# Patient Record
Sex: Female | Born: 1965
Health system: Southern US, Community
[De-identification: ages and names within clinical notes are randomized; demographics above are authoritative.]

## PROBLEM LIST (undated history)

## (undated) DIAGNOSIS — L0291 Cutaneous abscess, unspecified: Secondary | ICD-10-CM

## (undated) DIAGNOSIS — M797 Fibromyalgia: Secondary | ICD-10-CM

## (undated) DIAGNOSIS — F32A Depression, unspecified: Secondary | ICD-10-CM

## (undated) DIAGNOSIS — J439 Emphysema, unspecified: Secondary | ICD-10-CM

## (undated) DIAGNOSIS — N83209 Unspecified ovarian cyst, unspecified side: Secondary | ICD-10-CM

## (undated) DIAGNOSIS — Z87442 Personal history of urinary calculi: Secondary | ICD-10-CM

## (undated) DIAGNOSIS — M549 Dorsalgia, unspecified: Secondary | ICD-10-CM

## (undated) DIAGNOSIS — G43909 Migraine, unspecified, not intractable, without status migrainosus: Secondary | ICD-10-CM

## (undated) DIAGNOSIS — J449 Chronic obstructive pulmonary disease, unspecified: Secondary | ICD-10-CM

## (undated) DIAGNOSIS — M1990 Unspecified osteoarthritis, unspecified site: Secondary | ICD-10-CM

## (undated) DIAGNOSIS — F419 Anxiety disorder, unspecified: Secondary | ICD-10-CM

## (undated) DIAGNOSIS — F319 Bipolar disorder, unspecified: Secondary | ICD-10-CM

## (undated) DIAGNOSIS — R918 Other nonspecific abnormal finding of lung field: Secondary | ICD-10-CM

## (undated) DIAGNOSIS — G8929 Other chronic pain: Secondary | ICD-10-CM

## (undated) DIAGNOSIS — R0600 Dyspnea, unspecified: Secondary | ICD-10-CM

## (undated) DIAGNOSIS — N809 Endometriosis, unspecified: Secondary | ICD-10-CM

## (undated) DIAGNOSIS — J42 Unspecified chronic bronchitis: Secondary | ICD-10-CM

## (undated) DIAGNOSIS — F329 Major depressive disorder, single episode, unspecified: Secondary | ICD-10-CM

## (undated) DIAGNOSIS — J849 Interstitial pulmonary disease, unspecified: Secondary | ICD-10-CM

## (undated) HISTORY — DX: Other nonspecific abnormal finding of lung field: R91.8

## (undated) HISTORY — DX: Cutaneous abscess, unspecified: L02.91

## (undated) HISTORY — PX: OVARIAN CYST REMOVAL: SHX89

## (undated) HISTORY — DX: Chronic obstructive pulmonary disease, unspecified: J44.9

## (undated) HISTORY — DX: Dyspnea, unspecified: R06.00

## (undated) HISTORY — DX: Interstitial pulmonary disease, unspecified: J84.9

---

## 1991-11-02 HISTORY — PX: TUBAL LIGATION: SHX77

## 1991-11-02 HISTORY — PX: VAGINAL HYSTERECTOMY: SUR661

## 1991-11-02 HISTORY — PX: APPENDECTOMY: SHX54

## 1999-01-01 ENCOUNTER — Encounter: Payer: Self-pay | Admitting: Family Medicine

## 1999-01-01 ENCOUNTER — Ambulatory Visit (HOSPITAL_COMMUNITY): Admission: RE | Admit: 1999-01-01 | Discharge: 1999-01-01 | Payer: Self-pay | Admitting: Family Medicine

## 1999-03-20 ENCOUNTER — Other Ambulatory Visit: Admission: RE | Admit: 1999-03-20 | Discharge: 1999-03-20 | Payer: Self-pay | Admitting: Gynecology

## 1999-03-23 ENCOUNTER — Emergency Department (HOSPITAL_COMMUNITY): Admission: EM | Admit: 1999-03-23 | Discharge: 1999-03-23 | Payer: Self-pay

## 1999-08-07 ENCOUNTER — Ambulatory Visit (HOSPITAL_COMMUNITY): Admission: RE | Admit: 1999-08-07 | Discharge: 1999-08-07 | Payer: Self-pay | Admitting: Family Medicine

## 1999-08-07 ENCOUNTER — Encounter: Payer: Self-pay | Admitting: Family Medicine

## 1999-08-09 ENCOUNTER — Emergency Department (HOSPITAL_COMMUNITY): Admission: EM | Admit: 1999-08-09 | Discharge: 1999-08-09 | Payer: Self-pay | Admitting: Emergency Medicine

## 1999-08-27 ENCOUNTER — Ambulatory Visit (HOSPITAL_COMMUNITY): Admission: RE | Admit: 1999-08-27 | Discharge: 1999-08-27 | Payer: Self-pay | Admitting: Family Medicine

## 1999-08-27 ENCOUNTER — Encounter: Payer: Self-pay | Admitting: Family Medicine

## 1999-09-05 ENCOUNTER — Inpatient Hospital Stay (HOSPITAL_COMMUNITY): Admission: EM | Admit: 1999-09-05 | Discharge: 1999-09-07 | Payer: Self-pay | Admitting: Psychiatry

## 2001-08-03 ENCOUNTER — Emergency Department (HOSPITAL_COMMUNITY): Admission: EM | Admit: 2001-08-03 | Discharge: 2001-08-03 | Payer: Self-pay | Admitting: Emergency Medicine

## 2001-08-03 ENCOUNTER — Encounter: Payer: Self-pay | Admitting: Emergency Medicine

## 2001-08-04 ENCOUNTER — Emergency Department (HOSPITAL_COMMUNITY): Admission: EM | Admit: 2001-08-04 | Discharge: 2001-08-04 | Payer: Self-pay | Admitting: Emergency Medicine

## 2001-08-04 ENCOUNTER — Encounter: Payer: Self-pay | Admitting: *Deleted

## 2001-08-07 ENCOUNTER — Encounter (HOSPITAL_BASED_OUTPATIENT_CLINIC_OR_DEPARTMENT_OTHER): Payer: Self-pay | Admitting: General Surgery

## 2001-08-08 ENCOUNTER — Encounter (INDEPENDENT_AMBULATORY_CARE_PROVIDER_SITE_OTHER): Payer: Self-pay | Admitting: *Deleted

## 2001-08-08 ENCOUNTER — Ambulatory Visit (HOSPITAL_COMMUNITY): Admission: RE | Admit: 2001-08-08 | Discharge: 2001-08-09 | Payer: Self-pay | Admitting: General Surgery

## 2001-08-08 ENCOUNTER — Encounter (HOSPITAL_BASED_OUTPATIENT_CLINIC_OR_DEPARTMENT_OTHER): Payer: Self-pay | Admitting: General Surgery

## 2002-01-26 ENCOUNTER — Encounter: Admission: RE | Admit: 2002-01-26 | Discharge: 2002-01-26 | Payer: Self-pay | Admitting: Family Medicine

## 2002-01-26 ENCOUNTER — Encounter: Payer: Self-pay | Admitting: Family Medicine

## 2002-04-25 ENCOUNTER — Emergency Department (HOSPITAL_COMMUNITY): Admission: EM | Admit: 2002-04-25 | Discharge: 2002-04-25 | Payer: Self-pay | Admitting: Emergency Medicine

## 2002-07-18 ENCOUNTER — Other Ambulatory Visit: Admission: RE | Admit: 2002-07-18 | Discharge: 2002-07-18 | Payer: Self-pay | Admitting: Obstetrics and Gynecology

## 2002-07-27 ENCOUNTER — Encounter: Payer: Self-pay | Admitting: Emergency Medicine

## 2002-07-27 ENCOUNTER — Emergency Department (HOSPITAL_COMMUNITY): Admission: EM | Admit: 2002-07-27 | Discharge: 2002-07-28 | Payer: Self-pay | Admitting: Emergency Medicine

## 2003-01-13 ENCOUNTER — Emergency Department (HOSPITAL_COMMUNITY): Admission: EM | Admit: 2003-01-13 | Discharge: 2003-01-13 | Payer: Self-pay | Admitting: Emergency Medicine

## 2003-01-13 ENCOUNTER — Encounter: Payer: Self-pay | Admitting: Emergency Medicine

## 2003-07-12 ENCOUNTER — Encounter: Payer: Self-pay | Admitting: Obstetrics and Gynecology

## 2003-07-12 ENCOUNTER — Encounter: Admission: RE | Admit: 2003-07-12 | Discharge: 2003-07-12 | Payer: Self-pay | Admitting: Obstetrics and Gynecology

## 2004-02-07 ENCOUNTER — Ambulatory Visit (HOSPITAL_COMMUNITY): Admission: RE | Admit: 2004-02-07 | Discharge: 2004-02-07 | Payer: Self-pay | Admitting: Orthopedic Surgery

## 2004-03-03 ENCOUNTER — Emergency Department (HOSPITAL_COMMUNITY): Admission: EM | Admit: 2004-03-03 | Discharge: 2004-03-03 | Payer: Self-pay | Admitting: Family Medicine

## 2004-05-28 ENCOUNTER — Emergency Department (HOSPITAL_COMMUNITY): Admission: EM | Admit: 2004-05-28 | Discharge: 2004-05-28 | Payer: Self-pay | Admitting: Emergency Medicine

## 2004-08-15 ENCOUNTER — Emergency Department (HOSPITAL_COMMUNITY): Admission: EM | Admit: 2004-08-15 | Discharge: 2004-08-15 | Payer: Self-pay | Admitting: Emergency Medicine

## 2004-09-28 ENCOUNTER — Emergency Department (HOSPITAL_COMMUNITY): Admission: EM | Admit: 2004-09-28 | Discharge: 2004-09-29 | Payer: Self-pay | Admitting: Emergency Medicine

## 2004-10-02 ENCOUNTER — Ambulatory Visit: Payer: Self-pay | Admitting: Psychiatry

## 2004-10-02 ENCOUNTER — Inpatient Hospital Stay (HOSPITAL_COMMUNITY): Admission: RE | Admit: 2004-10-02 | Discharge: 2004-10-06 | Payer: Self-pay | Admitting: Psychiatry

## 2004-11-21 ENCOUNTER — Emergency Department (HOSPITAL_COMMUNITY): Admission: EM | Admit: 2004-11-21 | Discharge: 2004-11-21 | Payer: Self-pay | Admitting: Emergency Medicine

## 2004-12-09 ENCOUNTER — Emergency Department (HOSPITAL_COMMUNITY): Admission: EM | Admit: 2004-12-09 | Discharge: 2004-12-09 | Payer: Self-pay | Admitting: Emergency Medicine

## 2005-04-07 ENCOUNTER — Ambulatory Visit: Payer: Self-pay | Admitting: Internal Medicine

## 2005-04-13 ENCOUNTER — Ambulatory Visit: Payer: Self-pay | Admitting: Internal Medicine

## 2005-04-19 ENCOUNTER — Encounter (INDEPENDENT_AMBULATORY_CARE_PROVIDER_SITE_OTHER): Payer: Self-pay | Admitting: *Deleted

## 2005-04-19 ENCOUNTER — Ambulatory Visit (HOSPITAL_COMMUNITY): Admission: RE | Admit: 2005-04-19 | Discharge: 2005-04-19 | Payer: Self-pay | Admitting: Gastroenterology

## 2005-04-29 ENCOUNTER — Ambulatory Visit (HOSPITAL_COMMUNITY): Admission: RE | Admit: 2005-04-29 | Discharge: 2005-04-29 | Payer: Self-pay | Admitting: Gastroenterology

## 2005-05-31 ENCOUNTER — Ambulatory Visit (HOSPITAL_COMMUNITY): Admission: RE | Admit: 2005-05-31 | Discharge: 2005-05-31 | Payer: Self-pay | Admitting: Gastroenterology

## 2005-06-14 ENCOUNTER — Ambulatory Visit (HOSPITAL_COMMUNITY): Admission: RE | Admit: 2005-06-14 | Discharge: 2005-06-14 | Payer: Self-pay | Admitting: Gastroenterology

## 2005-08-17 ENCOUNTER — Ambulatory Visit: Payer: Self-pay | Admitting: Internal Medicine

## 2005-09-15 ENCOUNTER — Ambulatory Visit: Payer: Self-pay | Admitting: Internal Medicine

## 2005-11-16 ENCOUNTER — Ambulatory Visit: Payer: Self-pay | Admitting: Internal Medicine

## 2005-11-18 ENCOUNTER — Ambulatory Visit (HOSPITAL_COMMUNITY): Admission: RE | Admit: 2005-11-18 | Discharge: 2005-11-18 | Payer: Self-pay | Admitting: Internal Medicine

## 2005-11-22 ENCOUNTER — Ambulatory Visit: Payer: Self-pay | Admitting: Internal Medicine

## 2006-01-31 ENCOUNTER — Encounter: Admission: RE | Admit: 2006-01-31 | Discharge: 2006-01-31 | Payer: Self-pay | Admitting: Internal Medicine

## 2006-03-15 ENCOUNTER — Ambulatory Visit: Payer: Self-pay | Admitting: Internal Medicine

## 2006-03-21 ENCOUNTER — Ambulatory Visit: Payer: Self-pay | Admitting: *Deleted

## 2006-03-24 ENCOUNTER — Emergency Department (HOSPITAL_COMMUNITY): Admission: EM | Admit: 2006-03-24 | Discharge: 2006-03-24 | Payer: Self-pay | Admitting: Emergency Medicine

## 2006-04-08 ENCOUNTER — Emergency Department (HOSPITAL_COMMUNITY): Admission: EM | Admit: 2006-04-08 | Discharge: 2006-04-08 | Payer: Self-pay | Admitting: Emergency Medicine

## 2006-04-11 ENCOUNTER — Ambulatory Visit: Payer: Self-pay | Admitting: Internal Medicine

## 2006-04-16 ENCOUNTER — Emergency Department (HOSPITAL_COMMUNITY): Admission: EM | Admit: 2006-04-16 | Discharge: 2006-04-16 | Payer: Self-pay | Admitting: Family Medicine

## 2006-06-27 ENCOUNTER — Ambulatory Visit: Payer: Self-pay | Admitting: Internal Medicine

## 2006-06-27 ENCOUNTER — Ambulatory Visit (HOSPITAL_COMMUNITY): Admission: RE | Admit: 2006-06-27 | Discharge: 2006-06-27 | Payer: Self-pay | Admitting: Internal Medicine

## 2006-07-08 ENCOUNTER — Ambulatory Visit: Payer: Self-pay | Admitting: Internal Medicine

## 2006-08-24 ENCOUNTER — Ambulatory Visit: Payer: Self-pay | Admitting: Family Medicine

## 2006-08-29 ENCOUNTER — Ambulatory Visit (HOSPITAL_COMMUNITY): Admission: RE | Admit: 2006-08-29 | Discharge: 2006-08-29 | Payer: Self-pay | Admitting: Hematology and Oncology

## 2006-09-01 ENCOUNTER — Ambulatory Visit: Payer: Self-pay | Admitting: Internal Medicine

## 2006-11-01 HISTORY — PX: BREAST LUMPECTOMY: SHX2

## 2006-11-01 HISTORY — PX: BREAST BIOPSY: SHX20

## 2007-01-17 ENCOUNTER — Ambulatory Visit: Payer: Self-pay | Admitting: Internal Medicine

## 2007-01-24 ENCOUNTER — Encounter: Admission: RE | Admit: 2007-01-24 | Discharge: 2007-02-02 | Payer: Self-pay | Admitting: Internal Medicine

## 2007-02-17 ENCOUNTER — Ambulatory Visit: Payer: Self-pay | Admitting: Internal Medicine

## 2007-04-21 ENCOUNTER — Ambulatory Visit: Payer: Self-pay | Admitting: Internal Medicine

## 2007-06-14 ENCOUNTER — Ambulatory Visit: Payer: Self-pay | Admitting: Family Medicine

## 2007-06-15 ENCOUNTER — Encounter (INDEPENDENT_AMBULATORY_CARE_PROVIDER_SITE_OTHER): Payer: Self-pay | Admitting: Nurse Practitioner

## 2007-06-26 DIAGNOSIS — G4700 Insomnia, unspecified: Secondary | ICD-10-CM | POA: Insufficient documentation

## 2007-06-26 DIAGNOSIS — N3000 Acute cystitis without hematuria: Secondary | ICD-10-CM | POA: Insufficient documentation

## 2007-06-26 DIAGNOSIS — IMO0001 Reserved for inherently not codable concepts without codable children: Secondary | ICD-10-CM | POA: Insufficient documentation

## 2007-06-26 DIAGNOSIS — F319 Bipolar disorder, unspecified: Secondary | ICD-10-CM | POA: Insufficient documentation

## 2007-07-25 ENCOUNTER — Ambulatory Visit: Payer: Self-pay | Admitting: Internal Medicine

## 2007-08-02 HISTORY — PX: LAPAROSCOPIC CHOLECYSTECTOMY: SUR755

## 2007-09-19 ENCOUNTER — Ambulatory Visit: Payer: Self-pay | Admitting: Internal Medicine

## 2007-09-19 DIAGNOSIS — B9789 Other viral agents as the cause of diseases classified elsewhere: Secondary | ICD-10-CM | POA: Insufficient documentation

## 2007-09-19 DIAGNOSIS — N3946 Mixed incontinence: Secondary | ICD-10-CM | POA: Insufficient documentation

## 2007-09-19 LAB — CONVERTED CEMR LAB
Bilirubin Urine: NEGATIVE
Glucose, Urine, Semiquant: NEGATIVE
Ketones, urine, test strip: NEGATIVE
Nitrite: NEGATIVE
Protein, U semiquant: 30
Specific Gravity, Urine: >=1.03
Urobilinogen, UA: NEGATIVE
WBC Urine, dipstick: NEGATIVE
pH: 6

## 2007-09-20 ENCOUNTER — Encounter (INDEPENDENT_AMBULATORY_CARE_PROVIDER_SITE_OTHER): Payer: Self-pay | Admitting: Internal Medicine

## 2007-09-26 ENCOUNTER — Telehealth (INDEPENDENT_AMBULATORY_CARE_PROVIDER_SITE_OTHER): Payer: Self-pay | Admitting: Internal Medicine

## 2007-09-26 ENCOUNTER — Encounter (INDEPENDENT_AMBULATORY_CARE_PROVIDER_SITE_OTHER): Payer: Self-pay | Admitting: Internal Medicine

## 2007-10-06 ENCOUNTER — Encounter (INDEPENDENT_AMBULATORY_CARE_PROVIDER_SITE_OTHER): Payer: Self-pay | Admitting: Internal Medicine

## 2007-11-24 ENCOUNTER — Ambulatory Visit: Payer: Self-pay | Admitting: Internal Medicine

## 2008-02-12 ENCOUNTER — Ambulatory Visit: Payer: Self-pay | Admitting: Family Medicine

## 2008-02-13 ENCOUNTER — Ambulatory Visit: Payer: Self-pay | Admitting: *Deleted

## 2008-02-27 ENCOUNTER — Telehealth (INDEPENDENT_AMBULATORY_CARE_PROVIDER_SITE_OTHER): Payer: Self-pay | Admitting: Internal Medicine

## 2008-03-20 ENCOUNTER — Telehealth (INDEPENDENT_AMBULATORY_CARE_PROVIDER_SITE_OTHER): Payer: Self-pay | Admitting: Internal Medicine

## 2008-04-03 ENCOUNTER — Emergency Department (HOSPITAL_COMMUNITY): Admission: EM | Admit: 2008-04-03 | Discharge: 2008-04-03 | Payer: Self-pay | Admitting: Family Medicine

## 2008-08-21 ENCOUNTER — Ambulatory Visit: Payer: Self-pay | Admitting: *Deleted

## 2008-08-21 ENCOUNTER — Emergency Department (HOSPITAL_COMMUNITY): Admission: EM | Admit: 2008-08-21 | Discharge: 2008-08-21 | Payer: Self-pay | Admitting: Emergency Medicine

## 2008-08-21 ENCOUNTER — Inpatient Hospital Stay (HOSPITAL_COMMUNITY): Admission: RE | Admit: 2008-08-21 | Discharge: 2008-08-22 | Payer: Self-pay | Admitting: *Deleted

## 2008-09-01 HISTORY — PX: TRANSOBTURATOR SLING: SHX2571

## 2008-09-04 ENCOUNTER — Ambulatory Visit (HOSPITAL_COMMUNITY): Admission: RE | Admit: 2008-09-04 | Discharge: 2008-09-04 | Payer: Self-pay | Admitting: Obstetrics and Gynecology

## 2008-09-24 ENCOUNTER — Emergency Department (HOSPITAL_COMMUNITY): Admission: EM | Admit: 2008-09-24 | Discharge: 2008-09-24 | Payer: Self-pay | Admitting: Emergency Medicine

## 2009-04-07 ENCOUNTER — Emergency Department (HOSPITAL_COMMUNITY): Admission: EM | Admit: 2009-04-07 | Discharge: 2009-04-07 | Payer: Self-pay | Admitting: Family Medicine

## 2009-08-02 ENCOUNTER — Emergency Department (HOSPITAL_COMMUNITY): Admission: EM | Admit: 2009-08-02 | Discharge: 2009-08-02 | Payer: Self-pay | Admitting: Family Medicine

## 2009-10-29 ENCOUNTER — Ambulatory Visit (HOSPITAL_BASED_OUTPATIENT_CLINIC_OR_DEPARTMENT_OTHER): Admission: RE | Admit: 2009-10-29 | Discharge: 2009-10-29 | Payer: Self-pay | Admitting: General Surgery

## 2009-11-20 ENCOUNTER — Ambulatory Visit: Payer: Self-pay | Admitting: Oncology

## 2009-11-24 LAB — COMPREHENSIVE METABOLIC PANEL
ALT: 19 U/L (ref 0–35)
AST: 15 U/L (ref 0–37)
Albumin: 4.4 g/dL (ref 3.5–5.2)
Alkaline Phosphatase: 19 U/L — ABNORMAL LOW (ref 39–117)
BUN: 6 mg/dL (ref 6–23)
CO2: 26 mEq/L (ref 19–32)
Calcium: 9.4 mg/dL (ref 8.4–10.5)
Chloride: 103 mEq/L (ref 96–112)
Creatinine, Ser: 0.96 mg/dL (ref 0.40–1.20)
Glucose, Bld: 111 mg/dL — ABNORMAL HIGH (ref 70–99)
Potassium: 2.9 mEq/L — ABNORMAL LOW (ref 3.5–5.3)
Sodium: 138 mEq/L (ref 135–145)
Total Bilirubin: 0.3 mg/dL (ref 0.3–1.2)
Total Protein: 7.1 g/dL (ref 6.0–8.3)

## 2009-11-24 LAB — CBC WITH DIFFERENTIAL/PLATELET
BASO%: 0.5 % (ref 0.0–2.0)
Basophils Absolute: 0 10*3/uL (ref 0.0–0.1)
EOS%: 2.1 % (ref 0.0–7.0)
Eosinophils Absolute: 0.2 10*3/uL (ref 0.0–0.5)
HCT: 41.3 % (ref 34.8–46.6)
HGB: 14.1 g/dL (ref 11.6–15.9)
LYMPH%: 21.4 % (ref 14.0–49.7)
MCH: 32.1 pg (ref 25.1–34.0)
MCHC: 34.3 g/dL (ref 31.5–36.0)
MCV: 93.8 fL (ref 79.5–101.0)
MONO#: 0.4 10*3/uL (ref 0.1–0.9)
MONO%: 5 % (ref 0.0–14.0)
NEUT#: 6.1 10*3/uL (ref 1.5–6.5)
NEUT%: 71 % (ref 38.4–76.8)
Platelets: 375 10*3/uL (ref 145–400)
RBC: 4.4 10*6/uL (ref 3.70–5.45)
RDW: 13.6 % (ref 11.2–14.5)
WBC: 8.6 10*3/uL (ref 3.9–10.3)
lymph#: 1.8 10*3/uL (ref 0.9–3.3)

## 2010-01-14 ENCOUNTER — Ambulatory Visit: Payer: Self-pay | Admitting: Oncology

## 2010-01-19 LAB — COMPREHENSIVE METABOLIC PANEL
ALT: 12 U/L (ref 0–35)
AST: 11 U/L (ref 0–37)
Albumin: 4.3 g/dL (ref 3.5–5.2)
Alkaline Phosphatase: 20 U/L — ABNORMAL LOW (ref 39–117)
BUN: 8 mg/dL (ref 6–23)
CO2: 22 mEq/L (ref 19–32)
Calcium: 9.5 mg/dL (ref 8.4–10.5)
Chloride: 105 mEq/L (ref 96–112)
Creatinine, Ser: 0.91 mg/dL (ref 0.40–1.20)
Glucose, Bld: 93 mg/dL (ref 70–99)
Potassium: 4 mEq/L (ref 3.5–5.3)
Sodium: 139 mEq/L (ref 135–145)
Total Bilirubin: 0.3 mg/dL (ref 0.3–1.2)
Total Protein: 6.7 g/dL (ref 6.0–8.3)

## 2010-01-19 LAB — CBC WITH DIFFERENTIAL/PLATELET
BASO%: 0.6 % (ref 0.0–2.0)
Basophils Absolute: 0 10*3/uL (ref 0.0–0.1)
EOS%: 3.5 % (ref 0.0–7.0)
Eosinophils Absolute: 0.2 10*3/uL (ref 0.0–0.5)
HCT: 37.8 % (ref 34.8–46.6)
HGB: 13.2 g/dL (ref 11.6–15.9)
LYMPH%: 29.2 % (ref 14.0–49.7)
MCH: 32.6 pg (ref 25.1–34.0)
MCHC: 34.9 g/dL (ref 31.5–36.0)
MCV: 93.5 fL (ref 79.5–101.0)
MONO#: 0.4 10*3/uL (ref 0.1–0.9)
MONO%: 6.5 % (ref 0.0–14.0)
NEUT#: 4.2 10*3/uL (ref 1.5–6.5)
NEUT%: 60.2 % (ref 38.4–76.8)
Platelets: 337 10*3/uL (ref 145–400)
RBC: 4.04 10*6/uL (ref 3.70–5.45)
RDW: 13.3 % (ref 11.2–14.5)
WBC: 6.9 10*3/uL (ref 3.9–10.3)
lymph#: 2 10*3/uL (ref 0.9–3.3)

## 2010-01-31 ENCOUNTER — Emergency Department (HOSPITAL_COMMUNITY): Admission: EM | Admit: 2010-01-31 | Discharge: 2010-01-31 | Payer: Self-pay | Admitting: Emergency Medicine

## 2010-02-16 ENCOUNTER — Ambulatory Visit: Payer: Self-pay | Admitting: Oncology

## 2010-03-19 ENCOUNTER — Ambulatory Visit: Payer: Self-pay | Admitting: Oncology

## 2010-04-11 ENCOUNTER — Emergency Department (HOSPITAL_COMMUNITY): Admission: EM | Admit: 2010-04-11 | Discharge: 2010-04-11 | Payer: Self-pay | Admitting: Emergency Medicine

## 2010-04-23 ENCOUNTER — Ambulatory Visit: Payer: Self-pay | Admitting: Oncology

## 2010-05-28 ENCOUNTER — Ambulatory Visit: Payer: Self-pay | Admitting: Oncology

## 2010-06-01 HISTORY — PX: LAPAROSCOPIC LYSIS OF ADHESIONS: SHX5905

## 2010-06-01 LAB — CBC WITH DIFFERENTIAL/PLATELET
BASO%: 0.6 % (ref 0.0–2.0)
Basophils Absolute: 0 10*3/uL (ref 0.0–0.1)
EOS%: 2.1 % (ref 0.0–7.0)
Eosinophils Absolute: 0.2 10*3/uL (ref 0.0–0.5)
HCT: 39.5 % (ref 34.8–46.6)
HGB: 13.6 g/dL (ref 11.6–15.9)
LYMPH%: 29.7 % (ref 14.0–49.7)
MCH: 31.8 pg (ref 25.1–34.0)
MCHC: 34.5 g/dL (ref 31.5–36.0)
MCV: 92.4 fL (ref 79.5–101.0)
MONO#: 0.5 10*3/uL (ref 0.1–0.9)
MONO%: 6.6 % (ref 0.0–14.0)
NEUT#: 4.7 10*3/uL (ref 1.5–6.5)
NEUT%: 61 % (ref 38.4–76.8)
Platelets: 350 10*3/uL (ref 145–400)
RBC: 4.28 10*6/uL (ref 3.70–5.45)
RDW: 13.3 % (ref 11.2–14.5)
WBC: 7.8 10*3/uL (ref 3.9–10.3)
lymph#: 2.3 10*3/uL (ref 0.9–3.3)

## 2010-06-01 LAB — COMPREHENSIVE METABOLIC PANEL
ALT: 10 U/L (ref 0–35)
AST: 10 U/L (ref 0–37)
Albumin: 4.4 g/dL (ref 3.5–5.2)
Alkaline Phosphatase: 21 U/L — ABNORMAL LOW (ref 39–117)
BUN: 11 mg/dL (ref 6–23)
CO2: 22 mEq/L (ref 19–32)
Calcium: 9.5 mg/dL (ref 8.4–10.5)
Chloride: 105 mEq/L (ref 96–112)
Creatinine, Ser: 0.9 mg/dL (ref 0.40–1.20)
Glucose, Bld: 88 mg/dL (ref 70–99)
Potassium: 3.8 mEq/L (ref 3.5–5.3)
Sodium: 140 mEq/L (ref 135–145)
Total Bilirubin: 0.2 mg/dL — ABNORMAL LOW (ref 0.3–1.2)
Total Protein: 6.5 g/dL (ref 6.0–8.3)

## 2010-06-18 ENCOUNTER — Ambulatory Visit (HOSPITAL_COMMUNITY): Admission: RE | Admit: 2010-06-18 | Discharge: 2010-06-18 | Payer: Self-pay | Admitting: Obstetrics and Gynecology

## 2010-11-22 ENCOUNTER — Encounter: Payer: Self-pay | Admitting: Internal Medicine

## 2010-11-22 ENCOUNTER — Encounter: Payer: Self-pay | Admitting: Occupational Therapy

## 2010-12-03 NOTE — Letter (Signed)
Summary: FAXED RECORDS TO [facility] 10/06/07  FAXED RECORDS TO [facility] 10/06/07   Imported By: Silvio Pate Stanislawscyk 10/06/2007 16:25:38  _____________________________________________________________________  External Attachment:    Type:   Image     Comment:   External Document

## 2010-12-03 NOTE — Assessment & Plan Note (Signed)
Summary: 6 WEEK FU ON MEDICATION////KT   Vital Signs:  Patient Profile:   45 Years Old Female Weight:      182 pounds Pulse rate:   84 / minute Pulse rhythm:   regular Resp:     16 per minute BP sitting:   96 / 68  (left arm) Cuff size:   regular  Pt. in pain?   yes    Location:   headache    Intensity:   8  Vitals Entered By: Vesta Mixer CMA (September 19, 2007 2:09 PM)                  Chief Complaint:  6 week f/u, also N & V x 2weeks, st, and cough.  History of Present Illness: 1.  Fibromyalgia:  Mild improvement with increased Lyrica.  Has not looked into pool exercises--may have access to pool as friend is a member.  2.  3 days ago with nausea and vomiting, cough-nonproductive, no fevers, +chills, +headache.  No diarrhea.  Does have myalgias more than usual status.  Tylenol Cold does not help.  Drinking regualar tea.  Feels like she can't keep anything down.  Urinated last this morning--dark yellow, good amt.  3.  For past month, when coughs or stands up, loses control of urine.  No dysuria, +urine frequency.  Current Allergies (reviewed today): ! CORTISONE ACETATE (CORTISONE ACETATE)      Physical Exam  Eyes:     No corneal or conjunctival inflammation noted. EOMI. Perrla. Funduscopic exam benign, without hemorrhages, exudates or papilledema. Vision grossly normal. Ears:     External ear exam shows no significant lesions or deformities.  Otoscopic examination reveals clear canals, tympanic membranes are intact bilaterally without bulging, retraction, inflammation or discharge. Hearing is grossly normal bilaterally. Nose:     mucosal erythema and mucosal edema.   Mouth:     Mouth breathing--mouth is dry.  No pharyngeal exudates. Neck:     No deformities, masses, or tenderness noted. Lungs:     Normal respiratory effort, chest expands symmetrically. Lungs are clear to auscultation, no crackles or wheezes. Heart:     Normal rate and regular rhythm. S1 and  S2 normal without gallop, murmur, click, rub or other extra sounds. Abdomen:     Bowel sounds positive,abdomen soft and non-tender without masses, organomegaly or hernias noted.    Impression & Recommendations:  Problem # 1:  VIRAL INFECTION (ICD-079.99) Phenergan suppositories q6h as needed Small amts of clear liquids regularly. Call if unable to keep fluids down over next 24 hours.  Problem # 2:  MIXED INCONTINENCE URGE AND STRESS (EAV-409.81)  Orders: UA Dipstick w/o Micro (19147) T-Culture, Urine ([id number])   Problem # 3:  FIBROMYALGIA (ICD-729.1) Continue on current meds--pt. needs to get started on regular exercise program, hopefully with pool. Her updated medication list for this problem includes:    Carisoprodol 350 Mg Tabs (Carisoprodol) .Marland Kitchen... 1 by mouth three times a day as needed muscle painb   Complete Medication List: 1)  Abilify 10 Mg Tabs (Aripiprazole) 2)  Seroquel 200 Mg Tabs (Quetiapine fumarate) .... Take 1 tablet by mouth every morning take 1 tablet at bedtime 3)  Lexapro 20 Mg Tabs (Escitalopram oxalate) .... Take 1 tablet by mouth once a day 4)  Lamictal 200 Mg Tabs (Lamotrigine) .Marland Kitchen.. 1 tablet by mouth daily 5)  Ativan 2 Mg/ml Soln (Lorazepam) 6)  Lyrica 75 Mg Caps (Pregabalin) .Marland Kitchen.. 1 cap by mouth bid 7)  Carisoprodol 350 Mg  Tabs (Carisoprodol) .Marland Kitchen.. 1 by mouth three times a day as needed muscle painb 8)  Promethegan 25 Mg Supp (Promethazine hcl) .Marland Kitchen.. 1 pr q6h as needed for nausea and vomiting.   Patient Instructions: 1)  Use a suppository upon reaching home 2)  Start sipping on clear liquids--small amts. frequently 3)  Call if unable to keep fluids down over next 24 hours. 4)  Follow up in 2 months after starting exercises-call for appt.    Prescriptions: PROMETHEGAN 25 MG  SUPP (PROMETHAZINE HCL) 1 pr q6h as needed for nausea and vomiting.  #6 x 0   Entered and Authorized by:   Julieanne Manson MD   Signed by:   Julieanne Manson MD on  09/19/2007   Method used:   Print then Give to Patient   RxID:   [id number]  ] Laboratory Results   Urine Tests    Routine Urinalysis   Color: yellow Appearance: Clear Glucose: negative   (Normal Range: Negative) Bilirubin: negative   (Normal Range: Negative) Ketone: negative   (Normal Range: Negative) Spec. Gravity: >=1.030   (Normal Range: 1.003-1.035) Blood: large   (Normal Range: Negative) pH: 6.0   (Normal Range: 5.0-8.0) Protein: 30   (Normal Range: Negative) Urobilinogen: negative   (Normal Range: 0-1) Nitrite: negative   (Normal Range: Negative) Leukocyte Esterace: negative   (Normal Range: Negative)        Appended Document: 6 WEEK FU ON MEDICATION////KT Pt. requested cough medicine. Instructed to keep 6 hours between Rondec and Phenergan suppositories.   Clinical Lists Changes  Medications: Added new medication of RONDEC DM 12.03-04-14 MG/5ML  SYRP (PHENYLEPHRINE-CHLORPHEN-DM) 1 tsp by mouth q 6h as needed cough - Signed Rx of RONDEC DM 12.03-04-14 MG/5ML  SYRP (PHENYLEPHRINE-CHLORPHEN-DM) 1 tsp by mouth q 6h as needed cough;  #150 ml x 0;  Signed;  Entered by: Julieanne Manson MD;  Authorized by: Julieanne Manson MD;  Method used: Print then Give to Patient    Prescriptions: RONDEC DM 12.03-04-14 MG/5ML  SYRP (PHENYLEPHRINE-CHLORPHEN-DM) 1 tsp by mouth q 6h as needed cough  #150 ml x 0   Entered and Authorized by:   Julieanne Manson MD   Signed by:   Julieanne Manson MD on 09/19/2007   Method used:   Print then Give to Patient   RxID:   [id number]

## 2010-12-03 NOTE — Assessment & Plan Note (Signed)
Summary: 2 MONTH FU ON FIBROMYALGA///KT   Vital Signs:  Patient Profile:   45 Years Old Female Weight:      182 pounds Temp:     98.0 degrees F Pulse rate:   88 / minute Pulse rhythm:   regular Resp:     20 per minute BP sitting:   118 / 74  (left arm) Cuff size:   regular  Pt. in pain?   yes    Intensity:   5  Vitals Entered By: Vesta Mixer CMA (November 24, 2007 11:12 AM)                  Chief Complaint:  here for a fibromyalgia check and passes urine when she coughs or stands up.  History of Present Illness: 1.  Mixed urinary incontinence:  does have dry mouth with current meds.  2.  Fibromyalgia:  Started water exercises at [employer] with someone in family with a membership.  Current Allergies (reviewed today): ! CORTISONE ACETATE (CORTISONE ACETATE)        Impression & Recommendations:  Problem # 1:  MIXED INCONTINENCE URGE AND STRESS (ICD-788.33) Detrol LA 2 mg daily. Follow up in 3 months.  Problem # 2:  FIBROMYALGIA (ICD-729.1) continue with current meds and pool exercises. Her updated medication list for this problem includes:    Carisoprodol 350 Mg Tabs (Carisoprodol) .Marland Kitchen... 1 by mouth three times a day as needed muscle painb   Complete Medication List: 1)  Abilify 10 Mg Tabs (Aripiprazole) 2)  Seroquel 200 Mg Tabs (Quetiapine fumarate) .... Take 1 tablet by mouth every morning take 1 tablet at bedtime 3)  Lexapro 20 Mg Tabs (Escitalopram oxalate) .... Take 1 tablet by mouth once a day 4)  Lamictal 200 Mg Tabs (Lamotrigine) .Marland Kitchen.. 1 tablet by mouth daily 5)  Ativan 2 Mg/ml Soln (Lorazepam) 6)  Lyrica 75 Mg Caps (Pregabalin) .Marland Kitchen.. 1 cap by mouth bid 7)  Carisoprodol 350 Mg Tabs (Carisoprodol) .Marland Kitchen.. 1 by mouth three times a day as needed muscle painb 8)  Promethegan 25 Mg Supp (Promethazine hcl) .Marland Kitchen.. 1 pr q6h as needed for nausea and vomiting. 9)  Rondec Dm 12.03-04-14 Mg/8ml Syrp (Phenylephrine-chlorphen-dm) .Marland Kitchen.. 1 tsp by mouth q 6h as  needed cough 10)  Detrol La 2 Mg Cp24 (Tolterodine tartrate) .Marland Kitchen.. 1 tab by mouth q a. m.   Patient Instructions: 1)  Please schedule a follow-up appointment in 3 months.  Dr. Smitty Pluck for appt    Prescriptions: DETROL LA 2 MG  CP24 (TOLTERODINE TARTRATE) 1 tab by mouth q a. m.  #30 x 4   Entered and Authorized by:   Julieanne Manson MD   Signed by:   Julieanne Manson MD on 11/24/2007   Method used:   Print then Give to Patient   RxID:   [id number]  ]

## 2010-12-03 NOTE — Assessment & Plan Note (Signed)
Summary: 1 MONTH FU ON [name] AND MEDS//KT   Vital Signs:  Patient Profile:   45 Years Old Female Weight:      183.5 pounds Temp:     97.2 degrees F Pulse rate:   88 / minute Pulse rhythm:   regular Resp:     20 per minute BP sitting:   124 / 80  (left arm) Cuff size:   regular  Pt. in pain?   yes    Location:   all over    Intensity:   7  Vitals Entered By: Vesta Mixer CMA (July 25, 2007 1:59 PM)              Is Patient Diabetic? No     Chief Complaint:  pain management hurts unbearably all over.  History of Present Illness: 1.  Fibromyalgia: Pain not well controlled.  Mainly in backs, hips, and legs.  For some reason, refills for Lyrica 75 mg did not get transferred to [facility] and she was placed back on 50 two times a day.   Carisoprodol 350 mg three times a day help as well.  Takes last one at bedtime with Tylenol PM.  Had an old presciption through Dr. Holley Raring and filled beginning of this month--had 90 pills and almost out.  Current Allergies (reviewed today): ! CORTISONE ACETATE (CORTISONE ACETATE)    Risk Factors:  Tobacco use:  current    Cigarettes:  Yes -- 1 pack(s) per day    Physical Exam  Lungs:     CTA Heart:     normal rate and regular rhythm.   Msk:     Tender everywhere--paraspinous musculature, pectoral, pes anserine, epicondylar areas bilaterally    Impression & Recommendations:  Problem # 1:  FIBROMYALGIA (ICD-729.1) Increase Lyrica to 75 mg bid Her updated medication list for this problem includes:    Carisoprodol 350 Mg Tabs (Carisoprodol) .Marland Kitchen... 1 by mouth three times a day as needed muscle painb Follow up 6 weeks. Recommend swimming exercise if possible  Complete Medication List: 1)  Abilify 10 Mg Tabs (Aripiprazole) 2)  Seroquel 200 Mg Tabs (Quetiapine fumarate) .... Take 1 tablet by mouth every morning take 1 tablet at bedtime 3)  Lexapro 20 Mg Tabs (Escitalopram oxalate) .... Take 1 tablet by  mouth once a day 4)  Lamictal 200 Mg Tabs (Lamotrigine) .Marland Kitchen.. 1 tablet by mouth daily 5)  Ativan 2 Mg/ml Soln (Lorazepam) 6)  Lyrica 75 Mg Caps (Pregabalin) .Marland Kitchen.. 1 cap by mouth bid 7)  Carisoprodol 350 Mg Tabs (Carisoprodol) .Marland Kitchen.. 1 by mouth three times a day as needed muscle painb   Patient Instructions: 1)  Call for follow up in 6 weeks 2)  Hold onto 50 mg Lyrica caps for now    Prescriptions: CARISOPRODOL 350 MG  TABS (CARISOPRODOL) 1 by mouth three times a day as needed muscle painb  #90 x 1   Entered and Authorized by:   Julieanne Manson MD   Signed by:   Julieanne Manson MD on 07/25/2007   Method used:   Print then Give to Patient   RxID:   [id number] LYRICA 75 MG  CAPS (PREGABALIN) 1 cap by mouth bid  #60 x 6   Entered and Authorized by:   Julieanne Manson MD   Signed by:   Julieanne Manson MD on 07/25/2007   Method used:   Print then Give to Patient   RxID:   [id number]  ]

## 2010-12-03 NOTE — Progress Notes (Signed)
Summary: Lyrica Refill  Phone Note Call from Patient Call back at Home Phone [phone]   Caller: Patient Call For: [phone] Reason for Call: Refill Medication Summary of Call: The patient needs more refills medication from her Lyrica 75 mg medication.  [facility] [address] [id number] She called the pharmacy to send [place] the request. Dr. Delrae Alfred Initial call taken by: Manon Hilding,  Mar 20, 2008 9:11 AM  Follow-up for Phone Call        med filled and sent to the pharmacy. notify pt Follow-up by: Lehman Prom FNP,  Mar 20, 2008 2:25 PM  Additional Follow-up for Phone Call Additional follow up Details #1::        Did it get called in?  It can't send via e-script can it? Additional Follow-up by: Vesta Mixer CMA,  Mar 20, 2008 4:02 PM    Additional Follow-up for Phone Call Additional follow up Details #2::    Umm.  I've always sent it before.  i guess i will see if it bounces back. thanks  ****Right you are..   It came back.  Rx printed and on your desk to be faxed** Mar 21, 2008  1:07 PM   Follow-up by: Lehman Prom FNP,  Mar 20, 2008 4:59 PM  Additional Follow-up for Phone Call Additional follow up Details #3:: Details for Additional Follow-up Action Taken: Rx faxed to [facility] [address]. Additional Follow-up by: Vesta Mixer CMA,  Mar 21, 2008 2:49 PM    Prescriptions: LYRICA 75 MG  CAPS (PREGABALIN) 1 cap by mouth two times a day  #60 x 5   Entered and Authorized by:   Lehman Prom FNP   Signed by:   Lehman Prom FNP on 03/21/2008   Method used:   Printed then faxed to ...       [address]       [address]       [place], [place]  [address]       Ph: [phone]       Fax: [phone]   RxID:   [id number]

## 2010-12-03 NOTE — Progress Notes (Signed)
Summary: ? about Lyrica  Phone Note Call from Patient Call back at [phone]   Caller: Patient Summary of Call: Pt trying to get prescription insurance and she is on Lyrica.  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 She knows she does not have it, just needs to know if that is something that is prescribed for RA Initial call taken by: Vesta Mixer CMA,  February 27, 2008 3:41 PM  Follow-up for Phone Call        No, it is not normally something used for RA. Follow-up by: Julieanne Manson MD,  February 28, 2008 12:12 PM  Additional Follow-up for Phone Call Additional follow up Details #1::        Left message on answering machine for pt to return call  Additional Follow-up by: Ashlyn Cartner SMA,  February 29, 2008 9:06 AM    Additional Follow-up for Phone Call Additional follow up Details #2::    Pt aware this is not normally rx for RA Follow-up by: Vesta Mixer CMA,  February 29, 2008 12:40 PM

## 2010-12-03 NOTE — Letter (Signed)
Summary: *HSN Results Follow up  [facility]  [address] [place], [place] [address]   Phone: [phone]  Fax: [phone]      09/26/2007   [name] [address] [place], [place]  [address]   Dear  Ms. Ivy Bibian,                            ____S.Drinkard,FNP   ____D. Gore,FNP       ____B. McPherson,MD   ____V. Rankins,MD    ____E. Aiva Miskell,MD    ____N. Daphine Deutscher, FNP  ____D. Reche Dixon, MD    ____K. Philipp Deputy, MD    ____Other     This letter is to inform you that your recent test(s):  _______Pap Smear    __X_____Lab Test     _______X-ray    ___X____ is within acceptable limits  _______ requires a medication change  _______ requires a follow-up lab visit  _______ requires a follow-up visit with your provider   Comments:  Urine did not show infection.  We can discuss treatment for your urinary problems at your next follow up--or you can call for an earlier appt. if you find the problem no longer tolerable.       _________________________________________________________ If you have any questions, please contact our office                     Sincerely,  Julieanne Manson MD [facility]

## 2010-12-03 NOTE — Assessment & Plan Note (Signed)
Summary: 2209 PT/COUGH/ FEELING REALLY VERY SICK//GK   Vital Signs:  Patient Profile:   45 Years Old Female Height:     66 inches Weight:      189 pounds BMI:     30.62 BSA:     1.95 Temp:     98.7 degrees F oral Pulse rate:   80 / minute Pulse rhythm:   regular Resp:     20 per minute BP sitting:   110 / 80  (left arm)  Vitals Entered By: Levon Hedger (February 12, 2008 12:15 PM)  Menstrual History: LMP - Character: hysterctomy             Is Patient Diabetic? No  Does patient need assistance? Ambulation Normal Comments pt states that none of her medications have changed, and she did not bring her medications with her today.     Chief Complaint:  Cough x 1 month and fatigued.  History of Present Illness: Sees Dr.Mulberry. Here for acute care visit for productive cough x 4 weeks. Has had some SOB with cough.No wheezing. No fevers. No runnynose/sorethroat/nasal congestion. Has tried OTC nyquil/robitussinD. Pt says has been "years" since on abx.  Has chronic pain issues with her fibromyalgia. Has been using tanning booth. Says makes herfibromyalgia feel better.    Updated Prior Medication List: ABILIFY 10 MG  TABS (ARIPIPRAZOLE)  SEROQUEL 200 MG  TABS (QUETIAPINE FUMARATE) Take 1 tablet by mouth every morning Take 1 tablet at bedtime LEXAPRO 20 MG  TABS (ESCITALOPRAM OXALATE) Take 1 tablet by mouth once a day LAMICTAL 200 MG  TABS (LAMOTRIGINE) 1 tablet by mouth daily ATIVAN 2 MG/ML  SOLN (LORAZEPAM)  LYRICA 75 MG  CAPS (PREGABALIN) 1 cap by mouth bid CARISOPRODOL 350 MG  TABS (CARISOPRODOL) 1 by mouth three times a day as needed muscle painb PROMETHEGAN 25 MG  SUPP (PROMETHAZINE HCL) 1 pr q6h as needed for nausea and vomiting. RONDEC DM 12.03-04-14 MG/5ML  SYRP (PHENYLEPHRINE-CHLORPHEN-DM) 1 tsp by mouth q 6h as needed cough DETROL LA 2 MG  CP24 (TOLTERODINE TARTRATE) 1 tab by mouth q a. m.  Current Allergies (reviewed today): ! CORTISONE ACETATE (CORTISONE  ACETATE)  Past Medical History:    Reviewed history from 06/26/2007 and no changes required:       Current Problems:        DISORDER, BIPOLAR NOS (ICD-296.80)       INSOMNIA (ICD-780.52)       FIBROMYALGIA (ICD-729.1)       CYSTITIS, ACUTE (ICD-595.0)         Past Surgical History:    Reviewed history from 06/26/2007 and no changes required:       Hysterectomy    Risk Factors:  Drug use:  no Caffeine use:  5+ drinks per day Alcohol use:  no Seatbelt use:  100 % Sun Exposure:  occasionally    Physical Exam  General:     Well-developed,well-nourished,in no acute distress; alert,appropriate and cooperative throughout examination Eyes:     pupils equal, pupils round, pupils reactive to light, and no injection.   Ears:     External ear exam shows no significant lesions or deformities.  Otoscopic examination reveals clear canals, tympanic membranes are intact bilaterally without bulging, retraction, inflammation or discharge. Hearing is grossly normal bilaterally. Nose:     External nasal examination shows no deformity or inflammation. Nasal mucosa are pink and moist without lesions or exudates. Mouth:     Oral mucosa and oropharynx without lesions or exudates.  Teeth in good repair.pharynx pink and moist, no erythema, and no exudates.   Neck:     no cervical lymphadenopathy.   Lungs:     Normal respiratory effort, chest expands symmetrically. Lungs are clear to auscultation, no crackles or wheezes. Heart:     Normal rate and regular rhythm. S1 and S2 normal without gallop, murmur, click, rub or other extra sounds.    Impression & Recommendations:  Problem # 1:  BRONCHITIS-ACUTE (ICD-466.0)  The following medications were removed from the medication list:    Rondec Dm 12.03-04-14 Mg/74ml Syrp (Phenylephrine-chlorphen-dm) .Marland Kitchen... 1 tsp by mouth q 6h as needed cough  Her updated medication list for this problem includes:    Zithromax Z-pak 250 Mg Tabs (Azithromycin) .Marland Kitchen...  Take 2 tablets by mouth on day1 then 1 tablet each day on days 2 thru 5    Mucinex Dm 30-600 Mg Tb12 (Dextromethorphan-guaifenesin) .Marland Kitchen... Take one tablet by mouth every 12 hours as needed for cough   Complete Medication List: 1)  Abilify 10 Mg Tabs (Aripiprazole) 2)  Seroquel 200 Mg Tabs (Quetiapine fumarate) .... Take 1 tablet by mouth every morning take 1 tablet at bedtime 3)  Lexapro 20 Mg Tabs (Escitalopram oxalate) .... Take 1 tablet by mouth once a day 4)  Lamictal 200 Mg Tabs (Lamotrigine) .Marland Kitchen.. 1 tablet by mouth daily 5)  Ativan 2 Mg/ml Soln (Lorazepam) 6)  Lyrica 75 Mg Caps (Pregabalin) .Marland Kitchen.. 1 cap by mouth bid 7)  Carisoprodol 350 Mg Tabs (Carisoprodol) .Marland Kitchen.. 1 by mouth three times a day as needed muscle painb 8)  Promethegan 25 Mg Supp (Promethazine hcl) .Marland Kitchen.. 1 pr q6h as needed for nausea and vomiting. 9)  Detrol La 2 Mg Cp24 (Tolterodine tartrate) .Marland Kitchen.. 1 tab by mouth q a. m. 10)  Zithromax Z-pak 250 Mg Tabs (Azithromycin) .... Take 2 tablets by mouth on day1 then 1 tablet each day on days 2 thru 5 11)  Mucinex Dm 30-600 Mg Tb12 (Dextromethorphan-guaifenesin) .... Take one tablet by mouth every 12 hours as needed for cough   Patient Instructions: 1)  Please schedule a follow-up appointment as needed.    Prescriptions: MUCINEX DM 30-600 MG  TB12 (DEXTROMETHORPHAN-GUAIFENESIN) Take one tablet by mouth every 12 hours as needed for cough  #30 x 0   Entered and Authorized by:   Beverley Fiedler MD   Signed by:   Beverley Fiedler MD on 02/12/2008   Method used:   Print then Give to Patient   RxID:   [id number] ZITHROMAX Z-PAK 250 MG  TABS (AZITHROMYCIN) Take 2 tablets by mouth on day1 then 1 tablet each day on days 2 thru 5  #6 x 0   Entered and Authorized by:   Beverley Fiedler MD   Signed by:   Beverley Fiedler MD on 02/12/2008   Method used:   Print then Give to Patient   RxID:   [id number]  ]  Vital Signs:  Patient Profile:   45 Years Old Female Height:      66 inches Weight:      189 pounds BMI:     30.62 BSA:     1.95 Temp:     98.7 degrees F oral Pulse rate:   80 / minute Pulse rhythm:   regular Resp:     20 per minute BP sitting:   110 / 80

## 2010-12-03 NOTE — Letter (Signed)
Summary: [facility]ed records to [facility] 12./5/08  faxed records to [facility] 12./5/08   Imported By: Arta Bruce 10/06/2007 16:59:23  _____________________________________________________________________  External Attachment:    Type:   Image     Comment:   External Document

## 2011-01-15 LAB — COMPREHENSIVE METABOLIC PANEL
ALT: 22 U/L (ref 0–35)
AST: 19 U/L (ref 0–37)
Albumin: 4.1 g/dL (ref 3.5–5.2)
Alkaline Phosphatase: 24 U/L — ABNORMAL LOW (ref 39–117)
BUN: 8 mg/dL (ref 6–23)
CO2: 30 mEq/L (ref 19–32)
Calcium: 9.2 mg/dL (ref 8.4–10.5)
Chloride: 99 mEq/L (ref 96–112)
Creatinine, Ser: 0.88 mg/dL (ref 0.4–1.2)
GFR calc Af Amer: >60 mL/min (ref >60)
GFR calc non Af Amer: >60 mL/min (ref >60)
Glucose, Bld: 94 mg/dL (ref 70–99)
Potassium: 3.9 mEq/L (ref 3.5–5.1)
Sodium: 134 mEq/L — ABNORMAL LOW (ref 135–145)
Total Bilirubin: 0.4 mg/dL (ref 0.3–1.2)
Total Protein: 6.9 g/dL (ref 6.0–8.3)

## 2011-01-15 LAB — CBC
HCT: 41.7 % (ref 36.0–46.0)
Hemoglobin: 14.2 g/dL (ref 12.0–15.0)
MCH: 32.1 pg (ref 26.0–34.0)
MCHC: 34.1 g/dL (ref 30.0–36.0)
MCV: 94.2 fL (ref 78.0–100.0)
Platelets: 348 10*3/uL (ref 150–400)
RBC: 4.42 MIL/uL (ref 3.87–5.11)
RDW: 13.4 % (ref 11.5–15.5)
WBC: 8.2 10*3/uL (ref 4.0–10.5)

## 2011-01-15 LAB — SURGICAL PCR SCREEN
MRSA, PCR: NEGATIVE
Staphylococcus aureus: NEGATIVE

## 2011-01-18 LAB — COMPREHENSIVE METABOLIC PANEL
ALT: 13 U/L (ref 0–35)
AST: 14 U/L (ref 0–37)
Albumin: 3.8 g/dL (ref 3.5–5.2)
Alkaline Phosphatase: 22 U/L — ABNORMAL LOW (ref 39–117)
BUN: 12 mg/dL (ref 6–23)
CO2: 23 mEq/L (ref 19–32)
Calcium: 9.1 mg/dL (ref 8.4–10.5)
Chloride: 106 mEq/L (ref 96–112)
Creatinine, Ser: 0.88 mg/dL (ref 0.4–1.2)
GFR calc Af Amer: >60 mL/min (ref >60)
GFR calc non Af Amer: >60 mL/min (ref >60)
Glucose, Bld: 104 mg/dL — ABNORMAL HIGH (ref 70–99)
Potassium: 3.3 mEq/L — ABNORMAL LOW (ref 3.5–5.1)
Sodium: 138 mEq/L (ref 135–145)
Total Bilirubin: 0.4 mg/dL (ref 0.3–1.2)
Total Protein: 6.5 g/dL (ref 6.0–8.3)

## 2011-01-18 LAB — DIFFERENTIAL
Basophils Absolute: 0 10*3/uL (ref 0.0–0.1)
Basophils Relative: 1 % (ref 0–1)
Eosinophils Absolute: 0.1 10*3/uL (ref 0.0–0.7)
Eosinophils Relative: 2 % (ref 0–5)
Lymphocytes Relative: 23 % (ref 12–46)
Lymphs Abs: 1.8 10*3/uL (ref 0.7–4.0)
Monocytes Absolute: 0.5 10*3/uL (ref 0.1–1.0)
Monocytes Relative: 6 % (ref 3–12)
Neutro Abs: 5.4 10*3/uL (ref 1.7–7.7)
Neutrophils Relative %: 69 % (ref 43–77)

## 2011-01-18 LAB — LIPASE, BLOOD: Lipase: 21 U/L (ref 11–59)

## 2011-01-18 LAB — CBC
HCT: 38.3 % (ref 36.0–46.0)
Hemoglobin: 13.3 g/dL (ref 12.0–15.0)
MCHC: 34.7 g/dL (ref 30.0–36.0)
MCV: 94.3 fL (ref 78.0–100.0)
Platelets: 339 10*3/uL (ref 150–400)
RBC: 4.07 MIL/uL (ref 3.87–5.11)
RDW: 13.4 % (ref 11.5–15.5)
WBC: 7.9 10*3/uL (ref 4.0–10.5)

## 2011-01-18 LAB — URINALYSIS, ROUTINE W REFLEX MICROSCOPIC
Bilirubin Urine: NEGATIVE
Glucose, UA: NEGATIVE mg/dL
Ketones, ur: 15 mg/dL — AB
Leukocytes, UA: NEGATIVE
Nitrite: NEGATIVE
Protein, ur: NEGATIVE mg/dL
Specific Gravity, Urine: 1.021 (ref 1.005–1.030)
Urobilinogen, UA: 0.2 mg/dL (ref 0.0–1.0)
pH: 5.5 (ref 5.0–8.0)

## 2011-01-18 LAB — URINE MICROSCOPIC-ADD ON

## 2011-01-20 LAB — GLUCOSE, CAPILLARY: Glucose-Capillary: 104 mg/dL — ABNORMAL HIGH (ref 70–99)

## 2011-02-01 LAB — CBC
HCT: 38.9 % (ref 36.0–46.0)
Hemoglobin: 13.5 g/dL (ref 12.0–15.0)
MCHC: 34.8 g/dL (ref 30.0–36.0)
MCV: 92.5 fL (ref 78.0–100.0)
Platelets: 369 10*3/uL (ref 150–400)
RBC: 4.2 MIL/uL (ref 3.87–5.11)
RDW: 13.5 % (ref 11.5–15.5)
WBC: 8.6 10*3/uL (ref 4.0–10.5)

## 2011-02-01 LAB — BASIC METABOLIC PANEL
BUN: 6 mg/dL (ref 6–23)
CO2: 27 mEq/L (ref 19–32)
Calcium: 9 mg/dL (ref 8.4–10.5)
Chloride: 104 mEq/L (ref 96–112)
Creatinine, Ser: 0.88 mg/dL (ref 0.4–1.2)
GFR calc Af Amer: >60 mL/min (ref >60)
GFR calc non Af Amer: >60 mL/min (ref >60)
Glucose, Bld: 103 mg/dL — ABNORMAL HIGH (ref 70–99)
Potassium: 3.6 mEq/L (ref 3.5–5.1)
Sodium: 140 mEq/L (ref 135–145)

## 2011-02-01 LAB — DIFFERENTIAL
Basophils Absolute: 0 10*3/uL (ref 0.0–0.1)
Basophils Relative: 1 % (ref 0–1)
Eosinophils Absolute: 0.2 10*3/uL (ref 0.0–0.7)
Eosinophils Relative: 3 % (ref 0–5)
Lymphocytes Relative: 29 % (ref 12–46)
Lymphs Abs: 2.4 10*3/uL (ref 0.7–4.0)
Monocytes Absolute: 0.5 10*3/uL (ref 0.1–1.0)
Monocytes Relative: 6 % (ref 3–12)
Neutro Abs: 5.3 10*3/uL (ref 1.7–7.7)
Neutrophils Relative %: 62 % (ref 43–77)

## 2011-03-16 NOTE — Op Note (Signed)
NAMEHAILEA, [name]             ACCOUNT NO.:  [id number]   MEDICAL RECORD NO.:  [id number]          PATIENT TYPE:  AMB   LOCATION:  SDC                           FACILITY:  [facility]   PHYSICIAN:  Guy Sandifer. Henderson Cloud, M.D. DATE OF BIRTH:  02-09-1966   DATE OF PROCEDURE:  09/04/2008  DATE OF DISCHARGE:                               OPERATIVE REPORT   PREOPERATIVE DIAGNOSES:  1. Stress urinary incontinence.  2. Pelvic pain.   POSTOPERATIVE DIAGNOSES:  1. Stress urinary incontinence.  2. Pelvic pain.   PROCEDURE:  Transobturator mid urethral sling, cystoscopy, laparoscopy  with lysis of adhesions.   SURGEON:  Guy Sandifer. Henderson Cloud, MD   ANESTHESIA:  General with endotracheal intubation, Raul Del, MD   ESTIMATED BLOOD LOSS:  Drops.   INDICATIONS AND CONSENT:  This patient is a 45 year old married white  female G2, P2, status post hysterectomy and anterior repair in 1994 who  complains of increasingly severe leaking of urine.  She also has pelvic  pain that comes and goes.  Details are dictated in history and physical.  Transobturator mid urethral sling and laparoscopy have been discussed  preoperatively.  Potential risks and complications have been discussed  preoperatively including, but not limited to infection, organ damage,  bleeding with transfusion, HIV and hepatitis acquisition, DVT, PE,  pneumonia, recurrent pelvic pain, dyspareunia, delayed healing, erosion,  prolonged catheterization, self-catheterization, success and failure  rates of the sling, irritative voiding symptoms, and laparotomy.  All  questions have been answered and consent was signed on the chart.   FINDINGS:  Upper abdomen is grossly normal.  The pelvis, the ovaries,  and tubes are normal bilaterally.  There are multiple adhesions of the  epiploica of the sigmoid to the bladder dome, posterior cul-de-sac.   PROCEDURE:  The patient is taken to the operating room where she is  identified, placed in dorsal  supine position.  General anesthesia is  induced via endotracheal intubation.  She is then placed in the dorsal  lithotomy position where she is prepped abdominally and vaginally.  Bladder straight catheterized and she is draped in the sterile fashion.  The infraumbilical and suprapubic areas are injected with a total of 9  mL of 0.5% plain Marcaine.  Small infraumbilical incision is made and a  disposable Veress needle is placed on the first attempt without  difficulty.  A normal syringe and drop test are noted.  Two liters of  gas are insufflated under low pressure with good tympany in the right  upper quadrant.  Veress needle is removed and a 10-11 Xcel bladeless  disposable trocar sleeve is placed using direct visualization.  The  operative laparoscope is then placed.  A small suprapubic incision is  made in the midline and a 5-mm Xcel bladeless disposable trocar sleeve  is placed under direct visualization without difficulty.  The above  findings are noted.  Using the bipolar cautery, the point of insertion  into the pelvis on one band of adhesions is cauterized and then taken  down sharply with scissors.  The adhesions to the dome of the bladder at  the level of the vaginal cuff are taken down sharply and bluntly all  across the vaginal cuff.  Hemostasis is maintained with brief  superficial bipolar cautery.  There is a dense adhesion of the rectum in  the posterior cul-de-sac, which is not taken down.  Good hemostasis is  noted.  A 10 mL of 0.5% plain Marcaine is then instilled into the  peritoneal cavity.  Suprapubic trocar sleeve is removed and good  hemostasis is again noted.  The umbilical trocar sleeve is also removed.  A 0 Vicryl suture is used to reapproximate the subcutaneous tissues in  the umbilical incision under good visualization.  A single 2-0 Vicryl  suture is used to close the skin on the umbilical incision.  Dermabond  is used to also close the both incisions.   Attention is then turned to  the vagina.  The point of the obturator incision is marked bilaterally  with a marking pen.  These areas are then injected with 0.5% lidocaine  with 1:200,000 epinephrine.  Foley catheter is placed in the bladder.  The infraurethral portion of the vaginal mucosa is also injected with  the same solution.  Stab incision for the obturator pass is then made  bilaterally and a small linear incision is made below the urethra.  Dissection is carried out bilaterally to the urogenital diaphragm  through the suburethral incision.  Then using the halo needles, these  are passed through the obturator foramen and exited below the urethra  bilaterally with the passage of the needle tip guided by the examining  finger.  Needles are left in place and the Foley catheter is removed.  Cystoscopy is then carried out with 70-degree cystoscope.  A 360-degree  inspection reveals no evidence of foreign bodies, no evidence of  perforation.  A good puff of urine is noted from the ureters  bilaterally.  Cystoscope is removed and the Foley catheter is replaced.  Polypropylene mesh is then attached to the needle tips and withdrawn  through the obturator foramen bilaterally.  Sheath is then removed  intact.  There is 3-mm of space below the urethra to the sling.  It is  lying flat without any wrinkles or bunching.  Excess sling is removed at  the level of the skin bilaterally.  The vaginal incision is closed with  2-0 Vicryl in a running locking fashion.  Dermabond is applied to the  groin incisions.  All counts correct.  The patient is awakened, taken to  the recovery room in stable condition.      Guy Sandifer Henderson Cloud, M.D.  Electronically Signed     JET/MEDQ  D:  09/04/2008  T:  09/04/2008  Job:  [id number]

## 2011-03-16 NOTE — Discharge Summary (Signed)
NAMEJENNELLE, PINKSTAFF NO.:  [id number]   MEDICAL RECORD NO.:  [id number]          PATIENT TYPE:  IPS   LOCATION:  0301                          [facility]ILITY:  [facility]   PHYSICIAN:  Jasmine Pang, M.D. DATE OF BIRTH:  [birth year]   DATE OF ADMISSION:  08/21/2008  DATE OF DISCHARGE:  08/22/2008                               DISCHARGE SUMMARY   IDENTIFYING INFORMATION:  This is a 45 year old married white female  from [place] who was admitted on the voluntary basis on August 21, 2008.   HISTORY OF PRESENT ILLNESS:  The patient states she ran out of medicines  yesterday and did not want to go without.  She sees Ellis Savage at [facility].  She has been on Lamictal, Lexapro, Xanax,  Seroquel, amitriptyline, and Lyrica.  She denies suicidal ideation.  She  states she lied about this in order to get into the hospital.  She has  been diagnosed as bipolar disorder with schizophrenia.   PAST PSYCHIATRIC HISTORY:  The patient sees Ellis Savage at [facility].  She may start to seeing Dr. Betti Cruz.  She has no counseling.  She is to see Dr. Janann August and Dr. Lucretia Roers.   FAMILY HISTORY:  Mother has schizophrenic, bipolar..   ALCOHOL AND DRUG HISTORY:  One pack per day cigarettes.  No alcohol or  drugs.   MEDICAL PROBLEMS:  Fibromyalgia, migraines.   MEDICATIONS:  See hospital course.   DRUG ALLERGIES:  CORTISONE.   PHYSICAL FINDINGS:  There were no acute physical or medical problems  noted up on admission.   HOSPITAL COURSE:  The patient was restarted on her home medications of  Lyrica 175 mg p.o. t.i.d., amitriptyline 50 mg p.o. q.h.s., Rozerem 8 mg  p.o. q.h.s., Seroquel 300 mg p.o. q.h.s., and Xanax 1 mg. p.o. q.i.d.  p.r.n.  She was also started on ibuprofen 800 mg t.i.d. p.r.n. headache  pain.  As indicated in the history of present illness, the patient  stated she was not suicidal and she states she had lied about this in  order to get into the  hospital to get her medications.  She wanted to go  home.  Her mood was euthymic.  Affect was wide range.  There was no  suicidal or homicidal ideation.  No thoughts of self-injurious behavior.  No auditory or visual hallucinations.  No paranoia or delusions.  Thoughts were logical and goal-directed.  Thought content no predominant  theme.  Cognitive was grossly cognitive was grossly intact.  Insight  fair.  Judgment fair.  Impulse control good.  It was felt the patient  was safe for discharge today.   DISCHARGE DIAGNOSES:  Axis I: Bipolar disorder, not otherwise specified.  Axis II:  None.  Axis III:  Fibromyalgia, migraines.  Axis IV: Moderate (other psychosocial problems, medical problems, burden  of psychiatric illness).  Axis V: Global assessment of functioning was 60 upon discharge.  GAF was  50 upon admission.  GAF highest past year was 65.   DISCHARGE PLANS:  There was no specific activity or dietary  restrictions.  POST HOSPITAL CARE PLANS:  The patient will go to the [facility] and see Ellis Savage on November 11th at 11 o'clock a.m.  She  will go to the [facility] for follow up  where she had a case manager here on October 30th at 3 o'clock p.m.   DISCHARGE MEDICATIONS:  1. Lyrica 175 mg 3 times daily.  2. Elavil 50 mg at bedtime.  3. Rozerem 8 mg at bedtime.  4. Seroquel 300 mg at bedtime.  5. Xanax 1 mg 4 times daily as needed.  6. Lexapro and Lamictal as directed by her psychiatrist.      Jasmine Pang, M.D.  Electronically Signed     BHS/MEDQ  D:  09/15/2008  T:  09/16/2008  Job:  [id number]

## 2011-03-16 NOTE — H&P (Signed)
[name], [name]             ACCOUNT NO.:  [id number]   MEDICAL RECORD NO.:  [id number]          PATIENT TYPE:  AMB   LOCATION:                                [facility]  [facility]   PHYSICIAN:  Guy Sandifer. Henderson Cloud, M.D. DATE OF BIRTH:  July 21, 1966   DATE OF ADMISSION:  09/04/2008  DATE OF DISCHARGE:                              HISTORY & PHYSICAL   CHIEF COMPLAINT:  Pelvic pain and leaking urine.   HISTORY OF PRESENT ILLNESS:  This patient is a 45 year old married white  female G2, P2, status post hysterectomy and the anterior repair in 1994,  complains of increasing leaking of urine with coughing and sneezing.  Urodynamic evaluation is consistent with stress urinary incontinence.  She also complains of intermittent left lower quadrant pain with no real  pattern.  After discussion of options, she is being admitted for  laparoscopy and transobturator midurethral sling.  Potential risks and  complications have been discussed preoperatively.   PAST MEDICAL HISTORY:  1. Migraine headaches.  2. History of gallbladder disease.  3. Mood disorder.  4. Fibromyalgia.   PAST SURGICAL HISTORY:  1. LAVH in 1994.  2. Cholecystectomy.  3. Appendectomy.  4. Right rotator cuff surgery.   OBSTETRICAL HISTORY:  Vaginal livery x2.   MEDICATIONS:  1. Treximet p.r.n.  2. Rozerem at bedtime.  3. Phenergan p.r.n.  4. Xanax daily.  5. Lamictal daily.  6. Lexapro daily.  7. Seroquel.  8. Lyrica.  9. Soma.   ALLERGIES:  CORTISONE INJECTIONS leading to anaphylaxis.   FAMILY HISTORY:  Breast cancer maternal grandmother, lung cancer,  prostate cancer, emphysema.   SOCIAL HISTORY:  Denies drug abuse.  She does smoke cigarettes.   REVIEW OF SYSTEMS:  NEURO:  History of migraine as above.  PULMONARY:  Denies shortness of breath.  CARDIAC:  Denies chest pain.   PHYSICAL EXAMINATION:  VITAL SIGNS:  Height 5 feet 2 inches, weight  190.6 pounds, blood pressure 108/70.  HEENT:  Without thyromegaly.  LUNGS:  Clear to auscultation.  HEART:  Regular rate and rhythm.  BACK:  Without CVA tenderness.  BREASTS:  Without mass, retraction, or discharge.  ABDOMEN:  Soft and nontender without masses.  PELVIC:  Vulvovaginal without lesion.  Adnexa nontender without palpable  masses.  EXTREMITIES:  Grossly within normal limits.  NEUROLOGIC:  Grossly within normal limits.   ASSESSMENT:  1. Stress urinary continence.  2. Left lower quadrant pain.   PLAN:  Transobturator midurethral sling and laparoscopy.      Guy Sandifer Henderson Cloud, M.D.  Electronically Signed     JET/MEDQ  D:  09/03/2008  T:  09/03/2008  Job:  [id number]

## 2011-03-19 NOTE — Op Note (Signed)
[place]  Patient:    [name], [name] Visit Number: [id number] MRN: [id number]          Service Type: DSU Location: [phone] Attending Physician:  Sonda Primes Dictated by:   Mardene Celeste. Lurene Shadow, M.D. Proc. Date: 08/08/01 Admit Date:  08/08/2001   CC:         Geraldo Pitter, M.D.   Operative Report  PREOPERATIVE DIAGNOSIS:  Chronic calculus cholecystitis with acute biliary colic.  POSTOPERATIVE DIAGNOSIS:  Chronic calculus cholecystitis with acute biliary colic.  OPERATION PERFORMED:  Laparoscopic cholecystectomy with intraoperative cholangiogram.  SURGEON:  Luisa Hart L. Lurene Shadow, M.D.  ASSISTANT:  Magnus Ivan, RN, FA  ANESTHESIA:  General.  INDICATIONS FOR PROCEDURE:  The patient is a 45 year old woman with acute epigastric and right upper quadrant pain, nausea and vomiting with pain radiating to her subscapular region.  On ultrasound she was noted to have multiple small gallstones.  Liver function studies were within normal limits. She is brought to the operating room now for cholecystectomy with intraoperative cholangiogram.  DESCRIPTION OF PROCEDURE:  Following induction of satisfactory general anesthesia with the patient positioned supinely.  Abdomen prepped and draped routinely.  Open laparoscopy with Hasson cannula insertion at the umbilicus, insufflation of the peritoneal cavity to 14 mmHg with carbon dioxide.  Visual exploration with the camera, gallbladder noted to be chronically scarred with multiple adhesions of the duodenum up to the gallbladder, liver edges were sharp.  Liver surface was smooth.  Pelvic organs not visualized as she has had an abdominal hysterectomy.  None of the small and large intestine viewed appeared to be abnormal.  Under direct vision epigastric and lateral ports were placed.  Gallbladder grasped and retracted cephalad.  Dissection carried down near the ampulla with isolation of  cystic artery and cystic duct.  There was a rather prominent right hepatic artery that tracked up just underneath the gallbladder and then dipped down into the substance of the liver.  I double clipped the cystic artery, transected it, took the cystic duct proximally and opened with some debridement in distal cystic duct.  I used a 14 gauge angiocatheter through which I passed a Reddick catheter into the abdomen and then inserted into the cystic duct, then injected one half strength Hypaque dye into the biliary system and under fluoroscopic guidance, I got fluoroscopy which showed a normal biliary caliber, free flow of contrast to the duodenum, upper radicals appeared normal.  Catheter was removed and the cystic duct doubly clipped and transected.  The gallbladder dissected free from the liver bed maintaining hemostasis throughout the course of the dissection.  The liver bed then inspected and hemostasis assured.  Camera moved to the epigastric port and the gallbladder retrieved through the umbilical port without difficulty.  Right upper quadrant then thoroughly with normal saline and sucked dry.  The trocars removed under direct vision.  Pneumoperitoneum allowed to deflate and the wounds closed in layers as follows.  The umbilical wound in two layers with 0 Dexon and 4-0 Dexon.  Epigastric and lateral flank wounds closed with 4-0 Dexon sutures.  All wounds reinforced with Steri-Strips and sterile dressings applied.  Anesthetic reversed.  Patient removed from the operating room to the recovery room in stable condition having tolerated the procedure well. Dictated by:   Mardene Celeste. Lurene Shadow, M.D. Attending Physician:  Sonda Primes DD:  08/08/01 TD:  08/08/01 Job: [id number]

## 2011-03-19 NOTE — H&P (Signed)
[name], [name]:  [id number]   MEDICAL RECORD NO.:  [id number]          PATIENT TYPE:  IPS   LOCATION:  0502                          [facility]ILITY:  [facility]   PHYSICIAN:  Jeanice Lim, M.D. DATE OF BIRTH:  10-18-1966   DATE OF ADMISSION:  10/02/2004  DATE OF DISCHARGE:                         PSYCHIATRIC ADMISSION ASSESSMENT   HISTORY:  This is a voluntary admission for this 45 year old married white  female.  Apparently she presented to the emergency room and reported feeling  depressed since an injury to her neck on the job, June 17, 2004.  She  reports yesterday she felt suicidal with a plan to walk into traffic.  This  is due to excessively worrying about whether she will get [employer], paying the bills, etc., etc.  She reports decreased sleep,  decreased appetite, crying spells, panic attacks and auditory  hallucinations.  She says she is under financial and physical stress due to  her injury and being on medical leave.   PAST PSYCHIATRIC HISTORY:  She acknowledges one prior psychiatric admission  in 1998 to this center which [facility] then [facility].  She states that  she as having relationship conflict with her parents.   SOCIAL HISTORY:  She finished the 12th grade.  She is employed at [employer]  as a Administrator, Civil Service, although she is currently on medical leave.  She has  been married 20 years.  She has a son age 59 and a daughter 29.   FAMILY HISTORY:  Her mother was bipolar.  Alcohol and drug history.  She  smokes two packs of cigarettes a day for the past 25 years.  She denies  alcohol or any other drugs.  Her primary care [name] is Dr. Parke Simmers.  She  has migraines.  She has been prescribed Xanax 2 mg t.i.d. recently by Dr.  Parke Simmers.  She has used Xanax periodically on and off for the past 20 years.  She states it is no longer effective.  She is also taking Lorcet 10 mg  q.i.d. for her bulging disks in her neck.  She denies  any prior  antidepressants or any other prior psychiatric medications.  She is also  taking Flexeril 5 mg t.i.d.   ALLERGIES:  CORTISONE.  She stated she had a foot fracture.  They gave her  cortisone, and it made her extremely sick exhibiting nausea and vomiting.   PHYSICAL EXAMINATION:  Not repeated.  It was well documented in the ER.   MENTAL STATUS EXAM:  She is alert and oriented x3.  She is appropriately  groomed and dressed.  Her speech is not pressured.  Her mood is depressed.  Her affect is congruent.  Her thought processes are clear and rationale.  She is asking How long do I have to stay, etc.  Concentration and memory  are intact.  Judgment and insight are good.  Intelligence is average.  She  denies suicidal or homicidal ideations.  She is not acknowledging auditory  or visual hallucinations today, although she has had these in the past.   Axis I.  Depressive  disorder with psychotic features, auditory and visual  hallucinations versus bipolar disorder, currently depressed.  There is a  family history, and she did fill out a mood disorders questionnaire that was  markedly positive.   Axis II:  Deferred.   Axis III:  History for migraines, status post appendectomy, hysterectomy and  cholecystectomy.  Recent neck injury August of 2005.   Axis IV:  Severe.   Axis V:  30.   PLAN:  Admit for safety and stabilization to clarify her diagnosis and  institute appropriate medication.  Toward that end today, will just be  beginning Neurontin 300 mg q.3h. during the day, and 800 mg at h.s. as the  patient wants a stronger nerve medicine.  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     Mick   MD/MEDQ  D:  10/03/2004  T:  10/04/2004  Job:  [id number]

## 2011-07-19 ENCOUNTER — Telehealth (INDEPENDENT_AMBULATORY_CARE_PROVIDER_SITE_OTHER): Payer: Self-pay

## 2011-07-19 NOTE — Telephone Encounter (Signed)
Pt says she feels a new breast lump.  She called [facility] and arranged a Screening Mammogram 10-15 at 1pm.  They need an order sent over per the pt.

## 2011-08-02 LAB — CBC
HCT: 40.7
Hemoglobin: 13.8
MCHC: 33.8
MCV: 93.4
Platelets: 293
RBC: 4.35
RDW: 13.9
WBC: 7.8

## 2011-08-02 LAB — COMPREHENSIVE METABOLIC PANEL
ALT: 13
AST: 13
Albumin: 3.5
Alkaline Phosphatase: 23 — ABNORMAL LOW
BUN: 10
CO2: 25
Calcium: 9.3
Chloride: 106
Creatinine, Ser: 0.79
GFR calc Af Amer: >60
GFR calc non Af Amer: >60
Glucose, Bld: 101 — ABNORMAL HIGH
Potassium: 3.5
Sodium: 139
Total Bilirubin: 0.5
Total Protein: 6.1

## 2011-08-02 LAB — TSH: TSH: 2.252

## 2011-08-03 LAB — URINALYSIS, ROUTINE W REFLEX MICROSCOPIC
Bilirubin Urine: NEGATIVE
Glucose, UA: NEGATIVE
Ketones, ur: NEGATIVE
Leukocytes, UA: NEGATIVE
Nitrite: NEGATIVE
Protein, ur: NEGATIVE
Specific Gravity, Urine: >1.03 — ABNORMAL HIGH
Urobilinogen, UA: 0.2
pH: 5.5

## 2011-08-03 LAB — CBC
HCT: 41.5
Hemoglobin: 14.3
MCHC: 34.4
MCV: 92.9
Platelets: 319
RBC: 4.46
RDW: 13.6
WBC: 7.7

## 2011-08-03 LAB — URINE MICROSCOPIC-ADD ON

## 2011-08-26 ENCOUNTER — Encounter (INDEPENDENT_AMBULATORY_CARE_PROVIDER_SITE_OTHER): Payer: Self-pay | Admitting: General Surgery

## 2011-09-13 ENCOUNTER — Ambulatory Visit: Payer: Self-pay | Admitting: Oncology

## 2011-09-13 ENCOUNTER — Other Ambulatory Visit: Payer: Self-pay | Admitting: Lab

## 2012-01-25 ENCOUNTER — Encounter (INDEPENDENT_AMBULATORY_CARE_PROVIDER_SITE_OTHER): Payer: Self-pay | Admitting: Surgery

## 2012-01-25 ENCOUNTER — Ambulatory Visit (INDEPENDENT_AMBULATORY_CARE_PROVIDER_SITE_OTHER): Payer: Medicare Other | Admitting: Surgery

## 2012-01-25 VITALS — BP 118/74 | HR 70 | Temp 97.4°F | Resp 18 | Ht 66.0 in | Wt 200.6 lb

## 2012-01-25 DIAGNOSIS — L02219 Cutaneous abscess of trunk, unspecified: Secondary | ICD-10-CM

## 2012-01-25 DIAGNOSIS — L02215 Cutaneous abscess of perineum: Secondary | ICD-10-CM

## 2012-01-25 NOTE — Patient Instructions (Signed)
Anal Fistula An anal fistula is an abnormal tunnel that leads from the anal canal (which carries stool from the large intestine) to a hole in the skin near the anus (the opening through which stool passes out of your body).  CAUSES  Food you eat goes from your stomach into your intestine. As the food is digested, waste material (stool) forms. Stool passes through your large intestine, through the rectum and anal canal, and out of your body through the anus.  The anus has a number of tiny glands (clusters of specialized cells) that make lubricating fluid. Sometimes these glands can become infected. This type of infection may lead to the development of a pocket of pus (abscess). An anal fistula often develops after an infection or abscess; It is nearly always caused by a past anorectal abscess. You are at a higher risk of developing an anal fistula if you have:  Had an anal abscess.   Chronic inflammatory bowel disease, such as Crohn's disease or ulcerative colitis.   Conditions in which there are inflamed outpouchings of the intestinal wall (diverticulitis).   Colon or rectal cancer.   Sexually transmitted diseases involving the rectum, such as gonorrhea or chlamydia.   A history of anal radiation treatments, injury, or surgery.   An HIV infection.   A problem that has required treatment with steroid medicines for more than a short time.  SYMPTOMS   Anal pain, particularly around the area of a past abscess.   Drainage of pus, blood, stool or mucus from an opening in the skin.   Swelling around the skin opening.   Worn off skin around the opening.   A hot or red area near the anus.   Diarrhea.   Fever and chills.   Tiredness (fatigue).  DIAGNOSIS   In some cases, the opening of an anal fistula is easily seen during a physical exam.   A probe or scope may be used to help locate the opening of the fistula. In some cases, dye can be injected into the fistula opening, and X-rays  can be taken to find the exact location and path of the fistula.   A sample (biopsy) of the fistula tissue or anus may be taken to check for cancer.  TREATMENT   An anal fistula may need surgery to open it up and allow it to heal. This type of operation is called a fistulotomy.   A specialized kind of glue or plug to seal the fistula may be used.   An antibiotic may be prescribed to treat an existing infection.  HOME CARE INSTRUCTIONS   Take medications (such as antibiotics) as prescribed by your caregiver.   Only take over-the-counter or prescription medicine for pain, discomfort, or fever as directed by your caregiver.   Follow your prescribed diet. You may need a higher fiber diet to help avoid constipation.   Drink lots of water as directed.   Use a stool softener or laxative, if recommended.   A warm sitz bath several times a day may be soothing, as well as help with healing.   Follow excellent hygiene to keep the anal area as clean as possible. Consider using pre-moistened towelettes to keep the anal area clean after using the bathroom.  SEEK MEDICAL CARE IF:  You have increased pain not controlled with medications.   You notice new swelling, redness, or hotness in the anal area.   You develop any problems passing urine.   You develop a fever (more   than 100.5 F (38.1 C).  SEEK IMMEDIATE MEDICAL CARE IF:  You have severe, intolerable pain.   You have severe problems passing urine or cannot pass any urine at all.   You develop an unexplained oral temperature above 102.0 F (38.9 C).   You notice new or worsening leakage of blood, pus, mucus, or stool.  Document Released: 09/30/2008 Document Revised: 10/07/2011 Document Reviewed: 09/30/2008 Exit[employer] Patient Information 2012 [employer]. 

## 2012-01-25 NOTE — Progress Notes (Signed)
Patient ID: [name], female   DOB: 04-Dec-1965, 46 y.o.   MRN: [id number]  Chief Complaint  Patient presents with  . Abscess    Rectal    HPI [name] is a 46 y.o. female.  The patient presents at the request of Dr Thea Silversmith due to recurrent perineal abscess.  Has had multiple abscess involving left anal canal and left buttock for 1 and a half years.  Improves with antibiotics and drains on its own.  It is smaller now.  Recurrent problem.  No fever or chills. HPI  Past Medical History  Diagnosis Date  . Abscess     rectal  . COPD (chronic obstructive pulmonary disease)   . Emphysema of lung     Past Surgical History  Procedure Date  . Breast surgery 2008    left breast lumpectomy  . Appendectomy 1993  . Abdominal hysterectomy 1993    Family History  Problem Relation Age of Onset  . Cancer Father     lung    Social History History  Substance Use Topics  . Smoking status: Current Everyday Smoker -- 0.5 packs/day    Types: Cigarettes  . Smokeless tobacco: Never Used  . Alcohol Use: No    Allergies  Allergen Reactions  . Cortisone Acetate Shortness Of Breath and Other (See Comments)    Makes patient feel as if she is choking.    Current Outpatient Prescriptions  Medication Sig Dispense Refill  . amoxicillin (AMOXIL) 875 MG tablet Take 875 mg by mouth 2 (two) times daily.        Review of Systems Review of Systems  Constitutional: Negative for fever, chills and unexpected weight change.  HENT: Negative for hearing loss, congestion, sore throat, trouble swallowing and voice change.   Eyes: Negative for visual disturbance.  Respiratory: Negative for cough and wheezing.   Cardiovascular: Negative for chest pain, palpitations and leg swelling.  Gastrointestinal: Negative for nausea, vomiting, abdominal pain, diarrhea, constipation, blood in stool, abdominal distention and anal bleeding.  Genitourinary: Negative for hematuria, vaginal bleeding and  difficulty urinating.  Musculoskeletal: Negative for arthralgias.  Skin: Negative for rash and wound.  Neurological: Negative for seizures, syncope and headaches.  Hematological: Negative for adenopathy. Does not bruise/bleed easily.  Psychiatric/Behavioral: Negative for confusion.    Blood pressure 118/74, pulse 70, temperature 97.4 F (36.3 C), temperature source Temporal, resp. rate 18, height 5\' 6"  (1.676 m), weight 200 lb 9.6 oz (90.992 kg).  Physical Exam Physical Exam  Constitutional: She is oriented to person, place, and time. She appears well-developed and well-nourished.  HENT:  Head: Normocephalic and atraumatic.  Eyes: EOM are normal. Pupils are equal, round, and reactive to light.  Neck: Normal range of motion. Neck supple.  Cardiovascular: Normal rate and regular rhythm.   Pulmonary/Chest: Effort normal and breath sounds normal.  Genitourinary:     Neurological: She is alert and oriented to person, place, and time. GCS eye subscore is 4. GCS verbal subscore is 5. GCS motor subscore is 6.  Skin: Skin is warm and dry.  Psychiatric: She has a normal mood and affect. Her speech is normal and behavior is normal. Thought content normal.    Data Reviewed   Assessment    Recurrent perineum abscess question fistula en ano    Plan    Exam under anesthesia.The procedure has been discussed with the patient.  Alternative therapies have been discussed with the patient.  Operative risks include bleeding,  Infection,  Organ  injury,  Nerve injury,  Blood vessel injury,  DVT,  Pulmonary embolism,  Death,  And possible reoperation fecal incontinence.  Medical management risks include worsening of present situation.  The success of the procedure is 50 -90 % at treating patients symptoms.  The patient understands and agrees to proceed.       Jailynn Lavalais A. 01/25/2012, 3:28 PM

## 2012-01-31 HISTORY — PX: CYST EXCISION PERINEAL: SHX6278

## 2012-02-10 ENCOUNTER — Encounter (HOSPITAL_BASED_OUTPATIENT_CLINIC_OR_DEPARTMENT_OTHER): Payer: Self-pay | Admitting: *Deleted

## 2012-02-10 ENCOUNTER — Ambulatory Visit (HOSPITAL_COMMUNITY): Admission: RE | Admit: 2012-02-10 | Payer: Medicare Other | Source: Ambulatory Visit

## 2012-02-10 NOTE — Progress Notes (Signed)
Dx COPD-used proair prn-smokes-no htn To come in for ccs labs and cxr

## 2012-02-14 ENCOUNTER — Ambulatory Visit
Admission: RE | Admit: 2012-02-14 | Discharge: 2012-02-14 | Disposition: A | Discharge status: Home / Self Care | Payer: Medicare Other | Source: Ambulatory Visit | Attending: Surgery | Admitting: Surgery

## 2012-02-14 ENCOUNTER — Encounter (HOSPITAL_BASED_OUTPATIENT_CLINIC_OR_DEPARTMENT_OTHER)
Admission: RE | Admit: 2012-02-14 | Discharge: 2012-02-14 | Disposition: A | Discharge status: Home / Self Care | Payer: Medicare Other | Source: Ambulatory Visit | Attending: Surgery | Admitting: Surgery

## 2012-02-14 LAB — DIFFERENTIAL
Basophils Absolute: 0 10*3/uL (ref 0.0–0.1)
Basophils Relative: 1 % (ref 0–1)
Eosinophils Absolute: 0.3 10*3/uL (ref 0.0–0.7)
Eosinophils Relative: 4 % (ref 0–5)
Lymphocytes Relative: 32 % (ref 12–46)
Lymphs Abs: 2.3 10*3/uL (ref 0.7–4.0)
Monocytes Absolute: 0.4 10*3/uL (ref 0.1–1.0)
Monocytes Relative: 6 % (ref 3–12)
Neutro Abs: 4.1 10*3/uL (ref 1.7–7.7)
Neutrophils Relative %: 58 % (ref 43–77)

## 2012-02-14 LAB — COMPREHENSIVE METABOLIC PANEL
ALT: 18 U/L (ref 0–35)
AST: 16 U/L (ref 0–37)
Albumin: 3.7 g/dL (ref 3.5–5.2)
Alkaline Phosphatase: 31 U/L — ABNORMAL LOW (ref 39–117)
BUN: 5 mg/dL — ABNORMAL LOW (ref 6–23)
CO2: 26 mEq/L (ref 19–32)
Calcium: 9.4 mg/dL (ref 8.4–10.5)
Chloride: 104 mEq/L (ref 96–112)
Creatinine, Ser: 0.71 mg/dL (ref 0.50–1.10)
GFR calc Af Amer: >90 mL/min (ref >90)
GFR calc non Af Amer: >90 mL/min (ref >90)
Glucose, Bld: 111 mg/dL — ABNORMAL HIGH (ref 70–99)
Potassium: 3.8 mEq/L (ref 3.5–5.1)
Sodium: 142 mEq/L (ref 135–145)
Total Bilirubin: 0.1 mg/dL — ABNORMAL LOW (ref 0.3–1.2)
Total Protein: 6.5 g/dL (ref 6.0–8.3)

## 2012-02-14 LAB — CBC
HCT: 37.9 % (ref 36.0–46.0)
Hemoglobin: 13.1 g/dL (ref 12.0–15.0)
MCH: 31.5 pg (ref 26.0–34.0)
MCHC: 34.6 g/dL (ref 30.0–36.0)
MCV: 91.1 fL (ref 78.0–100.0)
Platelets: 372 10*3/uL (ref 150–400)
RBC: 4.16 MIL/uL (ref 3.87–5.11)
RDW: 13.4 % (ref 11.5–15.5)
WBC: 7.1 10*3/uL (ref 4.0–10.5)

## 2012-02-15 ENCOUNTER — Encounter (HOSPITAL_BASED_OUTPATIENT_CLINIC_OR_DEPARTMENT_OTHER): Payer: Self-pay | Admitting: Anesthesiology

## 2012-02-15 ENCOUNTER — Ambulatory Visit (HOSPITAL_BASED_OUTPATIENT_CLINIC_OR_DEPARTMENT_OTHER)
Admission: RE | Admit: 2012-02-15 | Discharge: 2012-02-15 | Disposition: A | Discharge status: Home / Self Care | Payer: Medicare Other | Source: Ambulatory Visit | Attending: Surgery | Admitting: Surgery

## 2012-02-15 ENCOUNTER — Encounter (HOSPITAL_BASED_OUTPATIENT_CLINIC_OR_DEPARTMENT_OTHER): Payer: Self-pay

## 2012-02-15 ENCOUNTER — Encounter (HOSPITAL_BASED_OUTPATIENT_CLINIC_OR_DEPARTMENT_OTHER)
Admission: RE | Disposition: A | Discharge status: Home / Self Care | Payer: Self-pay | Source: Ambulatory Visit | Attending: Surgery

## 2012-02-15 ENCOUNTER — Ambulatory Visit (HOSPITAL_BASED_OUTPATIENT_CLINIC_OR_DEPARTMENT_OTHER): Payer: Medicare Other | Admitting: Anesthesiology

## 2012-02-15 DIAGNOSIS — N816 Rectocele: Secondary | ICD-10-CM | POA: Insufficient documentation

## 2012-02-15 DIAGNOSIS — N3000 Acute cystitis without hematuria: Secondary | ICD-10-CM

## 2012-02-15 DIAGNOSIS — L723 Sebaceous cyst: Secondary | ICD-10-CM | POA: Insufficient documentation

## 2012-02-15 DIAGNOSIS — N3946 Mixed incontinence: Secondary | ICD-10-CM

## 2012-02-15 DIAGNOSIS — G4700 Insomnia, unspecified: Secondary | ICD-10-CM

## 2012-02-15 DIAGNOSIS — F319 Bipolar disorder, unspecified: Secondary | ICD-10-CM

## 2012-02-15 DIAGNOSIS — Z01812 Encounter for preprocedural laboratory examination: Secondary | ICD-10-CM | POA: Insufficient documentation

## 2012-02-15 DIAGNOSIS — J438 Other emphysema: Secondary | ICD-10-CM | POA: Insufficient documentation

## 2012-02-15 DIAGNOSIS — F17200 Nicotine dependence, unspecified, uncomplicated: Secondary | ICD-10-CM | POA: Insufficient documentation

## 2012-02-15 DIAGNOSIS — IMO0001 Reserved for inherently not codable concepts without codable children: Secondary | ICD-10-CM

## 2012-02-15 DIAGNOSIS — B9789 Other viral agents as the cause of diseases classified elsewhere: Secondary | ICD-10-CM

## 2012-02-15 HISTORY — DX: Depression, unspecified: F32.A

## 2012-02-15 HISTORY — PX: EXAMINATION UNDER ANESTHESIA: SHX1540

## 2012-02-15 HISTORY — DX: Major depressive disorder, single episode, unspecified: F32.9

## 2012-02-15 SURGERY — EXAM UNDER ANESTHESIA
Anesthesia: General | Site: Perineum | Laterality: Left | Wound class: Contaminated

## 2012-02-15 MED ORDER — ONDANSETRON HCL 4 MG/2ML IJ SOLN
INTRAMUSCULAR | Status: DC | PRN
  Administered 2012-02-15: 4 mg via INTRAVENOUS

## 2012-02-15 MED ORDER — BUPIVACAINE-EPINEPHRINE 0.25% -1:200000 IJ SOLN
INTRAMUSCULAR | Status: DC | PRN
  Administered 2012-02-15: 10 mL

## 2012-02-15 MED ORDER — HYDROMORPHONE HCL PF 1 MG/ML IJ SOLN
0.2500 mg | INTRAMUSCULAR | Status: DC | PRN
  Administered 2012-02-15 (×2): 0.5 mg via INTRAVENOUS

## 2012-02-15 MED ORDER — PROPOFOL 10 MG/ML IV EMUL
INTRAVENOUS | Status: DC | PRN
  Administered 2012-02-15: 200 mg via INTRAVENOUS

## 2012-02-15 MED ORDER — LORAZEPAM 2 MG/ML IJ SOLN: 1.0000 mg | Freq: Once | INTRAMUSCULAR | Status: DC | PRN

## 2012-02-15 MED ORDER — CEFAZOLIN SODIUM-DEXTROSE 2-3 GM-% IV SOLR
2.0000 g | INTRAVENOUS | Status: AC
  Administered 2012-02-15: 2 g via INTRAVENOUS

## 2012-02-15 MED ORDER — FENTANYL CITRATE 0.05 MG/ML IJ SOLN
INTRAMUSCULAR | Status: DC | PRN
  Administered 2012-02-15: 100 ug via INTRAVENOUS

## 2012-02-15 MED ORDER — FENTANYL CITRATE 0.05 MG/ML IJ SOLN: 50.0000 ug | INTRAMUSCULAR | Status: DC | PRN

## 2012-02-15 MED ORDER — MIDAZOLAM HCL 2 MG/2ML IJ SOLN: 1.0000 mg | INTRAMUSCULAR | Status: DC | PRN

## 2012-02-15 MED ORDER — OXYCODONE-ACETAMINOPHEN 5-325 MG PO TABS: 1.0000 | ORAL_TABLET | ORAL | Status: AC | PRN

## 2012-02-15 MED ORDER — CHLORHEXIDINE GLUCONATE 4 % EX LIQD: 1.0000 "application " | Freq: Once | CUTANEOUS | Status: DC

## 2012-02-15 MED ORDER — LACTATED RINGERS IV SOLN
INTRAVENOUS | Status: DC
  Administered 2012-02-15: 11:00:00 via INTRAVENOUS

## 2012-02-15 MED ORDER — LIDOCAINE HCL (CARDIAC) 20 MG/ML IV SOLN
INTRAVENOUS | Status: DC | PRN
  Administered 2012-02-15: 100 mg via INTRAVENOUS

## 2012-02-15 MED ORDER — MIDAZOLAM HCL 5 MG/5ML IJ SOLN
INTRAMUSCULAR | Status: DC | PRN
  Administered 2012-02-15: 2 mg via INTRAVENOUS

## 2012-02-15 SURGICAL SUPPLY — 40 items
BLADE SURG 15 STRL LF DISP TIS (BLADE) ×1 IMPLANT
BLADE SURG 15 STRL SS (BLADE) ×2
BRIEF STRETCH FOR OB PAD LRG (UNDERPADS AND DIAPERS) IMPLANT
CANISTER SUCTION 1200CC (MISCELLANEOUS) ×2 IMPLANT
CLOTH BEACON ORANGE TIMEOUT ST (SAFETY) ×2 IMPLANT
COVER MAYO STAND STRL (DRAPES) IMPLANT
DECANTER SPIKE VIAL GLASS SM (MISCELLANEOUS) ×1 IMPLANT
DRAIN PENROSE 1/4X12 LTX STRL (WOUND CARE) ×1 IMPLANT
DRAPE UTILITY XL STRL (DRAPES) ×2 IMPLANT
DRSG PAD ABDOMINAL 8X10 ST (GAUZE/BANDAGES/DRESSINGS) ×2 IMPLANT
ELECT REM PT RETURN 9FT ADLT (ELECTROSURGICAL) ×2
ELECTRODE REM PT RTRN 9FT ADLT (ELECTROSURGICAL) IMPLANT
GAUZE SPONGE 4X4 12PLY STRL LF (GAUZE/BANDAGES/DRESSINGS) ×2 IMPLANT
GLOVE BIO SURGEON STRL SZ 6.5 (GLOVE) ×1 IMPLANT
GLOVE BIOGEL PI IND STRL 6.5 (GLOVE) IMPLANT
GLOVE BIOGEL PI IND STRL 8 (GLOVE) ×1 IMPLANT
GLOVE BIOGEL PI INDICATOR 6.5 (GLOVE) ×1
GLOVE BIOGEL PI INDICATOR 8 (GLOVE) ×1
GLOVE ECLIPSE 6.5 STRL STRAW (GLOVE) ×1 IMPLANT
GLOVE ECLIPSE 8.0 STRL XLNG CF (GLOVE) ×2 IMPLANT
GOWN PREVENTION PLUS XLARGE (GOWN DISPOSABLE) ×4 IMPLANT
NDL HYPO 25X1 1.5 SAFETY (NEEDLE) ×1 IMPLANT
NEEDLE HYPO 25X1 1.5 SAFETY (NEEDLE) ×2 IMPLANT
PACK BASIN DAY SURGERY FS (CUSTOM PROCEDURE TRAY) ×2 IMPLANT
PACK LITHOTOMY IV (CUSTOM PROCEDURE TRAY) ×2 IMPLANT
PENCIL BUTTON HOLSTER BLD 10FT (ELECTRODE) ×1 IMPLANT
SHEET MEDIUM DRAPE 40X70 STRL (DRAPES) IMPLANT
SPONGE SURGIFOAM ABS GEL 12-7 (HEMOSTASIS) IMPLANT
SURGILUBE 2OZ TUBE FLIPTOP (MISCELLANEOUS) ×2 IMPLANT
SUT CHROMIC 3 0 SH 27 (SUTURE) IMPLANT
SUT ETHILON 3 0 PS 1 (SUTURE) ×1 IMPLANT
SYR CONTROL 10ML LL (SYRINGE) ×2 IMPLANT
TOWEL OR 17X24 6PK STRL BLUE (TOWEL DISPOSABLE) ×3 IMPLANT
TOWEL OR NON WOVEN STRL DISP B (DISPOSABLE) ×2 IMPLANT
TRAY DSU PREP LF (CUSTOM PROCEDURE TRAY) ×2 IMPLANT
TRAY PROCTOSCOPIC FIBER OPTIC (SET/KITS/TRAYS/PACK) IMPLANT
TUBE CONNECTING 20X1/4 (TUBING) ×2 IMPLANT
UNDERPAD 30X30 INCONTINENT (UNDERPADS AND DIAPERS) ×2 IMPLANT
WATER STERILE IRR 1000ML POUR (IV SOLUTION) IMPLANT
YANKAUER SUCT BULB TIP NO VENT (SUCTIONS) ×2 IMPLANT

## 2012-02-15 NOTE — H&P (View-Only) (Signed)
Patient ID: [name], female   DOB: [birth year], 46 y.o.   MRN: [id number]  Chief Complaint  Patient presents with  . Abscess    Rectal    HPI [name] is a 46 y.o. female.  The patient presents at the request of Dr Thea Silversmith due to recurrent perineal abscess.  Has had multiple abscess involving left anal canal and left buttock for 1 and a half years.  Improves with antibiotics and drains on its own.  It is smaller now.  Recurrent problem.  No fever or chills. HPI  Past Medical History  Diagnosis Date  . Abscess     rectal  . COPD (chronic obstructive pulmonary disease)   . Emphysema of lung     Past Surgical History  Procedure Date  . Breast surgery 2008    left breast lumpectomy  . Appendectomy 1993  . Abdominal hysterectomy 1993    Family History  Problem Relation Age of Onset  . Cancer Father     lung    Social History History  Substance Use Topics  . Smoking status: Current Everyday Smoker -- 0.5 packs/day    Types: Cigarettes  . Smokeless tobacco: Never Used  . Alcohol Use: No    Allergies  Allergen Reactions  . Cortisone Acetate Shortness Of Breath and Other (See Comments)    Makes patient feel as if she is choking.    Current Outpatient Prescriptions  Medication Sig Dispense Refill  . amoxicillin (AMOXIL) 875 MG tablet Take 875 mg by mouth 2 (two) times daily.        Review of Systems Review of Systems  Constitutional: Negative for fever, chills and unexpected weight change.  HENT: Negative for hearing loss, congestion, sore throat, trouble swallowing and voice change.   Eyes: Negative for visual disturbance.  Respiratory: Negative for cough and wheezing.   Cardiovascular: Negative for chest pain, palpitations and leg swelling.  Gastrointestinal: Negative for nausea, vomiting, abdominal pain, diarrhea, constipation, blood in stool, abdominal distention and anal bleeding.  Genitourinary: Negative for hematuria, vaginal bleeding and  difficulty urinating.  Musculoskeletal: Negative for arthralgias.  Skin: Negative for rash and wound.  Neurological: Negative for seizures, syncope and headaches.  Hematological: Negative for adenopathy. Does not bruise/bleed easily.  Psychiatric/Behavioral: Negative for confusion.    Blood pressure 118/74, pulse 70, temperature 97.4 F (36.3 C), temperature source Temporal, resp. rate 18, height 5\' 6"  (1.676 m), weight 200 lb 9.6 oz (90.992 kg).  Physical Exam Physical Exam  Constitutional: She is oriented to person, place, and time. She appears well-developed and well-nourished.  HENT:  Head: Normocephalic and atraumatic.  Eyes: EOM are normal. Pupils are equal, round, and reactive to light.  Neck: Normal range of motion. Neck supple.  Cardiovascular: Normal rate and regular rhythm.   Pulmonary/Chest: Effort normal and breath sounds normal.  Genitourinary:     Neurological: She is alert and oriented to person, place, and time. GCS eye subscore is 4. GCS verbal subscore is 5. GCS motor subscore is 6.  Skin: Skin is warm and dry.  Psychiatric: She has a normal mood and affect. Her speech is normal and behavior is normal. Thought content normal.    Data Reviewed   Assessment    Recurrent perineum abscess question fistula en ano    Plan    Exam under anesthesia.The procedure has been discussed with the patient.  Alternative therapies have been discussed with the patient.  Operative risks include bleeding,  Infection,  Organ  injury,  Nerve injury,  Blood vessel injury,  DVT,  Pulmonary embolism,  Death,  And possible reoperation fecal incontinence.  Medical management risks include worsening of present situation.  The success of the procedure is 50 -90 % at treating patients symptoms.  The patient understands and agrees to proceed.       [name] A. 01/25/2012, 3:28 PM

## 2012-02-15 NOTE — Interval H&P Note (Signed)
History and Physical Interval Note:  02/15/2012 10:17 AM  [facility]  has presented today for surgery, with the diagnosis of perineum abscess  The various methods of treatment have been discussed with the patient and family. After consideration of risks, benefits and other options for treatment, the patient has consented to  Procedure(s) (LRB): EXAM UNDER ANESTHESIA (N/A) as a surgical intervention .  The patients' history has been reviewed, patient examined, no change in status, stable for surgery.  I have reviewed the patients' chart and labs.  Questions were answered to the patient's satisfaction.     Deakin Lacek A.

## 2012-02-15 NOTE — Anesthesia Preprocedure Evaluation (Signed)
Anesthesia Evaluation  Patient identified by MRN, date of birth, ID band Patient awake    Airway Mallampati: I TM Distance: >3 FB Neck ROM: Full    Dental   Pulmonary asthma , COPDCurrent Smoker,  + rhonchi         Cardiovascular     Neuro/Psych Bipolar Disorder  Neuromuscular disease    GI/Hepatic   Endo/Other    Renal/GU      Musculoskeletal  (+) Fibromyalgia -  Abdominal (+) + obese,   Peds  Hematology   Anesthesia Other Findings   Reproductive/Obstetrics                           Anesthesia Physical Anesthesia Plan  ASA: III  Anesthesia Plan: General   Post-op Pain Management:    Induction: Intravenous  Airway Management Planned: LMA  Additional Equipment:   Intra-op Plan:   Post-operative Plan: Extubation in OR  Informed Consent: I have reviewed the patients History and Physical, chart, labs and discussed the procedure including the risks, benefits and alternatives for the proposed anesthesia with the patient or authorized representative who has indicated his/her understanding and acceptance.     Plan Discussed with: CRNA and Surgeon  Anesthesia Plan Comments:         Anesthesia Quick Evaluation

## 2012-02-15 NOTE — Op Note (Signed)
Preop diagnosis: Recurrent perineal abscess  Postop diagnosis: Chronically inflamed left perineal epidermal inclusion cyst  Procedure: Excision of left perineal cyst  Surgeon: Harriette Bouillon M.D.  Anesthesia: Gen. with 0.25% Sensorcaine local with epinephrine  EBL: 10 cc  Specimen: Skin cyst and subcutaneous tissue from perineum  IV fluids: 300 cc crystalloid  Indications for procedure: The patient presents due to recurrent abscess cc involving her left perineum. These have been chronic have been recurring over the last 2 years. She has been treated with antibiotics by her primary care doctor but the problem has not resolved completely. Upon examination the office, that area between one and 3:00 involving her left perineum was noted. This is approximately 5 cm from the anal verge. I discussed options of exam under anesthesia to evaluate for fistula in ano versus chronic undrained abscess. She was to proceed with examiner anesthesia. Risks, benefits and alternative therapies were discussed and are outlined in history and physical.  Description of procedure: The patient was met in the holding area and questions were answered. She was taken back to the operating room and placed supine. General anesthesia was initiated. The patient was placed in lithotomy stirrups exposing the perineum. The perineum was prepped and draped in a sterile fashion. Timeout was done. She received 2 g of Ancef. At 3:00 in the left perineum was a small opening. A small probe was placed through this and the tract 2 cm into the subcutaneous fat and stop. I excised this area down to healthy fat this appeared to be a chronic cyst. The cyst  was passed off the field. Digital rectal examination was done and this is normal. Anoscopy was performed and this was normal. She has mild external hemorrhoid disease involving the right posterior column and left lateral columns. She has small rectocele upon inspection of the vagina. A  quarter-inch Penrose drain was placed in the wound and 3-0 nylon was used to secure it to the skin. Dry dressings were applied. All final counts sponge, needle and instruments found to be correct. The patient was then taken out of lithotomy extubated and taken to recovery in satisfactory condition.

## 2012-02-15 NOTE — Anesthesia Procedure Notes (Signed)
Procedure Name: LMA Insertion Performed by: Alasha Mcguinness W Pre-anesthesia Checklist: Patient identified, Timeout performed, Emergency Drugs available, Suction available and Patient being monitored Patient Re-evaluated:Patient Re-evaluated prior to inductionOxygen Delivery Method: Circle system utilized Preoxygenation: Pre-oxygenation with 100% oxygen Intubation Type: IV induction Ventilation: Mask ventilation without difficulty LMA: LMA with gastric port inserted LMA Size: 4.0 Number of attempts: 1 Placement Confirmation: breath sounds checked- equal and bilateral and positive ETCO2 Tube secured with: Tape Dental Injury: Teeth and Oropharynx as per pre-operative assessment      

## 2012-02-15 NOTE — Transfer of Care (Signed)
Immediate Anesthesia Transfer of Care Note  Patient: [name]  Procedure(s) Performed: Procedure(s) (LRB): EXAM UNDER ANESTHESIA (Left)  Patient Location: PACU  Anesthesia Type: General  Level of Consciousness: awake, alert  and oriented  Airway & Oxygen Therapy: Patient Spontanous Breathing and Patient connected to face mask oxygen  Post-op Assessment: Report given to PACU RN and Post -op Vital signs reviewed and stable  Post vital signs: Reviewed and stable  Complications: No apparent anesthesia complications

## 2012-02-15 NOTE — Discharge Instructions (Signed)
[facility] _______[facility] ° °RECTAL SURGERY POST OP INSTRUCTIONS: POST OP INSTRUCTIONS ° °Always review your discharge instruction sheet given to you by the facility where your surgery was performed. °IF YOU HAVE DISABILITY OR FAMILY LEAVE FORMS, YOU MUST BRING THEM TO THE OFFICE FOR PROCESSING.   °DO NOT GIVE THEM TO YOUR DOCTOR. ° °1. A  prescription for pain medication may be given to you upon discharge.  Take your pain medication as prescribed, if needed.  If narcotic pain medicine is not needed, then you may take acetaminophen (Tylenol) or ibuprofen (Advil) as needed. °2. Take your usually prescribed medications unless otherwise directed. °3. If you need a refill on your pain medication, please contact your pharmacy.  They will contact our office to request authorization. Prescriptions will not be filled after 5 pm or on week-ends. °4. You should follow a light diet the first 48 hours after arrival home, such as soup and crackers, etc.  Be sure to include lots of fluids daily.  Resume your normal diet 2-3 days after surgery.. °5. Most patients will experience some swelling and discomfort in the rectal area. Ice packs, reclining and warm tub soaks will help.  Swelling and discomfort can take several days to resolve.  °6. It is common to experience some constipation if taking pain medication after surgery.  Increasing fluid intake and taking a stool softener (such as Colace) will usually help or prevent this problem from occurring.  A mild laxative (Milk of Magnesia or Miralax) should be taken according to package directions if there are no bowel movements after 48 hours. °7. Unless discharge instructions indicate otherwise, leave your bandage dry and in place for 24 hours, or remove the bandage if you have a bowel movement. You may notice a small amount of bleeding with bowel movements for the first few days. You may have some packing in the rectum which will come out over the first day or two. You  will need to wear an absorbent pad or soft cotton gauze in your underwear until the drainage stops.it. °8. ACTIVITIES:  You may resume regular (light) daily activities beginning the next day--such as daily self-care, walking, climbing stairs--gradually increasing activities as tolerated.  You may have sexual intercourse when it is comfortable.  Refrain from any heavy lifting or straining until approved by your doctor. °a. You may drive when you are no longer taking prescription pain medication, you can comfortably wear a seatbelt, and you can safely maneuver your car and apply brakes. °b. RETURN TO WORK: : ____________________ °c.  °9. You should see your doctor in the office for a follow-up appointment approximately 2-3 weeks after your surgery.  Make sure that you call for this appointment within a day or two after you arrive home to insure a convenient appointment time. °10. OTHER INSTRUCTIONS:  __________________________________________________________________________________________________________________________________________________________________________________________  °WHEN TO CALL YOUR DOCTOR: °1. Fever over 101.0 °2. Inability to urinate °3. Nausea and/or vomiting °4. Extreme swelling or bruising °5. Continued bleeding from rectum. °6. Increased pain, redness, or drainage from the incision °7. Constipation ° °The clinic staff is available to answer your questions during regular business hours.  Please don’t hesitate to call and ask to speak to one of the nurses for clinical concerns.  If you have a medical emergency, go to the nearest emergency room or call 911.  A surgeon from [facility] is always on call at the hospital ° ° °[address], [address], [place], [place]  [address] ? °   [address] [address], [place], [place]   [address] °[phone] ? [phone] ? FAX [phone] °Web site: [email or web address] ° ° °Post Anesthesia Home Care Instructions ° °Activity: °Get  plenty of rest for the remainder of the day. A responsible adult should stay with you for 24 hours following the procedure.  °For the next 24 hours, DO NOT: °-Drive a car °-Operate machinery °-Drink alcoholic beverages °-Take any medication unless instructed by your physician °-Make any legal decisions or sign important papers. ° °Meals: °Start with liquid foods such as gelatin or soup. Progress to regular foods as tolerated. Avoid greasy, spicy, heavy foods. If nausea and/or vomiting occur, drink only clear liquids until the nausea and/or vomiting subsides. Call your physician if vomiting continues. ° °Special Instructions/Symptoms: °Your throat may feel dry or sore from the anesthesia or the breathing tube placed in your throat during surgery. If this causes discomfort, gargle with warm salt water. The discomfort should disappear within 24 hours. ° °

## 2012-02-15 NOTE — Anesthesia Postprocedure Evaluation (Signed)
  Anesthesia Post-op Note  Patient: [name]  Procedure(s) Performed: Procedure(s) (LRB): EXAM UNDER ANESTHESIA (Left)  Patient Location: PACU  Anesthesia Type: General  Level of Consciousness: awake  Airway and Oxygen Therapy: Patient Spontanous Breathing  Post-op Pain: mild  Post-op Assessment: Post-op Vital signs reviewed, Patient's Cardiovascular Status Stable, Respiratory Function Stable, Patent Airway, No signs of Nausea or vomiting, Adequate PO intake and Pain level controlled  Post-op Vital Signs: stable  Complications: No apparent anesthesia complications

## 2012-02-16 ENCOUNTER — Encounter (HOSPITAL_BASED_OUTPATIENT_CLINIC_OR_DEPARTMENT_OTHER): Payer: Self-pay | Admitting: Surgery

## 2012-02-21 ENCOUNTER — Encounter (HOSPITAL_BASED_OUTPATIENT_CLINIC_OR_DEPARTMENT_OTHER): Payer: Self-pay

## 2012-02-22 ENCOUNTER — Encounter (INDEPENDENT_AMBULATORY_CARE_PROVIDER_SITE_OTHER): Payer: Self-pay | Admitting: Surgery

## 2012-02-22 ENCOUNTER — Ambulatory Visit (INDEPENDENT_AMBULATORY_CARE_PROVIDER_SITE_OTHER): Payer: Medicare Other | Admitting: Surgery

## 2012-02-22 VITALS — BP 132/88 | HR 92 | Temp 98.0°F | Resp 24 | Ht 66.0 in | Wt 209.8 lb

## 2012-02-22 DIAGNOSIS — Z9889 Other specified postprocedural states: Secondary | ICD-10-CM

## 2012-02-22 NOTE — Patient Instructions (Signed)
Return 1 month.  Expect some drainage.

## 2012-02-22 NOTE — Progress Notes (Signed)
Patient returns after excision of small sinus tract from left buttock. Penrose drain was removed. The wound is clean without signs of infection. She will return in one month. Overall, she is doing well. Final pathology showed inflammation in subcutaneous hemorrhage.

## 2012-03-31 ENCOUNTER — Ambulatory Visit (INDEPENDENT_AMBULATORY_CARE_PROVIDER_SITE_OTHER): Payer: Medicare Other | Admitting: Surgery

## 2012-03-31 ENCOUNTER — Encounter (INDEPENDENT_AMBULATORY_CARE_PROVIDER_SITE_OTHER): Payer: Self-pay | Admitting: Surgery

## 2012-03-31 VITALS — BP 130/83 | HR 107 | Temp 98.0°F | Ht 64.0 in | Wt 198.6 lb

## 2012-03-31 DIAGNOSIS — Z9889 Other specified postprocedural states: Secondary | ICD-10-CM

## 2012-03-31 NOTE — Progress Notes (Signed)
Patient returns after excision of cyst from perineum. She's doing well.  Exam: Well healed wound left perineum.  Impression: Status post excision of cyst left perineum  Plan: Return as needed. She does have one small area overlying her pubis she states is similar to the area and perineum. A total watch this but on exam today I see nothing there to treat

## 2012-03-31 NOTE — Patient Instructions (Signed)
Return as needed

## 2012-04-29 ENCOUNTER — Inpatient Hospital Stay (HOSPITAL_COMMUNITY)
Admission: AD | Admit: 2012-04-29 | Discharge: 2012-04-29 | Disposition: A | Discharge status: Home / Self Care | Payer: Medicare Other | Source: Ambulatory Visit | Attending: Obstetrics & Gynecology | Admitting: Obstetrics & Gynecology

## 2012-04-29 ENCOUNTER — Encounter (HOSPITAL_COMMUNITY): Payer: Self-pay | Admitting: *Deleted

## 2012-04-29 DIAGNOSIS — T192XXA Foreign body in vulva and vagina, initial encounter: Secondary | ICD-10-CM

## 2012-04-29 HISTORY — DX: Fibromyalgia: M79.7

## 2012-04-29 HISTORY — DX: Unspecified ovarian cyst, unspecified side: N83.209

## 2012-04-29 HISTORY — DX: Endometriosis, unspecified: N80.9

## 2012-04-29 NOTE — MAU Provider Note (Signed)
[name] 46 y.o. Comes to MAU with small finger attachment (Sex toy) which is "stuck" in vaginal.  Unable to remove at home - both she and her partner tried.  Had some spotting.  No other complaints.  In no distress. Exam: Speculum inserted in vagina.   Able to see purple object about 2 inches in length.  Used Forcep to hold object and able to remove easily through open speculum.  No active bleeding.  Minimal amount of pink blood seen on object.  Client tolerated very well and then left ambulatory.

## 2012-04-29 NOTE — MAU Note (Signed)
Finger massager lodged in vagina, got stuck, attempted to remove it without success, seemed to be getting more wedged.

## 2012-07-12 ENCOUNTER — Inpatient Hospital Stay (HOSPITAL_COMMUNITY)
Admission: AD | Admit: 2012-07-12 | Discharge: 2012-07-12 | Disposition: A | Discharge status: Home / Self Care | Payer: Medicare Other | Source: Ambulatory Visit | Attending: Obstetrics & Gynecology | Admitting: Obstetrics & Gynecology

## 2012-07-12 ENCOUNTER — Inpatient Hospital Stay (HOSPITAL_COMMUNITY): Payer: Medicare Other

## 2012-07-12 ENCOUNTER — Encounter (HOSPITAL_COMMUNITY): Payer: Self-pay

## 2012-07-12 DIAGNOSIS — N949 Unspecified condition associated with female genital organs and menstrual cycle: Secondary | ICD-10-CM | POA: Insufficient documentation

## 2012-07-12 DIAGNOSIS — N390 Urinary tract infection, site not specified: Secondary | ICD-10-CM

## 2012-07-12 DIAGNOSIS — R141 Gas pain: Secondary | ICD-10-CM

## 2012-07-12 DIAGNOSIS — K5903 Drug induced constipation: Secondary | ICD-10-CM

## 2012-07-12 DIAGNOSIS — R109 Unspecified abdominal pain: Secondary | ICD-10-CM | POA: Insufficient documentation

## 2012-07-12 DIAGNOSIS — Z90710 Acquired absence of both cervix and uterus: Secondary | ICD-10-CM | POA: Insufficient documentation

## 2012-07-12 LAB — URINE MICROSCOPIC-ADD ON

## 2012-07-12 LAB — URINALYSIS, ROUTINE W REFLEX MICROSCOPIC
Bilirubin Urine: NEGATIVE
Glucose, UA: NEGATIVE mg/dL
Ketones, ur: NEGATIVE mg/dL
Leukocytes, UA: NEGATIVE
Nitrite: NEGATIVE
Protein, ur: NEGATIVE mg/dL
Specific Gravity, Urine: <1.005 — ABNORMAL LOW (ref 1.005–1.030)
Urobilinogen, UA: 0.2 mg/dL (ref 0.0–1.0)
pH: 6 (ref 5.0–8.0)

## 2012-07-12 LAB — CBC
HCT: 44 % (ref 36.0–46.0)
Hemoglobin: 15.2 g/dL — ABNORMAL HIGH (ref 12.0–15.0)
MCH: 30.2 pg (ref 26.0–34.0)
MCHC: 34.5 g/dL (ref 30.0–36.0)
MCV: 87.5 fL (ref 78.0–100.0)
Platelets: 322 10*3/uL (ref 150–400)
RBC: 5.03 MIL/uL (ref 3.87–5.11)
RDW: 14.4 % (ref 11.5–15.5)
WBC: 13.4 10*3/uL — ABNORMAL HIGH (ref 4.0–10.5)

## 2012-07-12 MED ORDER — HYDROMORPHONE HCL PF 1 MG/ML IJ SOLN: 1.0000 mg | Freq: Once | INTRAMUSCULAR | Status: DC

## 2012-07-12 MED ORDER — HYDROMORPHONE HCL PF 1 MG/ML IJ SOLN
1.0000 mg | Freq: Once | INTRAMUSCULAR | Status: AC
  Administered 2012-07-12: 1 mg via INTRAMUSCULAR
  Filled 2012-07-12: qty 1

## 2012-07-12 MED ORDER — KETOROLAC TROMETHAMINE 60 MG/2ML IM SOLN
60.0000 mg | Freq: Once | INTRAMUSCULAR | Status: AC
  Administered 2012-07-12: 60 mg via INTRAMUSCULAR
  Filled 2012-07-12: qty 2

## 2012-07-12 MED ORDER — PHENAZOPYRIDINE HCL 200 MG PO TABS: 200.0000 mg | ORAL_TABLET | Freq: Three times a day (TID) | ORAL | Status: AC

## 2012-07-12 MED ORDER — CIPROFLOXACIN HCL 500 MG PO TABS: 500.0000 mg | ORAL_TABLET | Freq: Two times a day (BID) | ORAL | Status: AC

## 2012-07-12 NOTE — MAU Note (Signed)
Pt states lower abd pain on left side x3 months, has progressed over past few weeks. Now pain is more towards vaginal area and shoots up left side. Hx ovarian cyst on left side, had adhesions removed on left side also. Denies abnormal vaginal discharge or blood.

## 2012-07-12 NOTE — MAU Note (Signed)
Patient states she has been having left lower to mid abdominal pain for about 3 months. Worse for the past 2 weeks. Has had a partial hysterectomy in 1994. Has continued to have problems with the ovaries. Extreme nausea, no vomiting.

## 2012-07-12 NOTE — MAU Provider Note (Signed)
Chief Complaint: Abdominal Pain   First Provider Initiated Contact with Patient 07/12/12 1614     SUBJECTIVE HPI: [name] is a 46 y.o. G2P2002 at who presents to MAU reporting 3 month Hx of LLQ pain similar to ovarian cyst pain that she has experienced the past, that has significantly worsened. Today her pain is 8/10 on pain scale and now radiating up her left side and some nausea and mild frequency of urination. She denies fever, chills, vagina bleeding, dysuria, urgency, vaginal discharge, diarrhea. Denied constipation initially, but then reported difficult bowel movements approximately every other day since taking Percocet for chronic neck and back pain. States she is a patient of Dr. Vear Clock at pain management clinic. She is S/P hysterectomy 1994 for endometriosis and "precancer" of the cervix. Lysis of adhesions and left lower quadrant performed by Dr. Henderson Cloud 2 years ago.   Past Medical History  Diagnosis Date  . Abscess     rectal  . COPD (chronic obstructive pulmonary disease)   . Emphysema of lung   . Depression   . Headache   . Fibromyalgia   . Ovarian cyst   . Endometriosis    OB History    Grav Para Term Preterm Abortions TAB SAB Ect Mult Living   2 2 2  0 0 0 0 0 0 2     # Outc Date GA Lbr Len/2nd Wgt Sex Del Anes PTL Lv   1 TRM            2 TRM              Past Surgical History  Procedure Date  . Appendectomy 1993  . Abdominal hysterectomy 1993  . Cholecystectomy   . Breast surgery 2008    left breast lumpectomy  . Examination under anesthesia 02/15/2012    Procedure: EXAM UNDER ANESTHESIA;  Surgeon: Maisie Fus A. Cornett, MD;  Location: [facility];  Service: General;  Laterality: Left;  Excision of perineal cyst   History   Social History  . Marital Status: Married    Spouse Name: N/A    Number of Children: N/A  . Years of Education: N/A   Occupational History  . Not on file.   Social History Main Topics  . Smoking status: Current  Every Day Smoker -- 1.0 packs/day for 30 years    Types: Cigarettes  . Smokeless tobacco: Never Used  . Alcohol Use: No  . Drug Use: No  . Sexually Active: Yes    Birth Control/ Protection: Surgical   Other Topics Concern  . Not on file   Social History Narrative  . No narrative on file   No current facility-administered medications on file prior to encounter.   Current Outpatient Prescriptions on File Prior to Encounter  Medication Sig Dispense Refill  . albuterol (PROVENTIL HFA;VENTOLIN HFA) 108 (90 BASE) MCG/ACT inhaler Inhale 2 puffs into the lungs every 6 (six) hours as needed.      . ALPRAZolam (XANAX) 1 MG tablet Take 1 mg by mouth 3 (three) times daily as needed.      . citalopram (CELEXA) 20 MG tablet Take 20 mg by mouth daily. depression      . doxepin (SINEQUAN) 150 MG capsule Take 150 mg by mouth at bedtime.      Marland Kitchen estazolam (PROSOM) 2 MG tablet Take 2 mg by mouth at bedtime. For sleep           Percocet Allergies  Allergen Reactions  .  Cortisone Acetate Shortness Of Breath and Other (See Comments)    Makes patient feel as if she is choking.    ROS: Pertinent items in HPI  OBJECTIVE Blood pressure 137/83, pulse 98, temperature 98 F (36.7 C), temperature source Oral, resp. rate 18, height 5\' 6"  (1.676 m), weight 89.086 kg (196 lb 6.4 oz), SpO2 98.00%. GENERAL: Well-developed, well-nourished female in moderate distress.  HEENT: Normocephalic HEART: normal rate RESP: normal effort ABDOMEN: Distended, moderate left lower quadrant tenderness. No CVA tenderness. Multiple well-healed laparoscopic incisions. Positive bowel sounds x4. EXTREMITIES: Nontender, no edema NEURO: Alert and oriented SPECULUM EXAM: Deferred  LAB RESULTS Recent Results (from the past 336 hour(s))  URINALYSIS, ROUTINE W REFLEX MICROSCOPIC   Collection Time   07/12/12  3:00 PM      Component Value Range   Color, Urine YELLOW  YELLOW   APPearance CLEAR  CLEAR   Specific Gravity, Urine  <1.005 (*) 1.005 - 1.030   pH 6.0  5.0 - 8.0   Glucose, UA NEGATIVE  NEGATIVE mg/dL   Hgb urine dipstick MODERATE (*) NEGATIVE   Bilirubin Urine NEGATIVE  NEGATIVE   Ketones, ur NEGATIVE  NEGATIVE mg/dL   Protein, ur NEGATIVE  NEGATIVE mg/dL   Urobilinogen, UA 0.2  0.0 - 1.0 mg/dL   Nitrite NEGATIVE  NEGATIVE   Leukocytes, UA NEGATIVE  NEGATIVE  URINE MICROSCOPIC-ADD ON   Collection Time   07/12/12  3:00 PM      Component Value Range   Squamous Epithelial / LPF RARE  RARE   WBC, UA 0-2  <3 WBC/hpf   RBC / HPF 3-6  <3 RBC/hpf   Bacteria, UA FEW (*) RARE  CBC   Collection Time   07/12/12  4:50 PM      Component Value Range   WBC 13.4 (*) 4.0 - 10.5 K/uL   RBC 5.03  3.87 - 5.11 MIL/uL   Hemoglobin 15.2 (*) 12.0 - 15.0 g/dL   HCT 53.6  64.4 - 03.4 %   MCV 87.5  78.0 - 100.0 fL   MCH 30.2  26.0 - 34.0 pg   MCHC 34.5  30.0 - 36.0 g/dL   RDW 74.2  59.5 - 63.8 %   Platelets 322  150 - 400 K/uL    IMAGING US Transvaginal Non-ob  07/12/2012  *RADIOLOGY REPORT*  Clinical Data: Left lower quadrant pain.  TRANSABDOMINAL AND TRANSVAGINAL ULTRASOUND OF PELVIS  Technique:  Both transabdominal and transvaginal ultrasound examinations of the pelvis were performed including evaluation of the uterus, ovaries, adnexal regions, and pelvic cul-de-sac.  Comparison: CT 04/11/2010  Findings:  Uterus: Surgically absent.  Endometrium: Surgically absent.  Right Ovary: Not visualized transabdominally or transvaginally.  No adnexal masses.  Left Ovary: 3.8 x 3.3 x 4.5 cm.  Detail of the left ovary difficult to visualize due to the location and adjacent bowel gas.  Other Findings:  No free fluid.  IMPRESSION: Prior hysterectomy.  Right ovary not visualized.  Left ovary difficult to visualize or evaluate.   Original Report Authenticated By: Cyndie Chime, M.D.    US Pelvis Complete  07/12/2012  *RADIOLOGY REPORT*  Clinical Data: Left lower quadrant pain.  TRANSABDOMINAL AND TRANSVAGINAL ULTRASOUND OF PELVIS   Technique:  Both transabdominal and transvaginal ultrasound examinations of the pelvis were performed including evaluation of the uterus, ovaries, adnexal regions, and pelvic cul-de-sac.  Comparison: CT 04/11/2010  Findings:  Uterus: Surgically absent.  Endometrium: Surgically absent.  Right Ovary: Not visualized transabdominally or transvaginally.  No adnexal masses.  Left Ovary: 3.8 x 3.3 x 4.5 cm.  Detail of the left ovary difficult to visualize due to the location and adjacent bowel gas.  Other Findings:  No free fluid.  IMPRESSION: Prior hysterectomy.  Right ovary not visualized.  Left ovary difficult to visualize or evaluate.   Original Report Authenticated By: Cyndie Chime, M.D.    US Renal  07/12/2012  *RADIOLOGY REPORT*  Clinical Data:  Left flank pain.  Hematuria.  RENAL/URINARY TRACT ULTRASOUND COMPLETE  Comparison:  None.  Findings:  Right Kidney:  Normal in size and parenchymal echogenicity.  No evidence of mass or hydronephrosis.  Left Kidney:  Normal in size and parenchymal echogenicity.  No evidence of mass or hydronephrosis.  Bladder:  Empty at time of exam.  IMPRESSION: Negative.  No evidence of hydronephrosis or other significant findings.   Original Report Authenticated By: Danae Orleans, M.D.    ED COURSE No pain relief w/ Toradol. Renal ultrasound per consult with Dr. Tamela Oddi. Discussed normal renal ultrasound with Dr. Tamela Oddi. Ordered pelvic ultrasound. Pain relieved by Dilaudid 1 mg.   ASSESSMENT 1. UTI (lower urinary tract infection)   2. Gas pain   3. Constipation due to pain medication    PLAN Discharge home Urine culture pending. Will not prescribe narcotics since patient has Percocet prescription from pain management clinic. Expect relief of pain upon resolution of constipation, gas and UTI. MiraLAX or milk of magnesia daily until soft daily bowel movement. Increase water and fiber. Follow-up Information    Follow up with HARPER,CHARLES A, MD. Schedule  an appointment as soon as possible for a visit in 1 week.   Contact information:   [address] [place] [address] [phone]       Follow up with [facility]. (As needed if symptoms worsen)    Contact information:   [address] [place] [address]           Medication List     As of 07/20/2012  1:03 AM    STOP taking these medications         amoxicillin 875 MG tablet   Commonly known as: AMOXIL      paliperidone 6 MG 24 hr tablet   Commonly known as: INVEGA      TAKE these medications         albuterol 108 (90 BASE) MCG/ACT inhaler   Commonly known as: PROVENTIL HFA;VENTOLIN HFA   Inhale 2 puffs into the lungs every 6 (six) hours as needed.      ALPRAZolam 1 MG tablet   Commonly known as: XANAX   Take 1 mg by mouth 3 (three) times daily as needed.      ALPRAZolam 1 MG 24 hr tablet   Commonly known as: XANAX XR   Take 1 mg by mouth every morning.      ciprofloxacin 500 MG tablet   Commonly known as: CIPRO   Take 1 tablet (500 mg total) by mouth 2 (two) times daily.      citalopram 20 MG tablet   Commonly known as: CELEXA   Take 20 mg by mouth daily. depression      doxepin 150 MG capsule   Commonly known as: SINEQUAN   Take 150 mg by mouth at bedtime.      estazolam 2 MG tablet   Commonly known as: PROSOM   Take 2 mg by mouth at bedtime. For sleep  oxyCODONE-acetaminophen 5-325 MG per tablet   Commonly known as: PERCOCET/ROXICET   Take 1 tablet by mouth every 4 (four) hours as needed. Neck pain         [place], [place] 07/12/2012  4:39 PM

## 2012-07-14 LAB — URINE CULTURE
Colony Count: 30000
Special Requests: NORMAL

## 2012-08-21 ENCOUNTER — Other Ambulatory Visit: Payer: Self-pay | Admitting: Radiology

## 2012-10-18 ENCOUNTER — Encounter (INDEPENDENT_AMBULATORY_CARE_PROVIDER_SITE_OTHER): Payer: Self-pay

## 2012-11-11 ENCOUNTER — Emergency Department (HOSPITAL_COMMUNITY)
Admission: EM | Admit: 2012-11-11 | Discharge: 2012-11-11 | Discharge status: Absent without leave | Payer: Medicaid Other | Source: Home / Self Care | Attending: Emergency Medicine | Admitting: Emergency Medicine

## 2013-06-21 ENCOUNTER — Observation Stay (HOSPITAL_COMMUNITY)
Admission: EM | Admit: 2013-06-21 | Discharge: 2013-06-21 | Disposition: A | Discharge status: Home / Self Care | Payer: Medicare Other | Attending: Emergency Medicine | Admitting: Emergency Medicine

## 2013-06-21 ENCOUNTER — Emergency Department (HOSPITAL_COMMUNITY): Payer: Medicare Other

## 2013-06-21 ENCOUNTER — Encounter (HOSPITAL_COMMUNITY): Payer: Self-pay | Admitting: *Deleted

## 2013-06-21 DIAGNOSIS — R079 Chest pain, unspecified: Principal | ICD-10-CM | POA: Insufficient documentation

## 2013-06-21 DIAGNOSIS — R110 Nausea: Secondary | ICD-10-CM | POA: Insufficient documentation

## 2013-06-21 DIAGNOSIS — J4489 Other specified chronic obstructive pulmonary disease: Secondary | ICD-10-CM | POA: Insufficient documentation

## 2013-06-21 DIAGNOSIS — J449 Chronic obstructive pulmonary disease, unspecified: Secondary | ICD-10-CM | POA: Insufficient documentation

## 2013-06-21 DIAGNOSIS — R0602 Shortness of breath: Secondary | ICD-10-CM | POA: Insufficient documentation

## 2013-06-21 LAB — BASIC METABOLIC PANEL
BUN: 11 mg/dL (ref 6–23)
CO2: 25 mEq/L (ref 19–32)
Calcium: 9.3 mg/dL (ref 8.4–10.5)
Chloride: 99 mEq/L (ref 96–112)
Creatinine, Ser: 0.74 mg/dL (ref 0.50–1.10)
GFR calc Af Amer: >90 mL/min (ref >90)
GFR calc non Af Amer: >90 mL/min (ref >90)
Glucose, Bld: 94 mg/dL (ref 70–99)
Potassium: 4 mEq/L (ref 3.5–5.1)
Sodium: 135 mEq/L (ref 135–145)

## 2013-06-21 LAB — CBC
HCT: 40.1 % (ref 36.0–46.0)
Hemoglobin: 14.3 g/dL (ref 12.0–15.0)
MCH: 31.1 pg (ref 26.0–34.0)
MCHC: 35.7 g/dL (ref 30.0–36.0)
MCV: 87.2 fL (ref 78.0–100.0)
Platelets: 268 10*3/uL (ref 150–400)
RBC: 4.6 MIL/uL (ref 3.87–5.11)
RDW: 13.9 % (ref 11.5–15.5)
WBC: 6.9 10*3/uL (ref 4.0–10.5)

## 2013-06-21 LAB — POCT I-STAT TROPONIN I
Troponin i, poc: 0 ng/mL (ref 0.00–0.08)
Troponin i, poc: 0.03 ng/mL (ref 0.00–0.08)

## 2013-06-21 LAB — PRO B NATRIURETIC PEPTIDE: Pro B Natriuretic peptide (BNP): 89.5 pg/mL (ref 0–125)

## 2013-06-21 MED ORDER — ASPIRIN 325 MG PO TABS
325.0000 mg | ORAL_TABLET | Freq: Once | ORAL | Status: AC
  Administered 2013-06-21: 325 mg via ORAL
  Filled 2013-06-21: qty 1

## 2013-06-21 MED ORDER — ALBUTEROL SULFATE (5 MG/ML) 0.5% IN NEBU
2.5000 mg | INHALATION_SOLUTION | RESPIRATORY_TRACT | Status: DC
  Administered 2013-06-21: 2.5 mg via RESPIRATORY_TRACT
  Filled 2013-06-21: qty 0.5

## 2013-06-21 MED ORDER — ONDANSETRON 4 MG PO TBDP
4.0000 mg | ORAL_TABLET | Freq: Once | ORAL | Status: AC
  Administered 2013-06-21: 4 mg via ORAL
  Filled 2013-06-21: qty 1

## 2013-06-21 MED ORDER — IPRATROPIUM BROMIDE 0.02 % IN SOLN
0.5000 mg | RESPIRATORY_TRACT | Status: DC
  Administered 2013-06-21: 0.5 mg via RESPIRATORY_TRACT
  Filled 2013-06-21: qty 2.5

## 2013-06-21 NOTE — ED Provider Notes (Signed)
CSN: [id number]     Arrival date & time 06/21/13  1359 History     First MD Initiated Contact with Patient 06/21/13 1503     Chief Complaint  Patient presents with  . Chest Pain  . Shortness of Breath   (Consider location/radiation/quality/duration/timing/severity/associated sxs/prior Treatment) Patient is a 47 y.o. female presenting with chest pain. The history is provided by the patient.  Chest Pain Pain location:  L chest Pain quality: sharp   Pain quality comment:  Squeezing Pain radiates to:  L arm Pain radiates to the back: no   Pain severity:  Moderate Onset quality:  Sudden Timing:  Intermittent Progression:  Worsening Chronicity:  Recurrent (Had one day of intermittent CP 3 weeks ago, did not seek medical attention at that time) Context: not breathing, not eating, not raising an arm, not at rest and no trauma   Relieved by:  Nothing Worsened by:  Nothing tried Ineffective treatments:  None tried Associated symptoms: shortness of breath   Associated symptoms: no fever     Past Medical History  Diagnosis Date  . Abscess     rectal  . COPD (chronic obstructive pulmonary disease)   . Emphysema of lung   . Depression   . Headache(784.0)   . Fibromyalgia   . Ovarian cyst   . Endometriosis    Past Surgical History  Procedure Laterality Date  . Appendectomy  1993  . Abdominal hysterectomy  1993  . Cholecystectomy    . Breast surgery  2008    left breast lumpectomy  . Examination under anesthesia  02/15/2012    Procedure: EXAM UNDER ANESTHESIA;  Surgeon: Maisie Fus A. Cornett, MD;  Location: [facility];  Service: General;  Laterality: Left;  Excision of perineal cyst   Family History  Problem Relation Age of Onset  . Cancer Father     lung  . Other Neg Hx    History  Substance Use Topics  . Smoking status: Current Every Day Smoker -- 1.00 packs/day for 30 years    Types: Cigarettes  . Smokeless tobacco: Never Used  . Alcohol Use: No   OB  History   Grav Para Term Preterm Abortions TAB SAB Ect Mult Living   2 2 2  0 0 0 0 0 0 2     Review of Systems  Constitutional: Negative for fever.  Respiratory: Positive for shortness of breath.   Cardiovascular: Positive for chest pain. Negative for leg swelling.  All other systems reviewed and are negative.    Allergies  Cortisone acetate  Home Medications   Current Outpatient Rx  Name  Route  Sig  Dispense  Refill  . albuterol (PROVENTIL HFA;VENTOLIN HFA) 108 (90 BASE) MCG/ACT inhaler   Inhalation   Inhale 2 puffs into the lungs every 6 (six) hours as needed.         . ALPRAZolam (XANAX XR) 1 MG 24 hr tablet   Oral   Take 1 mg by mouth every morning.         Marland Kitchen ALPRAZolam (XANAX) 1 MG tablet   Oral   Take 1 mg by mouth 3 (three) times daily as needed.         . carbamazepine (EQUETRO) 200 MG CP12 12 hr capsule   Oral   Take 400 mg by mouth at bedtime.         Marland Kitchen lisdexamfetamine (VYVANSE) 50 MG capsule   Oral   Take 50 mg by mouth daily.         Marland Kitchen  oxyCODONE-acetaminophen (PERCOCET/ROXICET) 5-325 MG per tablet   Oral   Take 1 tablet by mouth every 4 (four) hours as needed. Neck pain         . tiotropium (SPIRIVA) 18 MCG inhalation capsule   Inhalation   Place 18 mcg into inhaler and inhale daily.         . traZODone (DESYREL) 150 MG tablet   Oral   Take 150 mg by mouth at bedtime.          BP 112/73  Pulse 46  Temp(Src) 98.1 F (36.7 C) (Oral)  Resp 19  SpO2 95% Physical Exam  Nursing note and vitals reviewed. Constitutional: She is oriented to person, place, and time. She appears well-developed and well-nourished. No distress.  HENT:  Head: Normocephalic and atraumatic.  Eyes: EOM are normal. Pupils are equal, round, and reactive to light.  Neck: Normal range of motion. Neck supple.  Cardiovascular: Normal rate and regular rhythm.  Exam reveals no friction rub.   No murmur heard. Pulmonary/Chest: Effort normal. No respiratory  distress. She has wheezes (occasional). She has no rales.  Abdominal: Soft. She exhibits no distension. There is no tenderness. There is no rebound.  Musculoskeletal: Normal range of motion. She exhibits no edema.  Neurological: She is alert and oriented to person, place, and time.  Skin: She is not diaphoretic.    ED Course   Procedures (including critical care time)  Labs Reviewed  CBC  BASIC METABOLIC PANEL  PRO B NATRIURETIC PEPTIDE  POCT I-STAT TROPONIN I  POCT I-STAT TROPONIN I   Dg Chest 2 View  06/21/2013   CLINICAL DATA:  Increased shortness of breath. COPD.  EXAM: CHEST  2 VIEW  COMPARISON:  02/14/2012  FINDINGS: Mild hyperinflation, stable. Heart and mediastinal contours are within normal limits. No focal opacities or effusions. No acute bony abnormality.  IMPRESSION: Stable hyperinflation/ COPD. No acute findings or change.   Electronically Signed   By: Charlett Nose   On: 06/21/2013 15:02   1. Chest pain       Date: 06/21/2013  Rate: 62  Rhythm: normal sinus rhythm  QRS Axis: normal  Intervals: normal  ST/T Wave abnormalities: nothing  Conduction Disutrbances:none  Narrative Interpretation:   Old EKG Reviewed: none available   MDM   42F presents with CP. Central, radiating down L arm. Multiple episodes. Associated SOB, nausea. No vomiting. Hx of COPD, no prior CP. No known CAD. AFVSS here. Exam with some mild wheezes, no respiratory distress. Abdomen benign. Patient with sinus bradycardia on the monitor with periods of HRs in the 30s-40s very briefly. Concern for ACS. Initial troponin normal. Will give breathing treatment.  Patient feeling better after breathing treatment. Initial troponin normal. Will admit for bradycardic episodes.  Patient does not want to be admitted. I instructed to f/u with PCP for stress test, patient agrees. 2nd troponin normal - 0.03.       Dagmar Hait, MD 06/21/13 2312

## 2013-06-21 NOTE — Progress Notes (Signed)
Called to admit patient per EDP/Dr. Gwendolyn Grant for chest pain and bradycardia.upon interviewing patient she states she really does not want to stay in the hospital. I went over the risks of leaving without completing chest pain/bradycardia evaluation-including death especially if the cause of the chest pain is cardiac related. She voiced understanding and states her husband will be with her and she'll come back to the ED she needs to and also that she followup with her PCP on Monday. Patient states she will sign the AMA papers if that is what she needs to do, Dr. Gwendolyn Grant performed patient's decision.  Donnalee Curry [facility] [phone]

## 2013-06-21 NOTE — ED Notes (Signed)
Pt reports mid chest pain that radiates around left side x 1 hour. Having sob and nausea. ekg done at triage. No acute distress noted at triage.

## 2013-08-01 HISTORY — PX: LEFT OOPHORECTOMY: SHX1961

## 2013-08-14 ENCOUNTER — Other Ambulatory Visit: Payer: Self-pay | Admitting: Pain Medicine

## 2013-08-14 ENCOUNTER — Ambulatory Visit
Admission: RE | Admit: 2013-08-14 | Discharge: 2013-08-14 | Disposition: A | Discharge status: Home / Self Care | Payer: Medicare Other | Source: Ambulatory Visit | Attending: Pain Medicine | Admitting: Pain Medicine

## 2013-08-14 DIAGNOSIS — M545 Low back pain, unspecified: Secondary | ICD-10-CM

## 2013-09-22 ENCOUNTER — Encounter (HOSPITAL_COMMUNITY): Payer: Self-pay | Admitting: Emergency Medicine

## 2013-09-22 ENCOUNTER — Emergency Department (HOSPITAL_COMMUNITY)
Admission: EM | Admit: 2013-09-22 | Discharge: 2013-09-22 | Disposition: A | Discharge status: Home / Self Care | Payer: Medicaid Other | Attending: Emergency Medicine | Admitting: Emergency Medicine

## 2013-09-22 ENCOUNTER — Emergency Department (HOSPITAL_COMMUNITY): Payer: Medicaid Other

## 2013-09-22 DIAGNOSIS — Y9389 Activity, other specified: Secondary | ICD-10-CM | POA: Insufficient documentation

## 2013-09-22 DIAGNOSIS — Y9229 Other specified public building as the place of occurrence of the external cause: Secondary | ICD-10-CM | POA: Insufficient documentation

## 2013-09-22 DIAGNOSIS — M25561 Pain in right knee: Secondary | ICD-10-CM

## 2013-09-22 DIAGNOSIS — F329 Major depressive disorder, single episode, unspecified: Secondary | ICD-10-CM | POA: Insufficient documentation

## 2013-09-22 DIAGNOSIS — Z8739 Personal history of other diseases of the musculoskeletal system and connective tissue: Secondary | ICD-10-CM | POA: Insufficient documentation

## 2013-09-22 DIAGNOSIS — F3289 Other specified depressive episodes: Secondary | ICD-10-CM | POA: Insufficient documentation

## 2013-09-22 DIAGNOSIS — S93401A Sprain of unspecified ligament of right ankle, initial encounter: Secondary | ICD-10-CM

## 2013-09-22 DIAGNOSIS — W010XXA Fall on same level from slipping, tripping and stumbling without subsequent striking against object, initial encounter: Secondary | ICD-10-CM | POA: Insufficient documentation

## 2013-09-22 DIAGNOSIS — Z8742 Personal history of other diseases of the female genital tract: Secondary | ICD-10-CM | POA: Insufficient documentation

## 2013-09-22 DIAGNOSIS — S93409A Sprain of unspecified ligament of unspecified ankle, initial encounter: Secondary | ICD-10-CM | POA: Insufficient documentation

## 2013-09-22 DIAGNOSIS — W19XXXA Unspecified fall, initial encounter: Secondary | ICD-10-CM

## 2013-09-22 DIAGNOSIS — J438 Other emphysema: Secondary | ICD-10-CM | POA: Insufficient documentation

## 2013-09-22 DIAGNOSIS — Z79899 Other long term (current) drug therapy: Secondary | ICD-10-CM | POA: Insufficient documentation

## 2013-09-22 DIAGNOSIS — Z8719 Personal history of other diseases of the digestive system: Secondary | ICD-10-CM | POA: Insufficient documentation

## 2013-09-22 DIAGNOSIS — IMO0002 Reserved for concepts with insufficient information to code with codable children: Secondary | ICD-10-CM | POA: Insufficient documentation

## 2013-09-22 DIAGNOSIS — F17200 Nicotine dependence, unspecified, uncomplicated: Secondary | ICD-10-CM | POA: Insufficient documentation

## 2013-09-22 MED ORDER — KETOROLAC TROMETHAMINE 60 MG/2ML IM SOLN
60.0000 mg | Freq: Once | INTRAMUSCULAR | Status: AC
  Administered 2013-09-22: 60 mg via INTRAMUSCULAR
  Filled 2013-09-22: qty 2

## 2013-09-22 NOTE — ED Provider Notes (Signed)
CSN: [id number]     Arrival date & time 09/22/13  1935 History  This chart was scribed for non-physician practitioner Johnnette Gourd, PA-C, working with Hurman Horn, MD by Dorothey Baseman, ED Scribe. This patient was seen in room TR10C/TR10C and the patient's care was started at 9:00 PM.    Chief Complaint  Patient presents with  . Fall   The history is provided by the patient. No language interpreter was used.   HPI Comments: [name] is a 47 y.o. female with a history of fibromyalgia who presents to the Emergency Department complaining of a fall that occurred PTA when she states that she slipped on a wet, tile floor and landed on her back. She denies hitting her head or loss of consciousness. She reports associated pain to the right knee, right ankle, and lower back secondary to the incident, 10/10 currently. She reports some associated, mild numbness to the bilateral legs. She denies any extremity weakness. She denies taking any medications at home to treat her symptoms.  Past Medical History  Diagnosis Date  . Abscess     rectal  . COPD (chronic obstructive pulmonary disease)   . Emphysema of lung   . Depression   . Headache(784.0)   . Fibromyalgia   . Ovarian cyst   . Endometriosis    Past Surgical History  Procedure Laterality Date  . Appendectomy  1993  . Abdominal hysterectomy  1993  . Cholecystectomy    . Breast surgery  2008    left breast lumpectomy  . Examination under anesthesia  02/15/2012    Procedure: EXAM UNDER ANESTHESIA;  Surgeon: Maisie Fus A. Cornett, MD;  Location: [facility];  Service: General;  Laterality: Left;  Excision of perineal cyst   Family History  Problem Relation Age of Onset  . Cancer Father     lung  . Other Neg Hx    History  Substance Use Topics  . Smoking status: Current Every Day Smoker -- 1.00 packs/day for 30 years    Types: Cigarettes  . Smokeless tobacco: Never Used  . Alcohol Use: No   OB History   Grav Para  Term Preterm Abortions TAB SAB Ect Mult Living   2 2 2  0 0 0 0 0 0 2     Review of Systems  A complete 10 system review of systems was obtained and all systems are negative except as noted in the HPI and PMH.   Allergies  Cortisone acetate and Vicodin  Home Medications   Current Outpatient Rx  Name  Route  Sig  Dispense  Refill  . albuterol (PROVENTIL HFA;VENTOLIN HFA) 108 (90 BASE) MCG/ACT inhaler   Inhalation   Inhale 2 puffs into the lungs every 6 (six) hours as needed.         . ALPRAZolam (XANAX XR) 1 MG 24 hr tablet   Oral   Take 1 mg by mouth every morning.         Marland Kitchen ALPRAZolam (XANAX) 1 MG tablet   Oral   Take 1 mg by mouth 3 (three) times daily as needed.         . carbamazepine (EQUETRO) 200 MG CP12 12 hr capsule   Oral   Take 400 mg by mouth at bedtime.         Marland Kitchen lisdexamfetamine (VYVANSE) 50 MG capsule   Oral   Take 50 mg by mouth daily.         Marland Kitchen lisdexamfetamine (  VYVANSE) 70 MG capsule   Oral   Take 70 mg by mouth daily.         Marland Kitchen tiotropium (SPIRIVA) 18 MCG inhalation capsule   Inhalation   Place 18 mcg into inhaler and inhale daily.          Triage Vitals: BP 108/64  Pulse 61  Temp(Src) 97.3 F (36.3 C) (Oral)  Resp 18  Ht 5\' 2"  (1.575 m)  Wt 177 lb 1.6 oz (80.332 kg)  BMI 32.38 kg/m2  SpO2 98%  Physical Exam  Nursing note and vitals reviewed. Constitutional: She is oriented to person, place, and time. She appears well-developed and well-nourished. No distress.  HENT:  Head: Normocephalic and atraumatic.  Mouth/Throat: Oropharynx is clear and moist.  Eyes: Conjunctivae and EOM are normal.  Neck: Normal range of motion. Neck supple.  Cardiovascular: Normal rate, regular rhythm and normal heart sounds.  Exam reveals no gallop and no friction rub.   No murmur heard. Pulmonary/Chest: Effort normal and breath sounds normal. No respiratory distress. She has no wheezes. She has no rales.  Musculoskeletal: Normal range of motion.  She exhibits no edema.  Mild swelling to the right lateral malleolus with tenderness. Full range of motion.  Tenderness throughout the entire, right patella. No deformity or swelling.  Tenderness of her right paralumbar muscles.  Neurological: She is alert and oriented to person, place, and time. No sensory deficit.  Normal strength and sensation throughout.   Skin: Skin is warm and dry.  Psychiatric: She has a normal mood and affect. Her behavior is normal.    ED Course  Procedures (including critical care time)  DIAGNOSTIC STUDIES: Oxygen Saturation is 98% on room air, normal by my interpretation.    COORDINATION OF CARE: 9:04 PM- Will order x-rays of the right knee and right ankle. Will order an injection of Toradol. Discussed treatment plan with patient at bedside and patient verbalized agreement.   10:22 PM- Discussed that x-ray results were negative. Discussed treatment plan with patient at bedside and patient verbalized agreement.    Labs Review Labs Reviewed - No data to display  Imaging Review Dg Ankle Complete Right  09/22/2013   CLINICAL DATA:  Fall, pain lateral ankle  EXAM: RIGHT ANKLE - COMPLETE 3+ VIEW  COMPARISON:  09/24/2008  FINDINGS: There is no evidence of fracture, dislocation, or joint effusion. There is no evidence of arthropathy or other focal bone abnormality. Soft tissues are unremarkable.  IMPRESSION: Negative.   Electronically Signed   By: Esperanza Heir M.D.   On: 09/22/2013 22:15   Dg Knee Complete 4 Views Right  09/22/2013   CLINICAL DATA:  Fall, pain lateral side of knee  EXAM: RIGHT KNEE - COMPLETE 4+ VIEW  COMPARISON:  None.  FINDINGS: There is no evidence of fracture, dislocation, or joint effusion. There is no evidence of arthropathy or other focal bone abnormality. Soft tissues are unremarkable.  IMPRESSION: Negative.   Electronically Signed   By: Esperanza Heir M.D.   On: 09/22/2013 22:15    EKG Interpretation   None       MDM   1.  Fall, initial encounter   2. Ankle sprain, right, initial encounter   3. Knee pain, right    Xray of ankle and knee normal. Neurovascularly intact. Ace wrap and crutches given. Discussed RICE protocol.  No red flags concerning patient's back pain. No s/s of central cord compression or cauda equina. Lower extremities are neurovascularly intact. Return precautions given. Patient  states understanding of treatment care plan and is agreeable.   I personally performed the services described in this documentation, which was scribed in my presence. The recorded information has been reviewed and is accurate.     Trevor Mace, PA-C 09/22/13 2227

## 2013-09-22 NOTE — Discharge Instructions (Signed)
Rest, ice and elevate your knee and ankle. Use the crutches for the next 2 days and weight bear as tolerable. Take ibuprofen 600-800 mg every 6-8 hours for pain.  Ankle Sprain An ankle sprain is an injury to the strong, fibrous tissues (ligaments) that hold the bones of your ankle joint together.  CAUSES An ankle sprain is usually caused by a fall or by twisting your ankle. Ankle sprains most commonly occur when you step on the outer edge of your foot, and your ankle turns inward. People who participate in sports are more prone to these types of injuries.  SYMPTOMS   Pain in your ankle. The pain may be present at rest or only when you are trying to stand or walk.  Swelling.  Bruising. Bruising may develop immediately or within 1 to 2 days after your injury.  Difficulty standing or walking, particularly when turning corners or changing directions. DIAGNOSIS  Your caregiver will ask you details about your injury and perform a physical exam of your ankle to determine if you have an ankle sprain. During the physical exam, your caregiver will press on and apply pressure to specific areas of your foot and ankle. Your caregiver will try to move your ankle in certain ways. An X-ray exam may be done to be sure a bone was not broken or a ligament did not separate from one of the bones in your ankle (avulsion fracture).  TREATMENT  Certain types of braces can help stabilize your ankle. Your caregiver can make a recommendation for this. Your caregiver may recommend the use of medicine for pain. If your sprain is severe, your caregiver may refer you to a surgeon who helps to restore function to parts of your skeletal system (orthopedist) or a physical therapist. HOME CARE INSTRUCTIONS   Apply ice to your injury for 1 2 days or as directed by your caregiver. Applying ice helps to reduce inflammation and pain.  Put ice in a plastic bag.  Place a towel between your skin and the bag.  Leave the ice on for  15-20 minutes at a time, every 2 hours while you are awake.  Only take over-the-counter or prescription medicines for pain, discomfort, or fever as directed by your caregiver.  Elevate your injured ankle above the level of your heart as much as possible for 2 3 days.  If your caregiver recommends crutches, use them as instructed. Gradually put weight on the affected ankle. Continue to use crutches or a cane until you can walk without feeling pain in your ankle.  If you have a plaster splint, wear the splint as directed by your caregiver. Do not rest it on anything harder than a pillow for the first 24 hours. Do not put weight on it. Do not get it wet. You may take it off to take a shower or bath.  You may have been given an elastic bandage to wear around your ankle to provide support. If the elastic bandage is too tight (you have numbness or tingling in your foot or your foot becomes cold and blue), adjust the bandage to make it comfortable.  If you have an air splint, you may blow more air into it or let air out to make it more comfortable. You may take your splint off at night and before taking a shower or bath. Wiggle your toes in the splint several times per day to decrease swelling. SEEK MEDICAL CARE IF:   You have rapidly increasing bruising or swelling.  Your toes feel extremely cold or you lose feeling in your foot.  Your pain is not relieved with medicine. SEEK IMMEDIATE MEDICAL CARE IF:  Your toes are numb or blue.  You have severe pain that is increasing. MAKE SURE YOU:   Understand these instructions.  Will watch your condition.  Will get help right away if you are not doing well or get worse. Document Released: 10/18/2005 Document Revised: 07/12/2012 Document Reviewed: 10/30/2011 [facility] Patient Information 2014 [place], [place].  Knee Pain Knee pain can be a result of an injury or other medical conditions. Treatment will depend on the cause of your pain. HOME  CARE  Only take medicine as told by your doctor.  Keep a healthy weight. Being overweight can make the knee hurt more.  Stretch before exercising or playing sports.  If there is constant knee pain, change the way you exercise. Ask your doctor for advice.  Make sure shoes fit well. Choose the right shoe for the sport or activity.  Protect your knees. Wear kneepads if needed.  Rest when you are tired. GET HELP RIGHT AWAY IF:   Your knee pain does not stop.  Your knee pain does not get better.  Your knee joint feels hot to the touch.  You have a fever. MAKE SURE YOU:   Understand these instructions.  Will watch this condition.  Will get help right away if you are not doing well or get worse. Document Released: 01/14/2009 Document Revised: 01/10/2012 Document Reviewed: 01/14/2009 [employer] Patient Information 2014 [place], [place].

## 2013-09-22 NOTE — ED Notes (Signed)
Patient presents with c/o hurting all over.  Was shopping at [facility] and slipped on water in the produce department.  EMS responded but patient did nto want to come with them.  Has been getting more sore since the accident

## 2013-09-23 NOTE — ED Provider Notes (Signed)
Medical screening examination/treatment/procedure(s) were performed by non-physician practitioner and as supervising physician I was immediately available for consultation/collaboration.  Lanise Mergen M Bell Carbo, MD 09/23/13 1243 

## 2013-09-24 ENCOUNTER — Emergency Department (HOSPITAL_COMMUNITY)
Admission: EM | Admit: 2013-09-24 | Discharge: 2013-09-24 | Disposition: A | Discharge status: Home / Self Care | Payer: No Typology Code available for payment source | Attending: Emergency Medicine | Admitting: Emergency Medicine

## 2013-09-24 ENCOUNTER — Encounter (HOSPITAL_COMMUNITY): Payer: Self-pay | Admitting: Emergency Medicine

## 2013-09-24 DIAGNOSIS — J4489 Other specified chronic obstructive pulmonary disease: Secondary | ICD-10-CM | POA: Insufficient documentation

## 2013-09-24 DIAGNOSIS — F3289 Other specified depressive episodes: Secondary | ICD-10-CM | POA: Insufficient documentation

## 2013-09-24 DIAGNOSIS — Z872 Personal history of diseases of the skin and subcutaneous tissue: Secondary | ICD-10-CM | POA: Insufficient documentation

## 2013-09-24 DIAGNOSIS — Z79899 Other long term (current) drug therapy: Secondary | ICD-10-CM | POA: Insufficient documentation

## 2013-09-24 DIAGNOSIS — J449 Chronic obstructive pulmonary disease, unspecified: Secondary | ICD-10-CM | POA: Insufficient documentation

## 2013-09-24 DIAGNOSIS — G8921 Chronic pain due to trauma: Secondary | ICD-10-CM | POA: Insufficient documentation

## 2013-09-24 DIAGNOSIS — F17200 Nicotine dependence, unspecified, uncomplicated: Secondary | ICD-10-CM | POA: Insufficient documentation

## 2013-09-24 DIAGNOSIS — R61 Generalized hyperhidrosis: Secondary | ICD-10-CM | POA: Insufficient documentation

## 2013-09-24 DIAGNOSIS — M545 Low back pain, unspecified: Secondary | ICD-10-CM

## 2013-09-24 DIAGNOSIS — F329 Major depressive disorder, single episode, unspecified: Secondary | ICD-10-CM | POA: Insufficient documentation

## 2013-09-24 DIAGNOSIS — M25519 Pain in unspecified shoulder: Secondary | ICD-10-CM | POA: Insufficient documentation

## 2013-09-24 DIAGNOSIS — Z8742 Personal history of other diseases of the female genital tract: Secondary | ICD-10-CM | POA: Insufficient documentation

## 2013-09-24 DIAGNOSIS — G8911 Acute pain due to trauma: Secondary | ICD-10-CM | POA: Insufficient documentation

## 2013-09-24 MED ORDER — METHOCARBAMOL 500 MG PO TABS
1000.0000 mg | ORAL_TABLET | Freq: Once | ORAL | Status: AC
  Administered 2013-09-24: 1000 mg via ORAL
  Filled 2013-09-24: qty 2

## 2013-09-24 NOTE — ED Provider Notes (Signed)
CSN: [id number]     Arrival date & time 09/24/13  1446 History  This chart was scribed for non-physician practitioner Trixie Dredge, PA-C, working with Shon Baton, MD by Blanchard Kelch, ED Scribe. This patient was seen in room TR05C/TR05C and the patient's care was started at 4:21 PM.    Chief Complaint  Patient presents with  . Back Pain   The history is provided by the patient. No language interpreter was used.   HPI Comments: [name] is a 47 y.o. female presents to the Emergency Department complaining of a fall that occurred two days ago when she slipped on tile at [facility] and landed on her back. She was seen in the ED on the day of the fall and was discharged after x-rays of her knee and ankle came back normal. She is now complaining of constant lower back pain that began after the fall but worsened last night. The pain is worsened with movement. She has associated diaphoresis with the pain. She has been taking Ibuprofen at home as well as using heat and ice without relief. She denies weakness or numbness in her lower extremities, bowel or urinary incontinence, fever, abdominal pain, dysuria, frequency, urgency, hematuria, constipation, diarrhea, melena, vaginal discharge, or vaginal bleeding. She is currently taking 10-325 mg Percocet for chronic shoulder pain from an injury in 2007. She is on a contract with a pain clinic.     Past Medical History  Diagnosis Date  . Abscess     rectal  . COPD (chronic obstructive pulmonary disease)   . Emphysema of lung   . Depression   . Headache(784.0)   . Fibromyalgia   . Ovarian cyst   . Endometriosis    Past Surgical History  Procedure Laterality Date  . Appendectomy  1993  . Abdominal hysterectomy  1993  . Cholecystectomy    . Breast surgery  2008    left breast lumpectomy  . Examination under anesthesia  02/15/2012    Procedure: EXAM UNDER ANESTHESIA;  Surgeon: Maisie Fus A. Cornett, MD;  Location: [facility];   Service: General;  Laterality: Left;  Excision of perineal cyst   Family History  Problem Relation Age of Onset  . Cancer Father     lung  . Other Neg Hx    History  Substance Use Topics  . Smoking status: Current Every Day Smoker -- 1.00 packs/day for 30 years    Types: Cigarettes  . Smokeless tobacco: Never Used  . Alcohol Use: No   OB History   Grav Para Term Preterm Abortions TAB SAB Ect Mult Living   2 2 2  0 0 0 0 0 0 2     Review of Systems  Constitutional: Positive for diaphoresis. Negative for fever.  Gastrointestinal: Negative for abdominal pain, diarrhea, constipation and blood in stool.  Genitourinary: Negative for dysuria, urgency, frequency, hematuria, vaginal bleeding and vaginal discharge.  Musculoskeletal: Positive for back pain.  Neurological: Negative for weakness and numbness.    Allergies  Cortisone acetate and Vicodin  Home Medications   Current Outpatient Rx  Name  Route  Sig  Dispense  Refill  . albuterol (PROVENTIL HFA;VENTOLIN HFA) 108 (90 BASE) MCG/ACT inhaler   Inhalation   Inhale 2 puffs into the lungs every 6 (six) hours as needed for wheezing or shortness of breath.          . ALPRAZolam (XANAX XR) 1 MG 24 hr tablet   Oral   Take 1 mg  by mouth every morning.         Marland Kitchen ALPRAZolam (XANAX) 1 MG tablet   Oral   Take 1 mg by mouth 3 (three) times daily as needed for anxiety.          . carbamazepine (EQUETRO) 200 MG CP12 12 hr capsule   Oral   Take 400 mg by mouth at bedtime.         Marland Kitchen FLUoxetine HCl (PROZAC PO)   Oral   Take 150 mg by mouth at bedtime.          Marland Kitchen lisdexamfetamine (VYVANSE) 70 MG capsule   Oral   Take 70 mg by mouth daily.         Marland Kitchen oxyCODONE-acetaminophen (PERCOCET) 10-325 MG per tablet   Oral   Take 1 tablet by mouth every 6 (six) hours as needed for pain.         . Pregabalin (LYRICA PO)   Oral   Take 175 mg by mouth daily.         . promethazine (PHENERGAN) 25 MG tablet   Oral   Take 25  mg by mouth every 6 (six) hours as needed for nausea or vomiting (migraine).         . rizatriptan (MAXALT) 10 MG tablet   Oral   Take 20 mg by mouth daily as needed for migraine. May repeat in 2 hours if needed         . tiotropium (SPIRIVA) 18 MCG inhalation capsule   Inhalation   Place 18 mcg into inhaler and inhale daily.         Marland Kitchen tiZANidine (ZANAFLEX) 4 MG tablet   Oral   Take 4 mg by mouth every 6 (six) hours as needed for muscle spasms.          Triage Vitals: BP 107/56  Pulse 74  Temp(Src) 98.3 F (36.8 C)  Resp 16  SpO2 98%  Physical Exam  Nursing note and vitals reviewed. Constitutional: She appears well-developed and well-nourished. No distress.  HENT:  Head: Normocephalic and atraumatic.  Neck: Neck supple.  Cardiovascular: Intact distal pulses.   Lower extremities: Distal pulses intact  Pulmonary/Chest: Effort normal.  Abdominal: Soft. There is no tenderness. There is no rebound and no guarding.  Musculoskeletal:       Back:  Spine nontender, no crepitus, or stepoffs. Tenderness over right paraspinal area. Normal gait  Neurological: She is alert.  Lower extremities:  Strength 5/5, sensation intact,   Skin: Skin is warm and dry. She is not diaphoretic.    ED Course  Procedures (including critical care time)  DIAGNOSTIC STUDIES: Oxygen Saturation is 98% on room air, normal by my interpretation.    COORDINATION OF CARE:  4:47 PM - Patient verbalizes understanding and agrees with treatment plan.    Labs Review Labs Reviewed - No data to display Imaging Review Dg Ankle Complete Right  09/22/2013   CLINICAL DATA:  Fall, pain lateral ankle  EXAM: RIGHT ANKLE - COMPLETE 3+ VIEW  COMPARISON:  09/24/2008  FINDINGS: There is no evidence of fracture, dislocation, or joint effusion. There is no evidence of arthropathy or other focal bone abnormality. Soft tissues are unremarkable.  IMPRESSION: Negative.   Electronically Signed   By: Esperanza Heir  M.D.   On: 09/22/2013 22:15   Dg Knee Complete 4 Views Right  09/22/2013   CLINICAL DATA:  Fall, pain lateral side of knee  EXAM: RIGHT KNEE - COMPLETE 4+ VIEW  COMPARISON:  None.  FINDINGS: There is no evidence of fracture, dislocation, or joint effusion. There is no evidence of arthropathy or other focal bone abnormality. Soft tissues are unremarkable.  IMPRESSION: Negative.   Electronically Signed   By: Esperanza Heir M.D.   On: 09/22/2013 22:15    EKG Interpretation   None       MDM   1. Low back pain    Patient with history of chronic pain mechanical fall 2 days ago complaining of right lower back pain. The pain does not radiate. She is neurologically intact. No red flags for back pain. She is on a contract with the pain clinic. I have given her Robaxin here and advised her to followup with her pain management provider. Discussed findings, treatment, and follow up  with patient.  Pt given return precautions.  Pt verbalizes understanding and agrees with plan.      I doubt any other EMC precluding discharge at this time including, but not necessarily limited to the following: cauda equina, acute cord compression or significant nerve root compression, ruptured disk, AAA, fracture, tumor, or infection.    I personally performed the services described in this documentation, which was scribed in my presence. The recorded information has been reviewed and is accurate.     Trixie Dredge, PA-C 09/24/13 1701

## 2013-09-24 NOTE — ED Notes (Addendum)
States she Fell at [place] on sat and now having  Cont back pain no new injury pt states drove herself

## 2013-09-25 NOTE — ED Provider Notes (Signed)
Medical screening examination/treatment/procedure(s) were performed by non-physician practitioner and as supervising physician I was immediately available for consultation/collaboration.  EKG Interpretation   None        Courtney F Horton, MD 09/25/13 1043 

## 2013-10-15 ENCOUNTER — Other Ambulatory Visit: Payer: Self-pay | Admitting: Pain Medicine

## 2013-10-15 DIAGNOSIS — M545 Low back pain, unspecified: Secondary | ICD-10-CM

## 2013-10-16 ENCOUNTER — Ambulatory Visit
Admission: RE | Admit: 2013-10-16 | Discharge: 2013-10-16 | Disposition: A | Discharge status: Home / Self Care | Payer: Medicare Other | Source: Ambulatory Visit | Attending: Pain Medicine | Admitting: Pain Medicine

## 2013-10-16 DIAGNOSIS — M545 Low back pain, unspecified: Secondary | ICD-10-CM

## 2013-11-01 DIAGNOSIS — J849 Interstitial pulmonary disease, unspecified: Secondary | ICD-10-CM

## 2013-11-01 HISTORY — DX: Interstitial pulmonary disease, unspecified: J84.9

## 2013-11-07 ENCOUNTER — Encounter (HOSPITAL_COMMUNITY): Payer: Self-pay | Admitting: Emergency Medicine

## 2013-11-07 ENCOUNTER — Inpatient Hospital Stay (HOSPITAL_COMMUNITY)
Admission: EM | Admit: 2013-11-07 | Discharge: 2013-11-09 | DRG: 190 | Disposition: A | Discharge status: Home / Self Care | Payer: Medicare Other | Attending: Internal Medicine | Admitting: Internal Medicine

## 2013-11-07 ENCOUNTER — Emergency Department (HOSPITAL_COMMUNITY): Payer: Medicare Other

## 2013-11-07 DIAGNOSIS — G4700 Insomnia, unspecified: Secondary | ICD-10-CM

## 2013-11-07 DIAGNOSIS — J189 Pneumonia, unspecified organism: Secondary | ICD-10-CM

## 2013-11-07 DIAGNOSIS — Z79899 Other long term (current) drug therapy: Secondary | ICD-10-CM

## 2013-11-07 DIAGNOSIS — F17200 Nicotine dependence, unspecified, uncomplicated: Secondary | ICD-10-CM | POA: Diagnosis present

## 2013-11-07 DIAGNOSIS — F319 Bipolar disorder, unspecified: Secondary | ICD-10-CM

## 2013-11-07 DIAGNOSIS — J441 Chronic obstructive pulmonary disease with (acute) exacerbation: Secondary | ICD-10-CM

## 2013-11-07 DIAGNOSIS — F329 Major depressive disorder, single episode, unspecified: Secondary | ICD-10-CM | POA: Diagnosis present

## 2013-11-07 DIAGNOSIS — Z23 Encounter for immunization: Secondary | ICD-10-CM

## 2013-11-07 DIAGNOSIS — IMO0001 Reserved for inherently not codable concepts without codable children: Secondary | ICD-10-CM

## 2013-11-07 DIAGNOSIS — F3289 Other specified depressive episodes: Secondary | ICD-10-CM | POA: Diagnosis present

## 2013-11-07 DIAGNOSIS — B9789 Other viral agents as the cause of diseases classified elsewhere: Secondary | ICD-10-CM

## 2013-11-07 DIAGNOSIS — N3946 Mixed incontinence: Secondary | ICD-10-CM

## 2013-11-07 LAB — BASIC METABOLIC PANEL
BUN: 6 mg/dL (ref 6–23)
CO2: 23 mEq/L (ref 19–32)
Calcium: 9.5 mg/dL (ref 8.4–10.5)
Chloride: 103 mEq/L (ref 96–112)
Creatinine, Ser: 0.6 mg/dL (ref 0.50–1.10)
GFR calc Af Amer: >90 mL/min (ref >90)
GFR calc non Af Amer: >90 mL/min (ref >90)
Glucose, Bld: 116 mg/dL — ABNORMAL HIGH (ref 70–99)
Potassium: 3.9 mEq/L (ref 3.7–5.3)
Sodium: 141 mEq/L (ref 137–147)

## 2013-11-07 LAB — CBC
HCT: 36.3 % (ref 36.0–46.0)
Hemoglobin: 12.3 g/dL (ref 12.0–15.0)
MCH: 30.8 pg (ref 26.0–34.0)
MCHC: 33.9 g/dL (ref 30.0–36.0)
MCV: 90.8 fL (ref 78.0–100.0)
Platelets: 254 10*3/uL (ref 150–400)
RBC: 4 MIL/uL (ref 3.87–5.11)
RDW: 13.7 % (ref 11.5–15.5)
WBC: 9.4 10*3/uL (ref 4.0–10.5)

## 2013-11-07 LAB — PRO B NATRIURETIC PEPTIDE: Pro B Natriuretic peptide (BNP): 158 pg/mL — ABNORMAL HIGH (ref 0–125)

## 2013-11-07 LAB — POCT I-STAT TROPONIN I: Troponin i, poc: 0.01 ng/mL (ref 0.00–0.08)

## 2013-11-07 MED ORDER — MORPHINE SULFATE 4 MG/ML IJ SOLN
4.0000 mg | Freq: Once | INTRAMUSCULAR | Status: AC
  Administered 2013-11-07: 4 mg via INTRAVENOUS
  Filled 2013-11-07: qty 1

## 2013-11-07 MED ORDER — DEXTROSE 5 % IV SOLN
500.0000 mg | Freq: Once | INTRAVENOUS | Status: AC
  Administered 2013-11-07: 500 mg via INTRAVENOUS
  Filled 2013-11-07: qty 500

## 2013-11-07 MED ORDER — DEXTROSE 5 % IV SOLN
1.0000 g | Freq: Once | INTRAVENOUS | Status: AC
  Administered 2013-11-07: 1 g via INTRAVENOUS
  Filled 2013-11-07: qty 10

## 2013-11-07 MED ORDER — ALBUTEROL SULFATE (2.5 MG/3ML) 0.083% IN NEBU
5.0000 mg | INHALATION_SOLUTION | Freq: Once | RESPIRATORY_TRACT | Status: AC
  Administered 2013-11-07: 5 mg via RESPIRATORY_TRACT
  Filled 2013-11-07: qty 6

## 2013-11-07 MED ORDER — SODIUM CHLORIDE 0.9 % IV SOLN
Freq: Once | INTRAVENOUS | Status: AC
  Administered 2013-11-07: 21:00:00 via INTRAVENOUS

## 2013-11-07 MED ORDER — ONDANSETRON HCL 4 MG/2ML IJ SOLN
4.0000 mg | Freq: Once | INTRAMUSCULAR | Status: AC
  Administered 2013-11-07: 4 mg via INTRAVENOUS
  Filled 2013-11-07: qty 2

## 2013-11-07 MED ORDER — MORPHINE SULFATE 4 MG/ML IJ SOLN
4.0000 mg | Freq: Once | INTRAMUSCULAR | Status: AC
  Administered 2013-11-08: 4 mg via INTRAVENOUS
  Filled 2013-11-07: qty 1

## 2013-11-07 MED ORDER — IPRATROPIUM BROMIDE 0.02 % IN SOLN
0.5000 mg | Freq: Once | RESPIRATORY_TRACT | Status: AC
  Administered 2013-11-07: 0.5 mg via RESPIRATORY_TRACT
  Filled 2013-11-07: qty 2.5

## 2013-11-07 NOTE — ED Notes (Signed)
Ambulated patient in hallway with pulse ox. While ambulating patient dropped from 95% to 90% and heart rate increased from 83 to 105.

## 2013-11-07 NOTE — ED Notes (Signed)
Presents with one week of SOB, cough with green sputum,  Nausea, pain with deep inspiration and sternal chest pain. Bilateral breath sounds diminished, Sats 92% RA, +smoker with COPD, does not use O2 home. Sats increased to 96% on 2L O2

## 2013-11-07 NOTE — ED Provider Notes (Signed)
CSN: [id number]     Arrival date & time 11/07/13  1614 History   First MD Initiated Contact with Patient 11/07/13 1957     Chief Complaint  Patient presents with  . Shortness of Breath   (Consider location/radiation/quality/duration/timing/severity/associated sxs/prior Treatment) HPI Comments: Patient is a 48 year old female past medical history significant for COPD, depression, headaches, fibromyalgia presented to the emergency department for one week of worsening upper respiratory symptoms. Patient states one week ago her symptoms began with a dry cough began to progress to productive cough with green sputum, nasal congestion, rhinorrhea, myalgias, nausea. Patient states over the last couple days she has had inspiratory sternal chest pain with worsening shortness of breath from baseline. Patient states she is not on oxygen at home, she does use history of and Provera at home. Patient states she has been using his medications as prescribed, plus increased doses of Pro Air without relief of her symptoms.   Past Medical History  Diagnosis Date  . Abscess     rectal  . COPD (chronic obstructive pulmonary disease)   . Emphysema of lung   . Depression   . Headache(784.0)   . Fibromyalgia   . Ovarian cyst   . Endometriosis    Past Surgical History  Procedure Laterality Date  . Appendectomy  1993  . Abdominal hysterectomy  1993  . Cholecystectomy    . Breast surgery  2008    left breast lumpectomy  . Examination under anesthesia  02/15/2012    Procedure: EXAM UNDER ANESTHESIA;  Surgeon: Maisie Fus A. Cornett, MD;  Location: [facility];  Service: General;  Laterality: Left;  Excision of perineal cyst   Family History  Problem Relation Age of Onset  . Cancer Father     lung  . Other Neg Hx    History  Substance Use Topics  . Smoking status: Current Every Day Smoker -- 1.00 packs/day for 30 years    Types: Cigarettes  . Smokeless tobacco: Never Used  . Alcohol Use: No    OB History   Grav Para Term Preterm Abortions TAB SAB Ect Mult Living   2 2 2  0 0 0 0 0 0 2     Review of Systems  HENT: Positive for congestion.   Respiratory: Positive for cough, chest tightness and shortness of breath.   Cardiovascular: Positive for chest pain. Negative for leg swelling.  Gastrointestinal: Positive for nausea.  All other systems reviewed and are negative.    Allergies  Cortisone acetate and Vicodin  Home Medications   Current Outpatient Rx  Name  Route  Sig  Dispense  Refill  . albuterol (PROVENTIL HFA;VENTOLIN HFA) 108 (90 BASE) MCG/ACT inhaler   Inhalation   Inhale 2 puffs into the lungs every 6 (six) hours.          . ALPRAZolam (XANAX XR) 1 MG 24 hr tablet   Oral   Take 1 mg by mouth 2 (two) times daily.          Marland Kitchen ALPRAZolam (XANAX) 1 MG tablet   Oral   Take 1 mg by mouth 3 (three) times daily.          Marland Kitchen amphetamine-dextroamphetamine (ADDERALL XR) 15 MG 24 hr capsule   Oral   Take 15 mg by mouth every morning.         Marland Kitchen Dextromethorphan-Guaifenesin (MUCINEX DM MAXIMUM STRENGTH PO)   Oral   Take 20 mLs by mouth daily as needed (for cold).         Marland Kitchen  FLUoxetine HCl (PROZAC PO)   Oral   Take 80 mg by mouth at bedtime.          Marland Kitchen guaiFENesin-codeine (ROBITUSSIN AC) 100-10 MG/5ML syrup   Oral   Take 15 mLs by mouth daily as needed for cough or congestion.         Marland Kitchen oxyCODONE-acetaminophen (PERCOCET) 10-325 MG per tablet   Oral   Take 1 tablet by mouth every 6 (six) hours as needed for pain.         . pregabalin (LYRICA) 75 MG capsule   Oral   Take 75 mg by mouth 3 (three) times daily.         . promethazine (PHENERGAN) 25 MG tablet   Oral   Take 25 mg by mouth every 6 (six) hours as needed for nausea or vomiting (migraine).         Marland Kitchen tiotropium (SPIRIVA) 18 MCG inhalation capsule   Inhalation   Place 18 mcg into inhaler and inhale daily.         Marland Kitchen tiZANidine (ZANAFLEX) 4 MG tablet   Oral   Take 4 mg by  mouth every 6 (six) hours as needed for muscle spasms.         . rizatriptan (MAXALT) 10 MG tablet   Oral   Take 20 mg by mouth daily as needed for migraine. May repeat in 2 hours if needed          BP 104/57  Pulse 80  Temp(Src) 98.2 F (36.8 C) (Oral)  Resp 28  Wt 177 lb (80.287 kg)  SpO2 96% Physical Exam  Constitutional: She is oriented to person, place, and time. She appears well-developed and well-nourished. No distress. Nasal cannula in place.  HENT:  Head: Normocephalic and atraumatic.  Right Ear: External ear normal.  Left Ear: External ear normal.  Nose: Nose normal.  Eyes: Conjunctivae are normal.  Neck: Neck supple.  Cardiovascular: Normal rate, regular rhythm, normal heart sounds and intact distal pulses.   Pulmonary/Chest: Accessory muscle usage present. She has decreased breath sounds. She has wheezes. She exhibits tenderness.  Abdominal: Soft. Bowel sounds are normal. There is no tenderness.  Musculoskeletal: Normal range of motion. She exhibits no tenderness.  Neurological: She is alert and oriented to person, place, and time.  Skin: Skin is warm and dry. She is not diaphoretic. No pallor.    ED Course  Procedures (including critical care time) Medications  albuterol (PROVENTIL) (2.5 MG/3ML) 0.083% nebulizer solution 5 mg (5 mg Nebulization Given 11/07/13 1635)  albuterol (PROVENTIL) (2.5 MG/3ML) 0.083% nebulizer solution 5 mg (5 mg Nebulization Given 11/07/13 2044)  ipratropium (ATROVENT) nebulizer solution 0.5 mg (0.5 mg Nebulization Given 11/07/13 2044)  ondansetron (ZOFRAN) injection 4 mg (4 mg Intravenous Given 11/07/13 2039)  morphine 4 MG/ML injection 4 mg (4 mg Intravenous Given 11/07/13 2040)  cefTRIAXone (ROCEPHIN) 1 g in dextrose 5 % 50 mL IVPB (0 g Intravenous Stopped 11/07/13 2146)  azithromycin (ZITHROMAX) 500 mg in dextrose 5 % 250 mL IVPB (0 mg Intravenous Stopped 11/07/13 2351)  0.9 %  sodium chloride infusion ( Intravenous Stopped 11/08/13 0044)   albuterol (PROVENTIL) (2.5 MG/3ML) 0.083% nebulizer solution 5 mg (5 mg Nebulization Given 11/07/13 2144)  albuterol (PROVENTIL) (2.5 MG/3ML) 0.083% nebulizer solution 5 mg (5 mg Nebulization Given 11/07/13 2331)  morphine 4 MG/ML injection 4 mg (4 mg Intravenous Given 11/08/13 0000)    Labs Review Labs Reviewed  BASIC METABOLIC PANEL - Abnormal; Notable for the  following:    Glucose, Bld 116 (*)    All other components within normal limits  PRO B NATRIURETIC PEPTIDE - Abnormal; Notable for the following:    Pro B Natriuretic peptide (BNP) 158.0 (*)    All other components within normal limits  CBC  POCT I-STAT TROPONIN I   Imaging Review Dg Chest 2 View (if Patient Has Fever And/or Copd)  11/07/2013   CLINICAL DATA:  Shortness of breath, history smoking, COPD, fibromyalgia  EXAM: CHEST  2 VIEW  COMPARISON:  06/21/2013  FINDINGS: Upper-normal size of cardiac silhouette.  Mediastinal contours and pulmonary vascularity normal.  Mild chronic bronchitic changes.  Increased markings at medial right lung base, cannot exclude early right lower lobe infiltrate.  Remaining lungs clear.  No pleural effusion or pneumothorax.  Bones unremarkable.  IMPRESSION: Bronchitic changes with questionable medial right lower lobe infiltrate.   Electronically Signed   By: Ulyses SouthwardMark  Boles M.D.   On: 11/07/2013 17:54    EKG Interpretation   None       MDM   1. CAP (community acquired pneumonia)   2. COPD exacerbation     11:11 PM Patient does not have any improvement in symptoms after four nebulizer treatments. Will attempt to wean off of 2L oxygen and then ambulate patient with oxygen saturations and reassess. Patient will likely need admission for COPD exacerbation, new oxygen requirement.   Patient ambulated in hallway maintaining saturations 90%-95% with increased in heart rate.   Afebrile, NAD, non-toxic appearing, AAOx4. Patient with accessory muscle use, diminished breath sounds on initial examination.  Patient has been diagnosed with CAP via chest xray. Patient has apparent allergy to steroids (SOB), held off on giving that until verified. Four nebulizer treatments given with minimal resolution in symptoms. IV Antibiotics started for CAP. Will obs patient for further nebulizer treatments and oxygen via nasal cannula. Patient d/w with Dr. Deretha EmoryZackowski, agrees with plan.      Jeannetta EllisJennifer L Khayman Kirsch, PA-C 11/08/13 [phone]

## 2013-11-08 ENCOUNTER — Telehealth: Payer: Self-pay | Admitting: Oncology

## 2013-11-08 ENCOUNTER — Encounter (HOSPITAL_COMMUNITY): Payer: Self-pay | Admitting: *Deleted

## 2013-11-08 DIAGNOSIS — J189 Pneumonia, unspecified organism: Secondary | ICD-10-CM

## 2013-11-08 DIAGNOSIS — J441 Chronic obstructive pulmonary disease with (acute) exacerbation: Secondary | ICD-10-CM | POA: Diagnosis present

## 2013-11-08 LAB — CBC WITH DIFFERENTIAL/PLATELET
Basophils Absolute: 0 10*3/uL (ref 0.0–0.1)
Basophils Relative: 0 % (ref 0–1)
Eosinophils Absolute: 0 10*3/uL (ref 0.0–0.7)
Eosinophils Relative: 0 % (ref 0–5)
HCT: 35.4 % — ABNORMAL LOW (ref 36.0–46.0)
Hemoglobin: 12 g/dL (ref 12.0–15.0)
Lymphocytes Relative: 10 % — ABNORMAL LOW (ref 12–46)
Lymphs Abs: 0.7 10*3/uL (ref 0.7–4.0)
MCH: 31 pg (ref 26.0–34.0)
MCHC: 33.9 g/dL (ref 30.0–36.0)
MCV: 91.5 fL (ref 78.0–100.0)
Monocytes Absolute: 0.1 10*3/uL (ref 0.1–1.0)
Monocytes Relative: 2 % — ABNORMAL LOW (ref 3–12)
Neutro Abs: 6 10*3/uL (ref 1.7–7.7)
Neutrophils Relative %: 88 % — ABNORMAL HIGH (ref 43–77)
Platelets: 241 10*3/uL (ref 150–400)
RBC: 3.87 MIL/uL (ref 3.87–5.11)
RDW: 13.8 % (ref 11.5–15.5)
WBC: 6.8 10*3/uL (ref 4.0–10.5)

## 2013-11-08 LAB — CBC
HCT: 32.8 % — ABNORMAL LOW (ref 36.0–46.0)
Hemoglobin: 11.2 g/dL — ABNORMAL LOW (ref 12.0–15.0)
MCH: 31.2 pg (ref 26.0–34.0)
MCHC: 34.1 g/dL (ref 30.0–36.0)
MCV: 91.4 fL (ref 78.0–100.0)
Platelets: 240 10*3/uL (ref 150–400)
RBC: 3.59 MIL/uL — ABNORMAL LOW (ref 3.87–5.11)
RDW: 13.9 % (ref 11.5–15.5)
WBC: 7.1 10*3/uL (ref 4.0–10.5)

## 2013-11-08 LAB — STREP PNEUMONIAE URINARY ANTIGEN: Strep Pneumo Urinary Antigen: NEGATIVE

## 2013-11-08 LAB — COMPREHENSIVE METABOLIC PANEL
ALT: 17 U/L (ref 0–35)
AST: 16 U/L (ref 0–37)
Albumin: 3.1 g/dL — ABNORMAL LOW (ref 3.5–5.2)
Alkaline Phosphatase: 45 U/L (ref 39–117)
BUN: 10 mg/dL (ref 6–23)
CO2: 24 mEq/L (ref 19–32)
Calcium: 9.2 mg/dL (ref 8.4–10.5)
Chloride: 102 mEq/L (ref 96–112)
Creatinine, Ser: 0.49 mg/dL — ABNORMAL LOW (ref 0.50–1.10)
GFR calc Af Amer: >90 mL/min (ref >90)
GFR calc non Af Amer: >90 mL/min (ref >90)
Glucose, Bld: 188 mg/dL — ABNORMAL HIGH (ref 70–99)
Potassium: 4.2 mEq/L (ref 3.7–5.3)
Sodium: 140 mEq/L (ref 137–147)
Total Bilirubin: <0.2 mg/dL — ABNORMAL LOW (ref 0.3–1.2)
Total Protein: 7 g/dL (ref 6.0–8.3)

## 2013-11-08 LAB — LEGIONELLA ANTIGEN, URINE: Legionella Antigen, Urine: NEGATIVE

## 2013-11-08 LAB — EXPECTORATED SPUTUM ASSESSMENT W GRAM STAIN, RFLX TO RESP C

## 2013-11-08 LAB — HIV ANTIBODY (ROUTINE TESTING W REFLEX): HIV: NONREACTIVE

## 2013-11-08 LAB — CREATININE, SERUM
Creatinine, Ser: 0.57 mg/dL (ref 0.50–1.10)
GFR calc Af Amer: >90 mL/min (ref >90)
GFR calc non Af Amer: >90 mL/min (ref >90)

## 2013-11-08 LAB — EXPECTORATED SPUTUM ASSESSMENT W REFEX TO RESP CULTURE

## 2013-11-08 LAB — INFLUENZA PANEL BY PCR (TYPE A & B)
H1N1 flu by pcr: NOT DETECTED
Influenza A By PCR: NEGATIVE
Influenza B By PCR: NEGATIVE

## 2013-11-08 MED ORDER — FLUOXETINE HCL 20 MG PO CAPS
80.0000 mg | ORAL_CAPSULE | Freq: Every day | ORAL | Status: DC
  Administered 2013-11-08 – 2013-11-09 (×2): 80 mg via ORAL
  Filled 2013-11-08 (×2): qty 4

## 2013-11-08 MED ORDER — DEXTROSE 5 % IV SOLN
1.0000 g | INTRAVENOUS | Status: DC
  Administered 2013-11-08: 1 g via INTRAVENOUS
  Filled 2013-11-08 (×2): qty 10

## 2013-11-08 MED ORDER — TIZANIDINE HCL 4 MG PO TABS: 4.0000 mg | ORAL_TABLET | Freq: Four times a day (QID) | ORAL | Status: DC | PRN

## 2013-11-08 MED ORDER — DEXTROSE 5 % IV SOLN: 1.0000 g | INTRAVENOUS | Status: DC

## 2013-11-08 MED ORDER — ONDANSETRON HCL 4 MG/2ML IJ SOLN: 4.0000 mg | Freq: Four times a day (QID) | INTRAMUSCULAR | Status: DC | PRN

## 2013-11-08 MED ORDER — AMPHETAMINE-DEXTROAMPHET ER 5 MG PO CP24
15.0000 mg | ORAL_CAPSULE | Freq: Every day | ORAL | Status: DC
  Administered 2013-11-08: 15 mg via ORAL
  Filled 2013-11-08 (×2): qty 1

## 2013-11-08 MED ORDER — AMPHETAMINE-DEXTROAMPHET ER 5 MG PO CP24: 15.0000 mg | ORAL_CAPSULE | ORAL | Status: DC

## 2013-11-08 MED ORDER — INFLUENZA VAC SPLIT QUAD 0.5 ML IM SUSP
0.5000 mL | INTRAMUSCULAR | Status: AC
  Administered 2013-11-09: 0.5 mL via INTRAMUSCULAR
  Filled 2013-11-08: qty 0.5

## 2013-11-08 MED ORDER — METHYLPREDNISOLONE SODIUM SUCC 125 MG IJ SOLR
60.0000 mg | Freq: Two times a day (BID) | INTRAMUSCULAR | Status: DC
  Administered 2013-11-09: 60 mg via INTRAVENOUS
  Filled 2013-11-08 (×4): qty 0.96

## 2013-11-08 MED ORDER — PNEUMOCOCCAL VAC POLYVALENT 25 MCG/0.5ML IJ INJ
0.5000 mL | INJECTION | INTRAMUSCULAR | Status: AC
Start: 2013-11-09 — End: 2013-11-09
  Administered 2013-11-09: 0.5 mL via INTRAMUSCULAR
  Filled 2013-11-08: qty 0.5

## 2013-11-08 MED ORDER — METHYLPREDNISOLONE SODIUM SUCC 125 MG IJ SOLR
125.0000 mg | Freq: Three times a day (TID) | INTRAMUSCULAR | Status: DC
  Administered 2013-11-08 (×2): 125 mg via INTRAVENOUS
  Filled 2013-11-08 (×4): qty 2

## 2013-11-08 MED ORDER — SUMATRIPTAN SUCCINATE 100 MG PO TABS: 100.0000 mg | ORAL_TABLET | ORAL | Status: DC | PRN

## 2013-11-08 MED ORDER — OXYCODONE-ACETAMINOPHEN 10-325 MG PO TABS: 1.0000 | ORAL_TABLET | Freq: Four times a day (QID) | ORAL | Status: DC | PRN

## 2013-11-08 MED ORDER — ALPRAZOLAM 0.5 MG PO TABS
1.0000 mg | ORAL_TABLET | Freq: Two times a day (BID) | ORAL | Status: DC
  Administered 2013-11-08 – 2013-11-09 (×3): 1 mg via ORAL
  Filled 2013-11-08 (×3): qty 2

## 2013-11-08 MED ORDER — PROMETHAZINE HCL 25 MG PO TABS
25.0000 mg | ORAL_TABLET | Freq: Four times a day (QID) | ORAL | Status: DC | PRN
  Administered 2013-11-08: 25 mg via ORAL
  Filled 2013-11-08: qty 1

## 2013-11-08 MED ORDER — OSELTAMIVIR PHOSPHATE 75 MG PO CAPS
75.0000 mg | ORAL_CAPSULE | Freq: Two times a day (BID) | ORAL | Status: DC
  Administered 2013-11-08 (×2): 75 mg via ORAL
  Filled 2013-11-08 (×3): qty 1

## 2013-11-08 MED ORDER — OXYCODONE HCL 5 MG PO TABS
5.0000 mg | ORAL_TABLET | Freq: Four times a day (QID) | ORAL | Status: DC | PRN
  Administered 2013-11-08 – 2013-11-09 (×4): 5 mg via ORAL
  Filled 2013-11-08 (×4): qty 1

## 2013-11-08 MED ORDER — METHYLPREDNISOLONE SODIUM SUCC 125 MG IJ SOLR
125.0000 mg | Freq: Three times a day (TID) | INTRAMUSCULAR | Status: DC
  Filled 2013-11-08: qty 2

## 2013-11-08 MED ORDER — ALBUTEROL SULFATE (2.5 MG/3ML) 0.083% IN NEBU
2.5000 mg | INHALATION_SOLUTION | RESPIRATORY_TRACT | Status: DC
  Administered 2013-11-08 (×5): 2.5 mg via RESPIRATORY_TRACT
  Filled 2013-11-08 (×5): qty 3

## 2013-11-08 MED ORDER — DEXTROSE 5 % IV SOLN: 500.0000 mg | INTRAVENOUS | Status: DC

## 2013-11-08 MED ORDER — OXYCODONE-ACETAMINOPHEN 5-325 MG PO TABS
1.0000 | ORAL_TABLET | Freq: Four times a day (QID) | ORAL | Status: DC | PRN
  Administered 2013-11-08 – 2013-11-09 (×4): 1 via ORAL
  Filled 2013-11-08 (×4): qty 1

## 2013-11-08 MED ORDER — DEXTROSE 5 % IV SOLN
500.0000 mg | INTRAVENOUS | Status: DC
  Administered 2013-11-08: 500 mg via INTRAVENOUS
  Filled 2013-11-08 (×2): qty 500

## 2013-11-08 MED ORDER — ALPRAZOLAM ER 0.5 MG PO TB24
1.0000 mg | ORAL_TABLET | Freq: Two times a day (BID) | ORAL | Status: DC
  Filled 2013-11-08: qty 2

## 2013-11-08 MED ORDER — HEPARIN SODIUM (PORCINE) 5000 UNIT/ML IJ SOLN
5000.0000 [IU] | Freq: Three times a day (TID) | INTRAMUSCULAR | Status: DC
  Administered 2013-11-08 – 2013-11-09 (×4): 5000 [IU] via SUBCUTANEOUS
  Filled 2013-11-08 (×7): qty 1

## 2013-11-08 MED ORDER — BIOTENE DRY MOUTH MT LIQD
15.0000 mL | Freq: Two times a day (BID) | OROMUCOSAL | Status: DC
  Administered 2013-11-08 – 2013-11-09 (×3): 15 mL via OROMUCOSAL

## 2013-11-08 MED ORDER — ALBUTEROL SULFATE (2.5 MG/3ML) 0.083% IN NEBU: 2.5000 mg | INHALATION_SOLUTION | RESPIRATORY_TRACT | Status: DC | PRN

## 2013-11-08 MED ORDER — PROMETHAZINE HCL 25 MG/ML IJ SOLN
12.5000 mg | Freq: Once | INTRAMUSCULAR | Status: AC
  Administered 2013-11-08: 12.5 mg via INTRAVENOUS
  Filled 2013-11-08: qty 1

## 2013-11-08 MED ORDER — NICOTINE 21 MG/24HR TD PT24
21.0000 mg | MEDICATED_PATCH | Freq: Every day | TRANSDERMAL | Status: DC
  Administered 2013-11-08: 21 mg via TRANSDERMAL
  Filled 2013-11-08 (×2): qty 1

## 2013-11-08 MED ORDER — TIOTROPIUM BROMIDE MONOHYDRATE 18 MCG IN CAPS
18.0000 ug | ORAL_CAPSULE | Freq: Every day | RESPIRATORY_TRACT | Status: DC
  Administered 2013-11-08 – 2013-11-09 (×2): 18 ug via RESPIRATORY_TRACT
  Filled 2013-11-08: qty 5

## 2013-11-08 MED ORDER — MORPHINE SULFATE 2 MG/ML IJ SOLN
1.0000 mg | INTRAMUSCULAR | Status: DC | PRN
  Administered 2013-11-08 – 2013-11-09 (×3): 2 mg via INTRAVENOUS
  Filled 2013-11-08 (×4): qty 1

## 2013-11-08 MED ORDER — PREGABALIN 75 MG PO CAPS
75.0000 mg | ORAL_CAPSULE | Freq: Three times a day (TID) | ORAL | Status: DC
  Administered 2013-11-08 – 2013-11-09 (×4): 75 mg via ORAL
  Filled 2013-11-08 (×4): qty 1

## 2013-11-08 MED ORDER — ALBUTEROL SULFATE (2.5 MG/3ML) 0.083% IN NEBU
2.5000 mg | INHALATION_SOLUTION | Freq: Four times a day (QID) | RESPIRATORY_TRACT | Status: DC
  Administered 2013-11-09 (×2): 2.5 mg via RESPIRATORY_TRACT
  Filled 2013-11-08 (×2): qty 3

## 2013-11-08 NOTE — Telephone Encounter (Signed)
Patient scheduled to Dr. Welton FlakesKhan 02/3 @ 10 date/time per md.  Welcome packet mailed.

## 2013-11-08 NOTE — Progress Notes (Signed)
Patient admitted to 5w16 from ED. Patient is A&Ox4. Patient lives at home with husband. Patient placed on droplet precautions to r/o flu. Educated pt and pt's husband on droplet precautions. Patient's husband refuses to wear droplet mask. Patient's skin is warm, dry and intact.  Oriented pt to unit and room. Will continue to monitor patient. Nelda MarseilleJenny Thacker, RN

## 2013-11-08 NOTE — Progress Notes (Signed)
Called ED to get report. Nurse stated he would call back with report. Will continue to monitor. Nelda MarseilleJenny Thacker, RN

## 2013-11-08 NOTE — Telephone Encounter (Signed)
C/D 11/08/13 for appt.12/04/13

## 2013-11-08 NOTE — Progress Notes (Addendum)
Notified Schorr, NP via text page that patient is vomiting green vomit and po phenergan is ineffective. No call returned. Nelda MarseilleJenny Thacker, RN [phone]: Schorr, NP put in orders for Phenergan IV x1 and Zofran prn. Will continue to monitor patient. Nelda MarseilleJenny Thacker, RN

## 2013-11-08 NOTE — H&P (Signed)
Hospitalist Admission History and Physical  Patient name: [name] Medical record number: [id number] Date of birth: 1966/02/04 Age: 48 y.o. Gender: female  Primary Care Provider: Thayer Headings, MD  Chief Complaint: CAP, COPD exacerbation  History of Present Illness:This is a 48 y.o. year old female with significant past medical history of COPD (non oxygen dependent), 1 PPD smoker  presenting with CAP and COPD exacerbation. Pt reports worsening cough, dyspnea, wheezing, increased sputum production over the last week. Mild fevers and chills. Mild myalgias. Multiple sick contacts at home with similar sxs. Still smoking 1PPD. No CP.  Pt presented to ER with sxs. Received albuterol neb x 3 with minimal improvement in sxs. Had CXR that showed ? RLL infiltrate. Was initially satting in low 90s. Supplemental O2 placed.  Was ambulated s/p neb txs w/o O2. Still symptomatic, though O2 sats improved. Did not receive IM solumedrol due to ? allerguy with cortisone in the past ( pt reports that it made her throat feel tight). Had ESI in spine within the past week with no reaction to steroids.  Pt started on rocephin and azithromycin for CAP. No recent hospitalizations.   Patient Active Problem List   Diagnosis Date Noted  . COPD exacerbation 11/08/2013  . CAP (community acquired pneumonia) 11/08/2013  . VIRAL INFECTION 09/19/2007  . MIXED INCONTINENCE URGE AND STRESS 09/19/2007  . DISORDER, BIPOLAR NOS 06/26/2007  . CYSTITIS, ACUTE 06/26/2007  . FIBROMYALGIA 06/26/2007  . INSOMNIA 06/26/2007   Past Medical History: Past Medical History  Diagnosis Date  . Abscess     rectal  . COPD (chronic obstructive pulmonary disease)   . Emphysema of lung   . Depression   . Headache(784.0)   . Fibromyalgia   . Ovarian cyst   . Endometriosis     Past Surgical History: Past Surgical History  Procedure Laterality Date  . Appendectomy  1993  . Abdominal hysterectomy  1993  . Cholecystectomy    .  Breast surgery  2008    left breast lumpectomy  . Examination under anesthesia  02/15/2012    Procedure: EXAM UNDER ANESTHESIA;  Surgeon: Julie Fus A. Cornett, MD;  Location: [name];  Service: General;  Laterality: Left;  Excision of perineal cyst    Social History: History   Social History  . Marital Status: Married    Spouse Name: N/A    Number of Children: N/A  . Years of Education: N/A   Social History Main Topics  . Smoking status: Current Every Day Smoker -- 1.00 packs/day for 30 years    Types: Cigarettes  . Smokeless tobacco: Never Used  . Alcohol Use: No  . Drug Use: No  . Sexual Activity: Yes    Birth Control/ Protection: Surgical   Other Topics Concern  . None   Social History Narrative  . None    Family History: Family History  Problem Relation Age of Onset  . Cancer Father     lung  . Other Neg Hx     Allergies: Allergies  Allergen Reactions  . Cortisone Acetate Shortness Of Breath and Other (See Comments)    Makes patient feel as if she is choking.  . Vicodin [Hydrocodone-Acetaminophen] Itching    Current Facility-Administered Medications  Medication Dose Route Frequency Provider Last Rate Last Dose  . azithromycin (ZITHROMAX) 500 mg in dextrose 5 % 250 mL IVPB  500 mg Intravenous Q24H Doree Albee, MD      . cefTRIAXone (ROCEPHIN) 1 g in dextrose 5 %  50 mL IVPB  1 g Intravenous Q24H Doree Albee, MD      . heparin injection 5,000 Units  5,000 Units Subcutaneous Q8H Doree Albee, MD      . methylPREDNISolone sodium succinate (SOLU-MEDROL) 125 mg/2 mL injection 125 mg  125 mg Intramuscular Q8H Doree Albee, MD      . oseltamivir (TAMIFLU) capsule 75 mg  75 mg Oral BID Doree Albee, MD       Current Outpatient Prescriptions  Medication Sig Dispense Refill  . albuterol (PROVENTIL HFA;VENTOLIN HFA) 108 (90 BASE) MCG/ACT inhaler Inhale 2 puffs into the lungs every 6 (six) hours.       . ALPRAZolam (XANAX XR) 1 MG 24 hr tablet Take  1 mg by mouth 2 (two) times daily.       Marland Kitchen ALPRAZolam (XANAX) 1 MG tablet Take 1 mg by mouth 3 (three) times daily.       Marland Kitchen amphetamine-dextroamphetamine (ADDERALL XR) 15 MG 24 hr capsule Take 15 mg by mouth every morning.      Marland Kitchen Dextromethorphan-Guaifenesin (MUCINEX DM MAXIMUM STRENGTH PO) Take 20 mLs by mouth daily as needed (for cold).      Marland Kitchen FLUoxetine HCl (PROZAC PO) Take 80 mg by mouth at bedtime.       Marland Kitchen guaiFENesin-codeine (ROBITUSSIN AC) 100-10 MG/5ML syrup Take 15 mLs by mouth daily as needed for cough or congestion.      Marland Kitchen oxyCODONE-acetaminophen (PERCOCET) 10-325 MG per tablet Take 1 tablet by mouth every 6 (six) hours as needed for pain.      . pregabalin (LYRICA) 75 MG capsule Take 75 mg by mouth 3 (three) times daily.      . promethazine (PHENERGAN) 25 MG tablet Take 25 mg by mouth every 6 (six) hours as needed for nausea or vomiting (migraine).      Marland Kitchen tiotropium (SPIRIVA) 18 MCG inhalation capsule Place 18 mcg into inhaler and inhale daily.      Marland Kitchen tiZANidine (ZANAFLEX) 4 MG tablet Take 4 mg by mouth every 6 (six) hours as needed for muscle spasms.      . rizatriptan (MAXALT) 10 MG tablet Take 20 mg by mouth daily as needed for migraine. May repeat in 2 hours if needed       Review Of Systems: 12 point ROS negative except as noted above in HPI.  Physical Exam: Filed Vitals:   11/08/13 0045  BP: 104/57  Pulse: 80  Temp:   Resp: 28    General: alert and cooperative HEENT: PERRLA and extra ocular movement intact Heart: S1, S2 normal, no murmur, rub or gallop, regular rate and rhythm Lungs: rales, wheezing, expiratory rhonchi and expiratory wheezes, No resp distress Abdomen: abdomen is soft without significant tenderness, masses, organomegaly or guarding Extremities: extremities normal, atraumatic, no cyanosis or edema Skin:no rashes, no ecchymoses Neurology: normal without focal findings  Labs and Imaging: Lab Results  Component Value Date/Time   NA 141 11/07/2013  4:39  PM   K 3.9 11/07/2013  4:39 PM   CL 103 11/07/2013  4:39 PM   CO2 23 11/07/2013  4:39 PM   BUN 6 11/07/2013  4:39 PM   CREATININE 0.60 11/07/2013  4:39 PM   GLUCOSE 116* 11/07/2013  4:39 PM   Lab Results  Component Value Date   WBC 9.4 11/07/2013   HGB 12.3 11/07/2013   HCT 36.3 11/07/2013   MCV 90.8 11/07/2013   PLT 254 11/07/2013   Dg Chest 2 View (if Patient Has Fever  And/or Copd)  11/07/2013   CLINICAL DATA:  Shortness of breath, history smoking, COPD, fibromyalgia  EXAM: CHEST  2 VIEW  COMPARISON:  06/21/2013  FINDINGS: Upper-normal size of cardiac silhouette.  Mediastinal contours and pulmonary vascularity normal.  Mild chronic bronchitic changes.  Increased markings at medial right lung base, cannot exclude early right lower lobe infiltrate.  Remaining lungs clear.  No pleural effusion or pneumothorax.  Bones unremarkable.  IMPRESSION: Bronchitic changes with questionable medial right lower lobe infiltrate.   Electronically Signed   By: Ulyses SouthwardMark  Boles M.D.   On: 11/07/2013 17:54     Assessment and Plan: [name] is a 48 y.o. year old female presenting with CAP and COPD exacerbation.   Resp: Rocephin and Azithromycin for CAP coverage. Urine strep and legionella. Sputum culture. Start solumedrol (pharmacy to clarify-anticipate this shouldn't be an issue). Scheduled nebs. Tobacco cessation consult.   Neuro/Psych: continue adderall , lyrica   FEN/GI: Regular diet.  Prophylaxis: subq heparin  Disposition: pending further eval  Code Status:full code        Doree AlbeeSteven Newton MD  Pager: [phone]

## 2013-11-08 NOTE — Progress Notes (Signed)
Gave pt smoking cessation handout printed from [employer] notes. Will continue to monitor patient. Nelda MarseilleJenny Thacker, RN

## 2013-11-08 NOTE — Progress Notes (Signed)
Pt HR still 48 bpm this morning. MD notified. Cardiac monitoring initiated.

## 2013-11-08 NOTE — Progress Notes (Signed)
Patient seen and examined earlier today by my colleague Dr. Alvester MorinNewton. Patient seen and examined by me, data base reviewed. COPD exacerbation, negative flu PCR. Questionable right middle lobe pneumonia. Continue bronchodilators, mucolytics, antitussives and oxygen as needed.  Clint LippsMutaz A Karter Haire Pager: [phone]: [phone] 11/08/2013, 1:56 PM

## 2013-11-08 NOTE — Care Management Note (Unsigned)
    Page 1 of 1   11/08/2013     10:23:13 AM   CARE MANAGEMENT NOTE 11/08/2013  Patient:  [name],[name]   Account Number:  [id number]  Date Initiated:  11/08/2013  Documentation initiated by:  Letha CapeAYLOR,Monico Sudduth  Subjective/Objective Assessment:   dx copd ex, cap  admit- lives with spouse.     Action/Plan:   Anticipated DC Date:  11/10/2013   Anticipated DC Plan:  HOME/SELF CARE      DC Planning Services  CM consult      Choice offered to / List presented to:             Status of service:  In process, will continue to follow Medicare Important Message given?   (If response is "NO", the following Medicare IM given date fields will be blank) Date Medicare IM given:   Date Additional Medicare IM given:    Discharge Disposition:    Per UR Regulation:  Reviewed for med. necessity/level of care/duration of stay  If discussed at Long Length of Stay Meetings, dates discussed:    Comments:  11/08/13 10:22 Letha Capeeborah Tyreik Delahoussaye RN, BSN [phone] patient lives with spouse, pta indep, NCM will continue to follow for dc needs.

## 2013-11-08 NOTE — Progress Notes (Signed)
Notified Schorr,NP that patient's HR is 45. Pt is asymptomatic. No orders given. Will continue to monitor patient. Nelda MarseilleJenny Thacker, RN

## 2013-11-09 DIAGNOSIS — G4700 Insomnia, unspecified: Secondary | ICD-10-CM

## 2013-11-09 DIAGNOSIS — B9789 Other viral agents as the cause of diseases classified elsewhere: Secondary | ICD-10-CM

## 2013-11-09 DIAGNOSIS — IMO0001 Reserved for inherently not codable concepts without codable children: Secondary | ICD-10-CM

## 2013-11-09 DIAGNOSIS — F319 Bipolar disorder, unspecified: Secondary | ICD-10-CM

## 2013-11-09 DIAGNOSIS — N3946 Mixed incontinence: Secondary | ICD-10-CM

## 2013-11-09 DIAGNOSIS — J441 Chronic obstructive pulmonary disease with (acute) exacerbation: Principal | ICD-10-CM

## 2013-11-09 LAB — BASIC METABOLIC PANEL
BUN: 10 mg/dL (ref 6–23)
CO2: 25 mEq/L (ref 19–32)
Calcium: 9.4 mg/dL (ref 8.4–10.5)
Chloride: 101 mEq/L (ref 96–112)
Creatinine, Ser: 0.48 mg/dL — ABNORMAL LOW (ref 0.50–1.10)
GFR calc Af Amer: >90 mL/min (ref >90)
GFR calc non Af Amer: >90 mL/min (ref >90)
Glucose, Bld: 188 mg/dL — ABNORMAL HIGH (ref 70–99)
Potassium: 4 mEq/L (ref 3.7–5.3)
Sodium: 139 mEq/L (ref 137–147)

## 2013-11-09 LAB — CBC
HCT: 33.5 % — ABNORMAL LOW (ref 36.0–46.0)
Hemoglobin: 11.3 g/dL — ABNORMAL LOW (ref 12.0–15.0)
MCH: 30.8 pg (ref 26.0–34.0)
MCHC: 33.7 g/dL (ref 30.0–36.0)
MCV: 91.3 fL (ref 78.0–100.0)
Platelets: 263 10*3/uL (ref 150–400)
RBC: 3.67 MIL/uL — ABNORMAL LOW (ref 3.87–5.11)
RDW: 13.6 % (ref 11.5–15.5)
WBC: 14.6 10*3/uL — ABNORMAL HIGH (ref 4.0–10.5)

## 2013-11-09 MED ORDER — AZITHROMYCIN 250 MG PO TABS: ORAL_TABLET | ORAL | Status: DC

## 2013-11-09 MED ORDER — LEVOFLOXACIN 750 MG PO TABS: 750.0000 mg | ORAL_TABLET | Freq: Every day | ORAL | Status: DC

## 2013-11-09 NOTE — Discharge Summary (Signed)
Physician Discharge Summary  [name]:[id number] DOB: [birth year] DOA: 11/07/2013  PCP: Thayer Headings, MD  Admit date: 11/07/2013 Discharge date: 11/09/2013  Time spent: 40 minutes  Recommendations for Outpatient Follow-up:  1. Followup with primary care physician regularly for  Discharge Diagnoses:  Active Problems:   COPD exacerbation   CAP (community acquired pneumonia)   Discharge Condition: Stable  Diet recommendation: Regular diet  Filed Weights   11/07/13 1630 11/08/13 0124  Weight: 80.287 kg (177 lb) 70.6 kg (155 lb 10.3 oz)    History of present illness:  History of Present Illness:This is a 48 y.o. year old female with significant past medical history of COPD (non oxygen dependent), 1 PPD smoker presenting with CAP and COPD exacerbation. Pt reports worsening cough, dyspnea, wheezing, increased sputum production over the last week. Mild fevers and chills. Mild myalgias. Multiple sick contacts at home with similar sxs. Still smoking 1PPD. No CP.  Pt presented to ER with sxs. Received albuterol neb x 3 with minimal improvement in sxs. Had CXR that showed ? RLL infiltrate. Was initially satting in low 90s. Supplemental O2 placed. Was ambulated s/p neb txs w/o O2. Still symptomatic, though O2 sats improved. Did not receive IM solumedrol due to ? allerguy with cortisone in the past ( pt reports that it made her throat feel tight). Had ESI in spine within the past week with no reaction to steroids.  Pt started on rocephin and azithromycin for CAP. No recent hospitalizations.   Hospital Course:   1. Community acquired pneumonia: Patient given to the hospital with shortness of breath, productive cough and mild fever and chills. Initially this was thought to be flu-related symptoms but patient flu PCR is negative. Chest x-ray showed questionable infiltrates in the right lower lobe the patient treated as V. tach or pneumonia. Started on Rocephin and azithromycin at the time of  admission, patient discharged on 5 more days of Levaquin. Instructed to take OTC Mucinex. Patient is afebrile, does not have any cough or oxygen needs.  2. COPD: Does not seem to have COPD exacerbation as he does not have any wheezing right now, patient initially started on steroids, but it discontinued because the lack of wheezing.  3. History of ductal hyperplasia: Patient follows [facility], had history of atypical ductal hyperplasia and focal atypia, had history of left breast lumpectomy in 2010.  Procedures:  None  Consultations:  None  Discharge Exam: Filed Vitals:   11/09/13 0526  BP: 96/58  Pulse: 55  Temp: 97.8 F (36.6 C)  Resp: 18   General: Alert and awake, oriented x3, not in any acute distress. HEENT: anicteric sclera, pupils reactive to light and accommodation, EOMI CVS: S1-S2 clear, no murmur rubs or gallops Chest: clear to auscultation bilaterally, no wheezing, rales or rhonchi Abdomen: soft nontender, nondistended, normal bowel sounds, no organomegaly Extremities: no cyanosis, clubbing or edema noted bilaterally Neuro: Cranial nerves II-XII intact, no focal neurological deficits  Discharge Instructions      Discharge Orders   Future Appointments Provider Department Dept Phone   12/04/2013 10:00 AM [facility] [phone]   12/04/2013 10:15 AM [facility] [phone]   12/04/2013 10:30 AM Victorino December, MD  [facility] [phone]   Future Orders Complete By Expires   Increase activity slowly  As directed        Medication List  albuterol 108 (90 BASE) MCG/ACT inhaler  Commonly known as:  PROVENTIL HFA;VENTOLIN HFA  Inhale 2 puffs into the lungs every 6 (six) hours.     ALPRAZolam 1 MG tablet  Commonly known as:  XANAX  Take 1 mg by mouth 3 (three) times daily.     ALPRAZolam 1 MG 24 hr tablet  Commonly  known as:  XANAX XR  Take 1 mg by mouth 2 (two) times daily.     amphetamine-dextroamphetamine 15 MG 24 hr capsule  Commonly known as:  ADDERALL XR  Take 15 mg by mouth every morning.     guaiFENesin-codeine 100-10 MG/5ML syrup  Commonly known as:  ROBITUSSIN AC  Take 15 mLs by mouth daily as needed for cough or congestion.     levofloxacin 750 MG tablet  Commonly known as:  LEVAQUIN  Take 1 tablet (750 mg total) by mouth daily.     MUCINEX DM MAXIMUM STRENGTH PO  Take 20 mLs by mouth daily as needed (for cold).     oxyCODONE-acetaminophen 10-325 MG per tablet  Commonly known as:  PERCOCET  Take 1 tablet by mouth every 6 (six) hours as needed for pain.     pregabalin 75 MG capsule  Commonly known as:  LYRICA  Take 75 mg by mouth 3 (three) times daily.     promethazine 25 MG tablet  Commonly known as:  PHENERGAN  Take 25 mg by mouth every 6 (six) hours as needed for nausea or vomiting (migraine).     PROZAC PO  Take 80 mg by mouth at bedtime.     rizatriptan 10 MG tablet  Commonly known as:  MAXALT  Take 20 mg by mouth daily as needed for migraine. May repeat in 2 hours if needed     tiotropium 18 MCG inhalation capsule  Commonly known as:  SPIRIVA  Place 18 mcg into inhaler and inhale daily.     tiZANidine 4 MG tablet  Commonly known as:  ZANAFLEX  Take 4 mg by mouth every 6 (six) hours as needed for muscle spasms.       Allergies  Allergen Reactions  . Cortisone Acetate Shortness Of Breath and Other (See Comments)    Makes patient feel as if she is choking.  . Vicodin [Hydrocodone-Acetaminophen] Itching   Follow-up Information   Follow up with Thayer HeadingsMACKENZIE,BRIAN, MD In 1 week.   Specialty:  Internal Medicine   Contact information:   [address], [address] [place] [id number] [phone]        The results of significant diagnostics from this hospitalization (including imaging, microbiology, ancillary and laboratory) are listed below for  reference.    Significant Diagnostic Studies: Dg Chest 2 View (if Patient Has Fever And/or Copd)  11/07/2013   CLINICAL DATA:  Shortness of breath, history smoking, COPD, fibromyalgia  EXAM: CHEST  2 VIEW  COMPARISON:  06/21/2013  FINDINGS: Upper-normal size of cardiac silhouette.  Mediastinal contours and pulmonary vascularity normal.  Mild chronic bronchitic changes.  Increased markings at medial right lung base, cannot exclude early right lower lobe infiltrate.  Remaining lungs clear.  No pleural effusion or pneumothorax.  Bones unremarkable.  IMPRESSION: Bronchitic changes with questionable medial right lower lobe infiltrate.   Electronically Signed   By: Ulyses SouthwardMark  Boles M.D.   On: 11/07/2013 17:54   Mr Lumbar Spine Wo Contrast  10/16/2013   CLINICAL DATA:  Low back pain for 3 months. Old injury. No history of surgery or malignancy.  EXAM:  MRI LUMBAR SPINE WITHOUT CONTRAST  TECHNIQUE: Multiplanar, multisequence MR imaging was performed. No intravenous contrast was administered.  COMPARISON:  Radiographs 08/14/2013.  FINDINGS: There is a mild levoconvex curve with the apex at L4. Marrow signal is within normal limits. Vertebral body height preserved. Disc desiccation is present at L2-L3 through L4-L5. The spinal cord terminates posterior to L1-L2 interspace. The paraspinal soft tissues are within normal limits.  T11-T12 through L1-L2 discs appear normal.  L2-L3: There is a tiny left foraminal protrusion without resulting stenosis. Associated concentric annular tear in the left lateral region. This potentially irritates the left L2 nerve.  L3-L4:  Disc desiccation and shallow bulging without stenosis.  L4-L5:  Mild facet arthrosis.  No stenosis.  L5-S1:  Negative.  IMPRESSION: Mild lumbar spondylosis. There is no focal stenosis however there is a left lateral annular tear and shallow protrusion potentially irritating the left L2 nerve.   Electronically Signed   By: Andreas Newport M.D.   On: 10/16/2013 13:02     Microbiology: Recent Results (from the past 240 hour(s))  CULTURE, BLOOD (ROUTINE X 2)     Status: None   Collection Time    11/08/13  1:42 AM      Result Value Range Status   Specimen Description BLOOD LEFT ARM   Final   Special Requests BOTTLES DRAWN AEROBIC AND ANAEROBIC 10CC EACH   Final   Culture  Setup Time     Final   Value: 11/08/2013 09:23     Performed at [facility]   Culture     Final   Value:        BLOOD CULTURE RECEIVED NO GROWTH TO DATE CULTURE WILL BE HELD FOR 5 DAYS BEFORE ISSUING A FINAL NEGATIVE REPORT     Performed at [facility]   Report Status PENDING   Incomplete  CULTURE, BLOOD (ROUTINE X 2)     Status: None   Collection Time    11/08/13  1:50 AM      Result Value Range Status   Specimen Description BLOOD RIGHT HAND   Final   Special Requests BOTTLES DRAWN AEROBIC ONLY 10CC   Final   Culture  Setup Time     Final   Value: 11/08/2013 09:23     Performed at [facility]   Culture     Final   Value:        BLOOD CULTURE RECEIVED NO GROWTH TO DATE CULTURE WILL BE HELD FOR 5 DAYS BEFORE ISSUING A FINAL NEGATIVE REPORT     Performed at [facility]   Report Status PENDING   Incomplete  CULTURE, EXPECTORATED SPUTUM-ASSESSMENT     Status: None   Collection Time    11/08/13  6:40 AM      Result Value Range Status   Specimen Description SPUTUM   Final   Special Requests NONE   Final   Sputum evaluation     Final   Value: THIS SPECIMEN IS ACCEPTABLE. RESPIRATORY CULTURE REPORT TO FOLLOW.   Report Status 11/08/2013 FINAL   Final  CULTURE, RESPIRATORY (NON-EXPECTORATED)     Status: None   Collection Time    11/08/13  6:40 AM      Result Value Range Status   Specimen Description SPUTUM   Final   Special Requests NONE   Final   Gram Stain     Final   Value: MODERATE WBC PRESENT, PREDOMINANTLY PMN     FEW SQUAMOUS EPITHELIAL CELLS  PRESENT     RARE GRAM POSITIVE COCCI     IN PAIRS RARE YEAST     Performed at [facility]   Culture     Final   Value: Culture reincubated for better growth     Performed at [facility]   Report Status PENDING   Incomplete     Labs: Basic Metabolic Panel:  Recent Labs Lab 11/07/13 1639 11/08/13 0142 11/08/13 1115 11/09/13 0620  NA 141  --  140 139  K 3.9  --  4.2 4.0  CL 103  --  102 101  CO2 23  --  24 25  GLUCOSE 116*  --  188* 188*  BUN 6  --  10 10  CREATININE 0.60 0.57 0.49* 0.48*  CALCIUM 9.5  --  9.2 9.4   Liver Function Tests:  Recent Labs Lab 11/08/13 1115  AST 16  ALT 17  ALKPHOS 45  BILITOT <0.2*  PROT 7.0  ALBUMIN 3.1*   No results found for this basename: LIPASE, AMYLASE,  in the last 168 hours No results found for this basename: AMMONIA,  in the last 168 hours CBC:  Recent Labs Lab 11/07/13 1639 11/08/13 0142 11/08/13 1115 11/09/13 0620  WBC 9.4 7.1 6.8 14.6*  NEUTROABS  --   --  6.0  --   HGB 12.3 11.2* 12.0 11.3*  HCT 36.3 32.8* 35.4* 33.5*  MCV 90.8 91.4 91.5 91.3  PLT 254 240 241 263   Cardiac Enzymes: No results found for this basename: CKTOTAL, CKMB, CKMBINDEX, TROPONINI,  in the last 168 hours BNP: BNP (last 3 results)  Recent Labs  06/21/13 1406 11/07/13 1639  PROBNP 89.5 158.0*   CBG: No results found for this basename: GLUCAP,  in the last 168 hours     Signed:  Kathlyne Loud A  [facility] 11/09/2013, 10:48 AM

## 2013-11-09 NOTE — Progress Notes (Signed)
11/09/13 Patient discharged home,discharge instructions reviewed with patient. IV site removed and Telemetry d/cd.

## 2013-11-10 LAB — CULTURE, RESPIRATORY W GRAM STAIN

## 2013-11-11 NOTE — ED Provider Notes (Signed)
Medical screening examination/treatment/procedure(s) were performed by non-physician practitioner and as supervising physician I was immediately available for consultation/collaboration.  EKG Interpretation    Date/Time:  Wednesday November 07 2013 16:23:40 EST Ventricular Rate:  60 PR Interval:  118 QRS Duration: 84 QT Interval:  412 QTC Calculation: 412 R Axis:   80 Text Interpretation:  Normal sinus rhythm Normal ECG ED PHYSICIAN INTERPRETATION AVAILABLE IN [facility] Confirmed by TEST, RECORD ([id number]) on 11/09/2013 11:49:21 AM              Shelda JakesScott W. Kerensa Nicklas, MD 11/11/13 [phone]

## 2013-11-14 LAB — CULTURE, BLOOD (ROUTINE X 2)
Culture: NO GROWTH
Culture: NO GROWTH

## 2013-12-04 ENCOUNTER — Other Ambulatory Visit: Payer: Medicare Other

## 2013-12-04 ENCOUNTER — Ambulatory Visit: Payer: Medicare Other | Admitting: Oncology

## 2013-12-04 ENCOUNTER — Ambulatory Visit: Payer: Medicare Other

## 2013-12-04 ENCOUNTER — Other Ambulatory Visit: Payer: Self-pay | Admitting: *Deleted

## 2013-12-04 DIAGNOSIS — IMO0001 Reserved for inherently not codable concepts without codable children: Secondary | ICD-10-CM

## 2014-01-11 ENCOUNTER — Other Ambulatory Visit (INDEPENDENT_AMBULATORY_CARE_PROVIDER_SITE_OTHER): Payer: Medicare Other

## 2014-01-11 ENCOUNTER — Encounter: Payer: Self-pay | Admitting: Pulmonary Disease

## 2014-01-11 ENCOUNTER — Encounter (INDEPENDENT_AMBULATORY_CARE_PROVIDER_SITE_OTHER): Payer: Self-pay

## 2014-01-11 ENCOUNTER — Ambulatory Visit (INDEPENDENT_AMBULATORY_CARE_PROVIDER_SITE_OTHER): Payer: Medicare Other | Admitting: Pulmonary Disease

## 2014-01-11 VITALS — BP 110/68 | HR 70 | Ht 64.0 in | Wt 179.0 lb

## 2014-01-11 DIAGNOSIS — F17200 Nicotine dependence, unspecified, uncomplicated: Secondary | ICD-10-CM

## 2014-01-11 DIAGNOSIS — J449 Chronic obstructive pulmonary disease, unspecified: Secondary | ICD-10-CM

## 2014-01-11 DIAGNOSIS — R0609 Other forms of dyspnea: Secondary | ICD-10-CM

## 2014-01-11 DIAGNOSIS — Z720 Tobacco use: Secondary | ICD-10-CM

## 2014-01-11 DIAGNOSIS — R918 Other nonspecific abnormal finding of lung field: Secondary | ICD-10-CM

## 2014-01-11 DIAGNOSIS — R0683 Snoring: Secondary | ICD-10-CM

## 2014-01-11 DIAGNOSIS — R0989 Other specified symptoms and signs involving the circulatory and respiratory systems: Secondary | ICD-10-CM

## 2014-01-11 DIAGNOSIS — R0600 Dyspnea, unspecified: Secondary | ICD-10-CM

## 2014-01-11 LAB — CBC
HCT: 34.7 % — ABNORMAL LOW (ref 36.0–46.0)
Hemoglobin: 11.7 g/dL — ABNORMAL LOW (ref 12.0–15.0)
MCHC: 33.7 g/dL (ref 30.0–36.0)
MCV: 90.1 fl (ref 78.0–100.0)
Platelets: 257 10*3/uL (ref 150.0–400.0)
RBC: 3.85 Mil/uL — ABNORMAL LOW (ref 3.87–5.11)
RDW: 14 % (ref 11.5–14.6)
WBC: 10.4 10*3/uL (ref 4.5–10.5)

## 2014-01-11 LAB — COMPREHENSIVE METABOLIC PANEL
ALT: 15 U/L (ref 0–35)
AST: 14 U/L (ref 0–37)
Albumin: 4.1 g/dL (ref 3.5–5.2)
Alkaline Phosphatase: 27 U/L — ABNORMAL LOW (ref 39–117)
BUN: 11 mg/dL (ref 6–23)
CO2: 26 mEq/L (ref 19–32)
Calcium: 9.1 mg/dL (ref 8.4–10.5)
Chloride: 105 mEq/L (ref 96–112)
Creatinine, Ser: 0.9 mg/dL (ref 0.4–1.2)
GFR: 75.19 mL/min (ref >60.00)
Glucose, Bld: 106 mg/dL — ABNORMAL HIGH (ref 70–99)
Potassium: 3.5 mEq/L (ref 3.5–5.1)
Sodium: 138 mEq/L (ref 135–145)
Total Bilirubin: 0.4 mg/dL (ref 0.3–1.2)
Total Protein: 6.8 g/dL (ref 6.0–8.3)

## 2014-01-11 MED ORDER — BUDESONIDE-FORMOTEROL FUMARATE 160-4.5 MCG/ACT IN AERO: 2.0000 | INHALATION_SPRAY | Freq: Two times a day (BID) | RESPIRATORY_TRACT | Status: DC

## 2014-01-11 NOTE — Progress Notes (Signed)
Chief Complaint  Patient presents with  . Pulmonary Consult    referred by Dr. Thea Silversmith.    History of Present Illness: [name] is a 48 y.o. female smoker for evaluation of dyspnea.  She was told she had COPD/emphysema after having a breathing test about 3 years ago.  She is a smoker.  She started smoking at age 18, and smoked up to 2 packs per day.  She is now smoking about 1 pack per day.  She has a regular cough with thick green to white sputum.  She also gets episodes of wheezing.  She denies hemoptysis or recent fever.  She can walk about 100 feet before getting winded.  She sometimes feels short of breath at rest also.  She feels like she can't take a deep breath.  She was on antibiotics about a month ago for pneumonia, and was on prednisone then also.  She gets bronchitis frequently.  Chest xray from November 07, 2013 showed right lower lobe infiltrate.  She has trouble with her sleep.  She has history of fibromyalgia and mgraines.  She is currently disabled.  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  She is from [place].  She has two pet dogs, and two cats.  She denies exposure to tuberculosis.  Her father had COPD/emphysema and lung cancer at age 35.  [name]  has a past medical history of Abscess; COPD (chronic obstructive pulmonary disease); Emphysema of lung; Depression; Headache(784.0); Fibromyalgia; Ovarian cyst; and Endometriosis.  [name]  has past surgical history that includes Appendectomy (1993); Abdominal hysterectomy (1993); Cholecystectomy; Breast surgery (2008); and Examination under anesthesia (02/15/2012).  Prior to Admission medications   Medication Sig Start Date End Date Taking? Authorizing Provider  albuterol (PROVENTIL HFA;VENTOLIN HFA) 108 (90 BASE) MCG/ACT inhaler Inhale 2 puffs into the lungs every 6 (six) hours.    Yes Historical Provider, MD  ALPRAZolam Prudy Feeler) 1 MG tablet Take 1 mg by mouth 3 (three) times daily.     Yes Historical Provider, MD  amphetamine-dextroamphetamine (ADDERALL XR) 15 MG 24 hr capsule Take 15 mg by mouth every morning.   Yes Historical Provider, MD  Dextromethorphan-Guaifenesin (MUCINEX DM MAXIMUM STRENGTH PO) Take 20 mLs by mouth daily as needed (for cold).   Yes Historical Provider, MD  FLUoxetine HCl (PROZAC PO) Take 80 mg by mouth at bedtime.    Yes Historical Provider, MD  guaiFENesin-codeine (ROBITUSSIN AC) 100-10 MG/5ML syrup Take 15 mLs by mouth daily as needed for cough or congestion.   Yes Historical Provider, MD  oxyCODONE-acetaminophen (PERCOCET) 10-325 MG per tablet Take 1 tablet by mouth every 6 (six) hours as needed for pain.   Yes Historical Provider, MD  pregabalin (LYRICA) 75 MG capsule Take 75 mg by mouth 3 (three) times daily.   Yes Historical Provider, MD  promethazine (PHENERGAN) 25 MG tablet Take 25 mg by mouth every 6 (six) hours as needed for nausea or vomiting (migraine).   Yes Historical Provider, MD  rizatriptan (MAXALT) 10 MG tablet Take 20 mg by mouth daily as needed for migraine. May repeat in 2 hours if needed   Yes Historical Provider, MD  tiotropium (SPIRIVA) 18 MCG inhalation capsule Place 18 mcg into inhaler and inhale daily.   Yes Historical Provider, MD  tiZANidine (ZANAFLEX) 4 MG tablet Take 4 mg by mouth every 6 (six) hours as needed for muscle spasms.   Yes Historical Provider, MD    Allergies  Allergen Reactions  . Cortisone Acetate Shortness Of Breath and Other (See Comments)    Makes patient feel as if she is choking.  . Vicodin [Hydrocodone-Acetaminophen] Itching    Her family history includes Cancer in her father. There is no history of Other.  She  reports that she has been smoking Cigarettes.  She has a 30 pack-year smoking history. She has never used smokeless tobacco. She reports that she does not drink alcohol or use illicit drugs.  Review of Systems  Constitutional: Positive for appetite change. Negative for fever, chills,  diaphoresis, activity change, fatigue and unexpected weight change.  HENT: Negative for congestion, dental problem, ear discharge, ear pain, facial swelling, hearing loss, mouth sores, nosebleeds, postnasal drip, rhinorrhea, sinus pressure, sneezing, sore throat, tinnitus, trouble swallowing and voice change.   Eyes: Negative for photophobia, discharge, itching and visual disturbance.  Respiratory: Positive for cough. Negative for apnea, choking, chest tightness, shortness of breath, wheezing and stridor.   Cardiovascular: Positive for chest pain. Negative for palpitations and leg swelling.  Gastrointestinal: Positive for abdominal pain. Negative for nausea, vomiting, constipation, blood in stool and abdominal distention.  Genitourinary: Negative for dysuria, urgency, frequency, hematuria, flank pain, decreased urine volume and difficulty urinating.  Musculoskeletal: Negative for arthralgias, back pain, gait problem, joint swelling, myalgias, neck pain and neck stiffness.  Skin: Negative for color change, pallor and rash.  Neurological: Positive for headaches. Negative for dizziness, tremors, seizures, syncope, speech difficulty, weakness, light-headedness and numbness.  Hematological: Negative for adenopathy. Does not bruise/bleed easily.  Psychiatric/Behavioral: Positive for dysphoric mood. Negative for confusion, sleep disturbance and agitation. The patient is nervous/anxious.    Physical Exam:  General - No distress ENT - No sinus tenderness, no oral exudate, no LAN, no thyromegaly, TM clear, pupils equal/reactive Cardiac - s1s2 regular, no murmur, pulses symmetric Chest - No wheeze/rales/dullness, good air entry, normal respiratory excursion Back - No focal tenderness Abd - Soft, non-tender, no organomegaly, + bowel sounds Ext - No edema Neuro - Normal strength, cranial nerves intact Skin - No rashes Psych - Normal mood, and behavior   Dg Chest 2 View (if Patient Has Fever And/or  Copd)  11/07/2013   CLINICAL DATA:  Shortness of breath, history smoking, COPD, fibromyalgia  EXAM: CHEST  2 VIEW  COMPARISON:  06/21/2013  FINDINGS: Upper-normal size of cardiac silhouette.  Mediastinal contours and pulmonary vascularity normal.  Mild chronic bronchitic changes.  Increased markings at medial right lung base, cannot exclude early right lower lobe infiltrate.  Remaining lungs clear.  No pleural effusion or pneumothorax.  Bones unremarkable.  IMPRESSION: Bronchitic changes with questionable medial right lower lobe infiltrate.   Electronically Signed   By: Ulyses Southward M.D.   On: 11/07/2013 17:54    Lab Results  Component Value Date   WBC 14.6* 11/09/2013   HGB 11.3* 11/09/2013   HCT 33.5* 11/09/2013   MCV 91.3 11/09/2013   PLT 263 11/09/2013    Lab Results  Component Value Date   CREATININE 0.48* 11/09/2013   BUN 10 11/09/2013   NA 139 11/09/2013   K 4.0 11/09/2013   CL 101 11/09/2013   CO2 25 11/09/2013    Lab Results  Component Value Date   ALT 17 11/08/2013   AST 16 11/08/2013   ALKPHOS 45 11/08/2013   BILITOT <0.2* 11/08/2013    BNP    Component Value Date/Time   PROBNP 158.0* 11/07/2013 1639      Assessment/Plan:  Coralyn Helling, MD [facility]  [facility]/Sleep Pager:  [phone]

## 2014-01-11 NOTE — Patient Instructions (Signed)
Lab tests today Will schedule breathing test (PFT), CT chest, and sleep study Symbicort two puffs twice per day, and rinse mouth after each use Spiriva one puff daily Albuterol two puffs up to four times per day for cough, wheeze, or chest congestion Follow up in 2 to 3 weeks

## 2014-01-11 NOTE — Progress Notes (Deleted)
   Subjective:    Patient ID: [name], female    DOB: 1966-09-10, 48 y.o.   MRN: [id number]  HPI    Review of Systems  Constitutional: Positive for appetite change. Negative for fever, chills, diaphoresis, activity change, fatigue and unexpected weight change.  HENT: Negative for congestion, dental problem, ear discharge, ear pain, facial swelling, hearing loss, mouth sores, nosebleeds, postnasal drip, rhinorrhea, sinus pressure, sneezing, sore throat, tinnitus, trouble swallowing and voice change.   Eyes: Negative for photophobia, discharge, itching and visual disturbance.  Respiratory: Positive for cough. Negative for apnea, choking, chest tightness, shortness of breath, wheezing and stridor.   Cardiovascular: Positive for chest pain. Negative for palpitations and leg swelling.  Gastrointestinal: Positive for abdominal pain. Negative for nausea, vomiting, constipation, blood in stool and abdominal distention.  Genitourinary: Negative for dysuria, urgency, frequency, hematuria, flank pain, decreased urine volume and difficulty urinating.  Musculoskeletal: Negative for arthralgias, back pain, gait problem, joint swelling, myalgias, neck pain and neck stiffness.  Skin: Negative for color change, pallor and rash.  Neurological: Positive for headaches. Negative for dizziness, tremors, seizures, syncope, speech difficulty, weakness, light-headedness and numbness.  Hematological: Negative for adenopathy. Does not bruise/bleed easily.  Psychiatric/Behavioral: Positive for dysphoric mood. Negative for confusion, sleep disturbance and agitation. The patient is nervous/anxious.        Objective:   Physical Exam        Assessment & Plan:

## 2014-01-18 ENCOUNTER — Encounter (HOSPITAL_COMMUNITY): Payer: Medicare Other

## 2014-01-18 ENCOUNTER — Ambulatory Visit (INDEPENDENT_AMBULATORY_CARE_PROVIDER_SITE_OTHER)
Admission: RE | Admit: 2014-01-18 | Discharge: 2014-01-18 | Disposition: A | Discharge status: Home / Self Care | Payer: Medicare Other | Source: Ambulatory Visit | Attending: Pulmonary Disease | Admitting: Pulmonary Disease

## 2014-01-18 ENCOUNTER — Telehealth: Payer: Self-pay | Admitting: Pulmonary Disease

## 2014-01-18 DIAGNOSIS — J449 Chronic obstructive pulmonary disease, unspecified: Secondary | ICD-10-CM

## 2014-01-18 DIAGNOSIS — R918 Other nonspecific abnormal finding of lung field: Secondary | ICD-10-CM

## 2014-01-18 MED ORDER — IOHEXOL 300 MG/ML  SOLN
80.0000 mL | Freq: Once | INTRAMUSCULAR | Status: AC | PRN
  Administered 2014-01-18: 80 mL via INTRAVENOUS

## 2014-01-18 NOTE — Telephone Encounter (Signed)
CMP     Component Value Date/Time   NA 138 01/11/2014 1634   K 3.5 01/11/2014 1634   CL 105 01/11/2014 1634   CO2 26 01/11/2014 1634   GLUCOSE 106* 01/11/2014 1634   BUN 11 01/11/2014 1634   CREATININE 0.9 01/11/2014 1634   CALCIUM 9.1 01/11/2014 1634   PROT 6.8 01/11/2014 1634   ALBUMIN 4.1 01/11/2014 1634   AST 14 01/11/2014 1634   ALT 15 01/11/2014 1634   ALKPHOS 27* 01/11/2014 1634   BILITOT 0.4 01/11/2014 1634    CBC    Component Value Date/Time   WBC 10.4 01/11/2014 1634   RBC 3.85* 01/11/2014 1634   HGB 11.7* 01/11/2014 1634   HCT 34.7* 01/11/2014 1634   PLT 257.0 01/11/2014 1634   MCV 90.1 01/11/2014 1634   MCHC 33.7 01/11/2014 1634   RDW 14.0 01/11/2014 1634    Will have my nurse inform pt that labs are normal.  Will call back once her other tests are done.

## 2014-01-20 ENCOUNTER — Encounter: Payer: Self-pay | Admitting: Pulmonary Disease

## 2014-01-20 DIAGNOSIS — R0683 Snoring: Secondary | ICD-10-CM | POA: Insufficient documentation

## 2014-01-20 DIAGNOSIS — R0600 Dyspnea, unspecified: Secondary | ICD-10-CM | POA: Insufficient documentation

## 2014-01-20 DIAGNOSIS — F17210 Nicotine dependence, cigarettes, uncomplicated: Secondary | ICD-10-CM | POA: Insufficient documentation

## 2014-01-20 NOTE — Assessment & Plan Note (Signed)
She reports snoring, sleep disruption, witnessed apnea and daytime sleepiness.  I am concerned she could have sleep apnea.  To further assess will arrange for in lab sleep study.

## 2014-01-20 NOTE — Assessment & Plan Note (Signed)
She has persistent symptoms of dypsnea.  She has reported history of COPD and extensive history of smoking.  She could have obstructive lung disease, but she is relatively young to have this diagnosis.  She is also inactive, and likely has a component of deconditioning.  She may need further cardiac assessment at some point also.  To further assess will arrange for labs, PFT, and CT chest.

## 2014-01-20 NOTE — Assessment & Plan Note (Signed)
She reports history of COPD.  She is to continue spiriva, symbicort, and prn albuterol for now.  Will further assess for emphysema with PFT, and CT chest.  Will also check alpha 1 antitrypsin level.

## 2014-01-20 NOTE — Assessment & Plan Note (Signed)
She was treated for pneumonia in January 2015 and has persistent pulmonary infiltrates.  Will get chest CT to further assess.  Depending on results will determine if she needs lung tissue sampling.

## 2014-01-20 NOTE — Assessment & Plan Note (Signed)
Discussed with her the importance of smoking cessation.  Will need to address further at next visit.

## 2014-01-23 ENCOUNTER — Encounter (HOSPITAL_COMMUNITY): Payer: Medicare Other

## 2014-01-23 NOTE — Telephone Encounter (Signed)
lmomtcb x1 

## 2014-01-24 LAB — ALPHA-1 ANTITRYPSIN PHENOTYPE: A-1 Antitrypsin: 154 mg/dL (ref 83–199)

## 2014-01-24 NOTE — Telephone Encounter (Signed)
I spoke with patient about results and she verbalized understanding and had no questions 

## 2014-01-24 NOTE — Telephone Encounter (Signed)
Pt trying to reach triage nurse a/b results of test pls call @ [phone].Caren GriffinsStanley A Dalton

## 2014-01-24 NOTE — Telephone Encounter (Signed)
Returning call.

## 2014-01-25 ENCOUNTER — Ambulatory Visit (HOSPITAL_COMMUNITY)
Admission: RE | Admit: 2014-01-25 | Discharge: 2014-01-25 | Disposition: A | Discharge status: Home / Self Care | Payer: Medicare Other | Source: Ambulatory Visit | Attending: Pulmonary Disease | Admitting: Pulmonary Disease

## 2014-01-25 DIAGNOSIS — F17200 Nicotine dependence, unspecified, uncomplicated: Secondary | ICD-10-CM | POA: Insufficient documentation

## 2014-01-25 DIAGNOSIS — Z79899 Other long term (current) drug therapy: Secondary | ICD-10-CM | POA: Insufficient documentation

## 2014-01-25 DIAGNOSIS — J4489 Other specified chronic obstructive pulmonary disease: Secondary | ICD-10-CM | POA: Insufficient documentation

## 2014-01-25 DIAGNOSIS — R918 Other nonspecific abnormal finding of lung field: Secondary | ICD-10-CM | POA: Insufficient documentation

## 2014-01-25 DIAGNOSIS — J449 Chronic obstructive pulmonary disease, unspecified: Secondary | ICD-10-CM | POA: Insufficient documentation

## 2014-01-25 MED ORDER — ALBUTEROL SULFATE (2.5 MG/3ML) 0.083% IN NEBU
2.5000 mg | INHALATION_SOLUTION | Freq: Once | RESPIRATORY_TRACT | Status: AC
  Administered 2014-01-25: 2.5 mg via RESPIRATORY_TRACT

## 2014-01-30 ENCOUNTER — Encounter: Payer: Self-pay | Admitting: Oncology

## 2014-02-08 ENCOUNTER — Ambulatory Visit (INDEPENDENT_AMBULATORY_CARE_PROVIDER_SITE_OTHER)
Admission: RE | Admit: 2014-02-08 | Discharge: 2014-02-08 | Disposition: A | Discharge status: Home / Self Care | Payer: Medicare Other | Source: Ambulatory Visit | Attending: Pulmonary Disease | Admitting: Pulmonary Disease

## 2014-02-08 ENCOUNTER — Ambulatory Visit (INDEPENDENT_AMBULATORY_CARE_PROVIDER_SITE_OTHER): Payer: Medicare Other | Admitting: Pulmonary Disease

## 2014-02-08 ENCOUNTER — Encounter: Payer: Self-pay | Admitting: Pulmonary Disease

## 2014-02-08 ENCOUNTER — Other Ambulatory Visit (INDEPENDENT_AMBULATORY_CARE_PROVIDER_SITE_OTHER): Payer: Medicare Other

## 2014-02-08 ENCOUNTER — Ambulatory Visit (HOSPITAL_BASED_OUTPATIENT_CLINIC_OR_DEPARTMENT_OTHER): Payer: Medicare Other | Attending: Pulmonary Disease

## 2014-02-08 VITALS — BP 102/60 | HR 64 | Ht 62.0 in | Wt 181.0 lb

## 2014-02-08 DIAGNOSIS — R0989 Other specified symptoms and signs involving the circulatory and respiratory systems: Secondary | ICD-10-CM

## 2014-02-08 DIAGNOSIS — F17200 Nicotine dependence, unspecified, uncomplicated: Secondary | ICD-10-CM

## 2014-02-08 DIAGNOSIS — G4733 Obstructive sleep apnea (adult) (pediatric): Secondary | ICD-10-CM | POA: Insufficient documentation

## 2014-02-08 DIAGNOSIS — R0600 Dyspnea, unspecified: Secondary | ICD-10-CM

## 2014-02-08 DIAGNOSIS — R0609 Other forms of dyspnea: Secondary | ICD-10-CM

## 2014-02-08 DIAGNOSIS — R0683 Snoring: Secondary | ICD-10-CM

## 2014-02-08 DIAGNOSIS — Z720 Tobacco use: Secondary | ICD-10-CM

## 2014-02-08 DIAGNOSIS — J449 Chronic obstructive pulmonary disease, unspecified: Secondary | ICD-10-CM

## 2014-02-08 DIAGNOSIS — J441 Chronic obstructive pulmonary disease with (acute) exacerbation: Secondary | ICD-10-CM

## 2014-02-08 DIAGNOSIS — R918 Other nonspecific abnormal finding of lung field: Secondary | ICD-10-CM

## 2014-02-08 LAB — SEDIMENTATION RATE: Sed Rate: 19 mm/hr (ref 0–22)

## 2014-02-08 NOTE — Patient Instructions (Signed)
-  Chest xray and lab tests today -Bronchoscopy schedule for Friday, April 17 at 8:30 AM.  [facility].  Go to outpatient admitting 1 hour before your scheduled test time.  Do not eat or drink anything after midnight before your test -Follow up in 2 weeks

## 2014-02-08 NOTE — Progress Notes (Signed)
Chief Complaint  Patient presents with  . COPD    Breathing is unchanged. Reports SOB, chest tightness, coughing with production of green mucus. Denies wheezing.    History of Present Illness: [name] is a 48 y.o. female smoker with dyspnea.  She is here to review her recent test results.  She is scheduled for sleep study later this week.  She continues to have trouble with her breathing.  She feels tight in her chest.  She has a cough, but not much sputum.  She does not feel like the inhalers help her breathing.  She continues to smoke.   TESTS: A1AT 01/11/14 >> 154, MM CT chest 01/18/14 >> mosaic GGO b/l Rt > Lt PFT 01/25/14 >> FEV1 2.11 (73%), FEV1% 86, TLC 4.40 (87%), DLCO 70%, no BD   [name]  has a past medical history of Abscess; COPD (chronic obstructive pulmonary disease); Emphysema of lung; Depression; Headache(784.0); Fibromyalgia; Ovarian cyst; and Endometriosis.  [name]  has past surgical history that includes Appendectomy (1993); Abdominal hysterectomy (1993); Cholecystectomy; Breast surgery (2008); and Examination under anesthesia (02/15/2012).  Prior to Admission medications   Medication Sig Start Date End Date Taking? Authorizing Provider  albuterol (PROVENTIL HFA;VENTOLIN HFA) 108 (90 BASE) MCG/ACT inhaler Inhale 2 puffs into the lungs every 6 (six) hours.    Yes Historical Provider, MD  ALPRAZolam Prudy Feeler(XANAX) 1 MG tablet Take 1 mg by mouth 3 (three) times daily.    Yes Historical Provider, MD  amphetamine-dextroamphetamine (ADDERALL XR) 15 MG 24 hr capsule Take 15 mg by mouth every morning.   Yes Historical Provider, MD  budesonide-formoterol (SYMBICORT) 160-4.5 MCG/ACT inhaler Inhale 2 puffs into the lungs 2 (two) times daily. 01/11/14  Yes Coralyn HellingVineet Keyri Salberg, MD  Dextromethorphan-Guaifenesin (MUCINEX DM MAXIMUM STRENGTH PO) Take 20 mLs by mouth daily as needed (for cold).   Yes Historical Provider, MD  FLUoxetine HCl (PROZAC PO) Take 80 mg by mouth  at bedtime.    Yes Historical Provider, MD  guaiFENesin-codeine (ROBITUSSIN AC) 100-10 MG/5ML syrup Take 15 mLs by mouth daily as needed for cough or congestion.   Yes Historical Provider, MD  oxyCODONE-acetaminophen (PERCOCET) 10-325 MG per tablet Take 1 tablet by mouth every 6 (six) hours as needed for pain.   Yes Historical Provider, MD  pregabalin (LYRICA) 75 MG capsule Take 75 mg by mouth 3 (three) times daily.   Yes Historical Provider, MD  promethazine (PHENERGAN) 25 MG tablet Take 25 mg by mouth every 6 (six) hours as needed for nausea or vomiting (migraine).   Yes Historical Provider, MD  rizatriptan (MAXALT) 10 MG tablet Take 20 mg by mouth daily as needed for migraine. May repeat in 2 hours if needed   Yes Historical Provider, MD  tiotropium (SPIRIVA) 18 MCG inhalation capsule Place 18 mcg into inhaler and inhale daily.   Yes Historical Provider, MD  tiZANidine (ZANAFLEX) 4 MG tablet Take 4 mg by mouth every 6 (six) hours as needed for muscle spasms.   Yes Historical Provider, MD    Allergies  Allergen Reactions  . Cortisone Acetate Shortness Of Breath and Other (See Comments)    Makes patient feel as if she is choking.  . Vicodin [Hydrocodone-Acetaminophen] Itching     Physical Exam:  General - No distress ENT - No sinus tenderness, no oral exudate, no LAN Cardiac - s1s2 regular, no murmur Chest - No wheeze/rales/dullness Back - No focal tenderness Abd - Soft, non-tender Ext - No edema Neuro -  Normal strength Skin - No rashes Psych - normal mood, and behavior  CMP     Component Value Date/Time   NA 138 01/11/2014 1634   K 3.5 01/11/2014 1634   CL 105 01/11/2014 1634   CO2 26 01/11/2014 1634   GLUCOSE 106* 01/11/2014 1634   BUN 11 01/11/2014 1634   CREATININE 0.9 01/11/2014 1634   CALCIUM 9.1 01/11/2014 1634   PROT 6.8 01/11/2014 1634   ALBUMIN 4.1 01/11/2014 1634   AST 14 01/11/2014 1634   ALT 15 01/11/2014 1634   ALKPHOS 27* 01/11/2014 1634   BILITOT 0.4 01/11/2014 1634     CBC    Component Value Date/Time   WBC 10.4 01/11/2014 1634   RBC 3.85* 01/11/2014 1634   HGB 11.7* 01/11/2014 1634   HCT 34.7* 01/11/2014 1634   PLT 257.0 01/11/2014 1634   MCV 90.1 01/11/2014 1634   MCHC 33.7 01/11/2014 1634   RDW 14.0 01/11/2014 1634    01/18/2014    CLINICAL DATA:  Persistent lung infiltrate. Worsening shortness of breath.   EXAM: CT CHEST WITH CONTRAST   TECHNIQUE: Multidetector CT imaging of the chest was performed during intravenous contrast administration.   CONTRAST:  80mL OMNIPAQUE IOHEXOL 300 MG/ML  SOLN   COMPARISON:  Multiple prior chest x-rays.   FINDINGS:  The chest wall is unremarkable. Breast masses, supraclavicular or axillary adenopathy. Small scattered lymph nodes are noted. The thyroid gland appears normal. The bony thorax is intact. No destructive bone lesions or spinal canal compromise. There is a benign-appearing sclerotic lesion in the T7 vertebral body.  The heart is normal in size. No pericardial effusion. No mediastinal or hilar mass. Scattered mediastinal and hilar lymph nodes are noted. No overt adenopathy. The aorta is normal in caliber. No dissection. The esophagus is grossly normal.  Examination of the lung parenchyma demonstrates a mosaic pattern of ground-glass attenuation in both lungs, right greater than left. These findings could be due to reactive airways disease/ asthma, respiratory bronchiolitis, asymmetric pulmonary edema, alveolitis, cryptogenic organizing pneumonia and hypersensitivity pneumonitis. No bronchiectasis. No interstitial lung disease. The tracheobronchial tree is unremarkable. No worrisome pulmonary nodules or masses.  The upper abdomen is unremarkable.   IMPRESSION:  1. Mosaic lung attenuation pattern with patchy bilateral ground-glass opacities. This could be due to reactive airways disease/asthma, respiratory bronchiolitis, partial airspace filling process such as alveolitis, edema or cryptogenic organizing pneumonia.  Hypersensitivity pneumonitis would be another possibility.  2. No focal airspace consolidation or worrisome pulmonary lesions. 3. Probable hyperplastic/inflammatory mediastinal and hilar lymph nodes.    Electronically Signed   By: Loralie Champagne M.D.   On: 01/18/2014 15:55     Assessment/Plan:  Coralyn Helling, MD [facility]/Critical [facility]/Sleep Pager:  [phone]

## 2014-02-09 LAB — RHEUMATOID FACTOR: Rhuematoid fact SerPl-aCnc: <10 IU/mL (ref <=14)

## 2014-02-09 LAB — ANA W/REFLEX IF POSITIVE: Anti Nuclear Antibody(ANA): NEGATIVE

## 2014-02-10 ENCOUNTER — Encounter: Payer: Self-pay | Admitting: Pulmonary Disease

## 2014-02-10 NOTE — Assessment & Plan Note (Addendum)
She has persistent dyspnea and extensive history of smoking.  She carries a diagnosis of COPD, but her recent PFT is not consistent with this, and she has not noticed beneficial effect from inhaler therapy.  It seems less likely that COPD is the cause of her dyspnea.  She does have b/l ground glass infiltrates on CT chest.  Differential for this is broad.  I am concerned mostly about inflammatory process.  Infectious process or malignancy seem much less likely.  I explained how cigarette smoking could contribute to development of these findings (particularly if she is found to have respiratory bronchiolitis).  She can continue inhaler therapy for now, but likely will plan to d/c maintenance inhaler therapy at next visit.

## 2014-02-10 NOTE — Assessment & Plan Note (Addendum)
She has persistent pulmonary infiltrates as detail on CT chest results.  She will need to have lung tissue sampling to further assess.  Will arrange for bronchoscopy, and this has been scheduled for Friday, April 17 at 8:30 AM.  This [facility] be done at [facility].  I reviewed the procedure in detail with her and her family.  Risks were detailed as bleeding, infection, pneumothorax, and non diagnosis.  Plan will be to do inspection bronchoscopy, BAL, and transbronchial biopsy.  Explained that depending on results she may need to have further lung tissue sampling with thoracic surgery versus possible additional trial of prednisone for more prolonged course.  Again emphasized the absolute need to stop smoking.  Will also check serology and hypersensitivity pneumonitis panel.

## 2014-02-10 NOTE — Assessment & Plan Note (Signed)
Will review her sleep study results when available.

## 2014-02-10 NOTE — Assessment & Plan Note (Signed)
Discussed with her the importance of smoking cessation.  Will continue to discussion options for smoking cessation.

## 2014-02-13 ENCOUNTER — Encounter: Payer: Self-pay | Admitting: Pulmonary Disease

## 2014-02-13 ENCOUNTER — Telehealth: Payer: Self-pay | Admitting: Pulmonary Disease

## 2014-02-13 DIAGNOSIS — G4710 Hypersomnia, unspecified: Secondary | ICD-10-CM

## 2014-02-13 DIAGNOSIS — G4730 Sleep apnea, unspecified: Secondary | ICD-10-CM

## 2014-02-13 NOTE — Telephone Encounter (Signed)
lmtcb x1 

## 2014-02-13 NOTE — Sleep Study (Signed)
[facility]  NAME: [name] DATE OF BIRTH:  November 21, 1965 MEDICAL RECORD NUMBER [id number]  LOCATION: [facility]  PHYSICIAN: Coralyn HellingVineet Claritza July, M.D. DATE OF STUDY: 02/08/2014  SLEEP STUDY TYPE: Polysomnogram.               REFERRING PHYSICIAN: Coralyn HellingSood, Kalum Minner, MD  INDICATION FOR STUDY:  [name] is a 48 y.o. female who presents to the sleep lab for evaluation of hypersomnia with obstructive sleep apnea.  She reports snoring, sleep disruption, apnea, and daytime sleepiness.  EPWORTH SLEEPINESS SCORE: 10. HEIGHT: 5\' 2"   WEIGHT:  180 lbs NECK SIZE: 14 in.  MEDICATIONS:  Current Outpatient Prescriptions on File Prior to Visit  Medication Sig Dispense Refill  . albuterol (PROVENTIL HFA;VENTOLIN HFA) 108 (90 BASE) MCG/ACT inhaler Inhale 2 puffs into the lungs every 6 (six) hours.       . ALPRAZolam (XANAX) 1 MG tablet Take 1 mg by mouth 3 (three) times daily.       Marland Kitchen. amphetamine-dextroamphetamine (ADDERALL XR) 15 MG 24 hr capsule Take 15 mg by mouth every morning.      . budesonide-formoterol (SYMBICORT) 160-4.5 MCG/ACT inhaler Inhale 2 puffs into the lungs 2 (two) times daily.  1 Inhaler  6  . Dextromethorphan-Guaifenesin (MUCINEX DM MAXIMUM STRENGTH PO) Take 20 mLs by mouth daily as needed (for cold).      Marland Kitchen. FLUoxetine HCl (PROZAC PO) Take 80 mg by mouth at bedtime.       Marland Kitchen. guaiFENesin-codeine (ROBITUSSIN AC) 100-10 MG/5ML syrup Take 15 mLs by mouth daily as needed for cough or congestion.      Marland Kitchen. oxyCODONE-acetaminophen (PERCOCET) 10-325 MG per tablet Take 1 tablet by mouth every 6 (six) hours as needed for pain.      . pregabalin (LYRICA) 75 MG capsule Take 75 mg by mouth 3 (three) times daily.      . promethazine (PHENERGAN) 25 MG tablet Take 25 mg by mouth every 6 (six) hours as needed for nausea or vomiting (migraine).      . rizatriptan (MAXALT) 10 MG tablet Take 20 mg by mouth daily as needed for migraine. May repeat in 2 hours if  needed      . tiotropium (SPIRIVA) 18 MCG inhalation capsule Place 18 mcg into inhaler and inhale daily.      Marland Kitchen. tiZANidine (ZANAFLEX) 4 MG tablet Take 4 mg by mouth every 6 (six) hours as needed for muscle spasms.       No current facility-administered medications on file prior to visit.    SLEEP ARCHITECTURE:  Total recording time: 382 minutes.  Total sleep time was: 340 minutes.  Sleep efficiency: 89%.  Sleep latency: 0 minutes.  REM latency: 341.5 minutes.  Stage N1: 7.2%.  Stage N2: 82.9%.  Stage N3: 0%.  Stage R:  9.9%.  Supine sleep: 74 minutes.  Non-supine sleep: 266 minutes.  CARDIAC DATA:  Average heart rate: 62 beats per minute. Rhythm strip: sinus rhythm with occasional PVC's.  RESPIRATORY DATA: Average respiratory rate: 19. Snoring: moderate. Average AHI: 0.4.   Apnea index: 0.  Hypopnea index: 0.4. Obstructive apnea index: 0.  Central apnea index: 0.  Mixed apnea index: 0. REM AHI: 1.8.  NREM AHI: 0.2. Supine AHI: 0. Non-supine AHI: 0.4.  MOVEMENT/PARASOMNIA:  Periodic limb movement: 0.7.  Period limb movements with arousals: 0. Restroom trips: one.  OXYGEN DATA:  Baseline oxygenation: 94%. Lowest SaO2: 90%. Time spent below SaO2 90%: 0 minutes. Supplemental  oxygen used: none.  IMPRESSION/ RECOMMENDATION:   This study did not show significant sleep apnea or periodic limb movements.  She had very short time to sleep onset.  This is suggestive of excessive sleepiness.  Clinical correlation is needed to determine the significance of this.  Complete review of her sleep pattern and sleep hygiene should be done.  She may need additional sleep testing (MSLT and/or MWT) to further assess this.   Additional therapies include weight loss, CPAP, oral appliance, or surgical evaluation.   Coralyn HellingVineet Braedyn Kauk, M.D. Diplomate, Biomedical engineerAmerican Board of Sleep Medicine  ELECTRONICALLY SIGNED ON:  02/13/2014, 8:09 AM [facility] PH: [phone]   FX: [phone]  [phone] ACCREDITED BY THE AMERICAN ACADEMY OF SLEEP MEDICINE

## 2014-02-13 NOTE — Telephone Encounter (Signed)
PSG 02/08/14 >> AHI 0.4, SaO2 low 90%, PLMI 0.7, sleep onset 0 minutes.  Will have my nurse inform pt that sleep study did not show significant sleep apnea.  Will discuss in more detail at next visit.  No change to current treatment plan.

## 2014-02-15 ENCOUNTER — Encounter (INDEPENDENT_AMBULATORY_CARE_PROVIDER_SITE_OTHER): Payer: Self-pay

## 2014-02-15 ENCOUNTER — Ambulatory Visit (HOSPITAL_COMMUNITY)
Admission: RE | Admit: 2014-02-15 | Discharge: 2014-02-15 | Disposition: A | Discharge status: Home / Self Care | Payer: Medicare Other | Source: Ambulatory Visit | Attending: Pulmonary Disease | Admitting: Pulmonary Disease

## 2014-02-15 ENCOUNTER — Encounter (HOSPITAL_COMMUNITY)
Admission: RE | Disposition: A | Discharge status: Home / Self Care | Payer: Medicare Other | Source: Ambulatory Visit | Attending: Pulmonary Disease

## 2014-02-15 ENCOUNTER — Encounter (HOSPITAL_COMMUNITY): Payer: Self-pay | Admitting: Respiratory Therapy

## 2014-02-15 ENCOUNTER — Other Ambulatory Visit: Payer: Self-pay | Admitting: Pulmonary Disease

## 2014-02-15 ENCOUNTER — Ambulatory Visit (HOSPITAL_COMMUNITY): Payer: Medicare Other

## 2014-02-15 DIAGNOSIS — Z79899 Other long term (current) drug therapy: Secondary | ICD-10-CM | POA: Insufficient documentation

## 2014-02-15 DIAGNOSIS — R0989 Other specified symptoms and signs involving the circulatory and respiratory systems: Secondary | ICD-10-CM

## 2014-02-15 DIAGNOSIS — Z720 Tobacco use: Secondary | ICD-10-CM

## 2014-02-15 DIAGNOSIS — R0600 Dyspnea, unspecified: Secondary | ICD-10-CM

## 2014-02-15 DIAGNOSIS — R05 Cough: Secondary | ICD-10-CM | POA: Insufficient documentation

## 2014-02-15 DIAGNOSIS — Z90710 Acquired absence of both cervix and uterus: Secondary | ICD-10-CM | POA: Insufficient documentation

## 2014-02-15 DIAGNOSIS — R0609 Other forms of dyspnea: Secondary | ICD-10-CM | POA: Insufficient documentation

## 2014-02-15 DIAGNOSIS — R918 Other nonspecific abnormal finding of lung field: Secondary | ICD-10-CM | POA: Insufficient documentation

## 2014-02-15 DIAGNOSIS — R059 Cough, unspecified: Secondary | ICD-10-CM | POA: Insufficient documentation

## 2014-02-15 DIAGNOSIS — F17200 Nicotine dependence, unspecified, uncomplicated: Secondary | ICD-10-CM

## 2014-02-15 DIAGNOSIS — Z801 Family history of malignant neoplasm of trachea, bronchus and lung: Secondary | ICD-10-CM | POA: Insufficient documentation

## 2014-02-15 DIAGNOSIS — IMO0001 Reserved for inherently not codable concepts without codable children: Secondary | ICD-10-CM | POA: Insufficient documentation

## 2014-02-15 DIAGNOSIS — Z9089 Acquired absence of other organs: Secondary | ICD-10-CM | POA: Insufficient documentation

## 2014-02-15 DIAGNOSIS — J438 Other emphysema: Secondary | ICD-10-CM | POA: Insufficient documentation

## 2014-02-15 HISTORY — PX: VIDEO BRONCHOSCOPY: SHX5072

## 2014-02-15 LAB — BODY FLUID CELL COUNT WITH DIFFERENTIAL
Lymphs, Fluid: 3 %
Monocyte-Macrophage-Serous Fluid: 81 % (ref 50–90)
Neutrophil Count, Fluid: 16 % (ref 0–25)
Total Nucleated Cell Count, Fluid: 23 cu mm (ref 0–1000)

## 2014-02-15 SURGERY — BRONCHOSCOPY, WITH FLUOROSCOPY
Anesthesia: Moderate Sedation | Laterality: Bilateral

## 2014-02-15 MED ORDER — SODIUM CHLORIDE 0.9 % IV SOLN
INTRAVENOUS | Status: DC
  Administered 2014-02-15: 09:00:00 via INTRAVENOUS

## 2014-02-15 MED ORDER — FENTANYL CITRATE 0.05 MG/ML IJ SOLN
INTRAMUSCULAR | Status: AC
  Filled 2014-02-15: qty 4

## 2014-02-15 MED ORDER — MIDAZOLAM HCL 10 MG/2ML IJ SOLN
INTRAMUSCULAR | Status: AC
  Filled 2014-02-15: qty 4

## 2014-02-15 MED ORDER — FENTANYL CITRATE 0.05 MG/ML IJ SOLN
INTRAMUSCULAR | Status: DC | PRN
  Administered 2014-02-15 (×2): 50 ug via INTRAVENOUS

## 2014-02-15 MED ORDER — LIDOCAINE HCL 1 % IJ SOLN
INTRAMUSCULAR | Status: DC | PRN
  Administered 2014-02-15: 6 mL via RESPIRATORY_TRACT

## 2014-02-15 MED ORDER — MIDAZOLAM HCL 10 MG/2ML IJ SOLN
INTRAMUSCULAR | Status: DC | PRN
  Administered 2014-02-15 (×4): 2 mg via INTRAVENOUS

## 2014-02-15 MED ORDER — BUTAMBEN-TETRACAINE-BENZOCAINE 2-2-14 % EX AERO: 1.0000 | INHALATION_SPRAY | Freq: Once | CUTANEOUS | Status: DC

## 2014-02-15 MED ORDER — PHENYLEPHRINE HCL 0.25 % NA SOLN
NASAL | Status: DC | PRN
  Administered 2014-02-15: 1 via NASAL

## 2014-02-15 MED ORDER — PHENYLEPHRINE HCL 0.25 % NA SOLN
1.0000 | Freq: Four times a day (QID) | NASAL | Status: DC | PRN
Start: 2014-02-15 — End: 2014-02-15

## 2014-02-15 MED ORDER — LIDOCAINE HCL 2 % EX GEL: Freq: Once | CUTANEOUS | Status: DC

## 2014-02-15 MED ORDER — FLUCONAZOLE 100 MG PO TABS: 100.0000 mg | ORAL_TABLET | Freq: Every day | ORAL | Status: DC

## 2014-02-15 NOTE — Discharge Instructions (Signed)
Flexible Bronchoscopy, Care After These instructions give you information on caring for yourself after your procedure. Your doctor may also give you more specific instructions. Call your doctor if you have any problems or questions after your procedure. HOME CARE  Do not eat or drink anything for 2 hours after your procedure. If you try to eat or drink before the medicine wears off, food or drink could go into your lungs. You could also burn yourself.  After 2 hours have passed and when you can cough and gag normally, you may eat soft food and drink liquids slowly.  The day after the test, you may eat your normal diet.  You may do your normal activities.  Keep all doctor visits. GET HELP RIGHT AWAY IF:  You get more and more short of breath.  You get lightheaded.  You feel like you are going to pass out (faint).  You have chest pain.  You have new problems that worry you.  You cough up more than a little blood.  You cough up more blood than before. MAKE SURE YOU:  Understand these instructions.  Will watch your condition.  Will get help right away if you are not doing well or get worse. Document Released: 08/15/2009 Document Revised: 08/08/2013 Document Reviewed: 06/22/2013 [facility] Patient Information 2014 [employer] [place].  Nothing to eat or drink until   11:00 am Today   02/15/2014

## 2014-02-15 NOTE — Telephone Encounter (Signed)
I spoke with patient about results and she verbalized understanding and had no questions 

## 2014-02-15 NOTE — Progress Notes (Deleted)
CC: Dyspnea  History of Present Illness: Julie CancerConnie R Price is a 48 y.o. female smoker with dyspnea.  She was told she had COPD/emphysema after having a breathing test about 3 years ago. She is a smoker. She started smoking at age 48, and smoked up to 2 packs per day. She is now smoking about 1 pack per day.   She has a regular cough with thick green to white sputum. She also gets episodes of wheezing. She denies hemoptysis or recent fever. She can walk about 100 feet before getting winded. She sometimes feels short of breath at rest also. She feels like she can't take a deep breath.   She was on antibiotics about a month ago for pneumonia, and was on prednisone then also. She gets bronchitis frequently. Chest xray from November 07, 2013 showed right lower lobe infiltrate.   She has trouble with her sleep. She has history of fibromyalgia and mgraines.   She is currently disabled. 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 She has two pet dogs, and two cats. She denies exposure to tuberculosis.   Her father had COPD/emphysema and lung cancer at age 47.  She continues to have trouble with her breathing.  She feels tight in her chest.  She has a cough, but not much sputum.  She does not feel like the inhalers help her breathing.  She continues to smoke.   TESTS: A1AT 01/11/14 >> 154, MM CT chest 01/18/14 >> mosaic GGO b/l Rt > Lt PFT 01/25/14 >> FEV1 2.11 (73%), FEV1% 86, TLC 4.40 (87%), DLCO 70%, no BD   [name]  has a past medical history of Abscess; Depression; Headache(784.0); Fibromyalgia; Ovarian cyst; and Endometriosis.  [name]  has past surgical history that includes Appendectomy (1993); Abdominal hysterectomy (1993); Cholecystectomy; Breast surgery (2008); and Examination under anesthesia (02/15/2012).  Prior to Admission medications   Medication Sig Start Date End Date Taking? Authorizing Provider  albuterol (PROVENTIL HFA;VENTOLIN  HFA) 108 (90 BASE) MCG/ACT inhaler Inhale 2 puffs into the lungs every 6 (six) hours.    Yes Historical Provider, MD  ALPRAZolam Prudy Feeler(XANAX) 1 MG tablet Take 1 mg by mouth 3 (three) times daily.    Yes Historical Provider, MD  amphetamine-dextroamphetamine (ADDERALL XR) 15 MG 24 hr capsule Take 15 mg by mouth every morning.   Yes Historical Provider, MD  budesonide-formoterol (SYMBICORT) 160-4.5 MCG/ACT inhaler Inhale 2 puffs into the lungs 2 (two) times daily. 01/11/14  Yes Coralyn HellingVineet Darell Saputo, MD  Dextromethorphan-Guaifenesin (MUCINEX DM MAXIMUM STRENGTH PO) Take 20 mLs by mouth daily as needed (for cold).   Yes Historical Provider, MD  FLUoxetine HCl (PROZAC PO) Take 80 mg by mouth at bedtime.    Yes Historical Provider, MD  guaiFENesin-codeine (ROBITUSSIN AC) 100-10 MG/5ML syrup Take 15 mLs by mouth daily as needed for cough or congestion.   Yes Historical Provider, MD  oxyCODONE-acetaminophen (PERCOCET) 10-325 MG per tablet Take 1 tablet by mouth every 6 (six) hours as needed for pain.   Yes Historical Provider, MD  pregabalin (LYRICA) 75 MG capsule Take 75 mg by mouth 3 (three) times daily.   Yes Historical Provider, MD  promethazine (PHENERGAN) 25 MG tablet Take 25 mg by mouth every 6 (six) hours as needed for nausea or vomiting (migraine).   Yes Historical Provider, MD  rizatriptan (MAXALT) 10 MG tablet Take 20 mg by mouth daily as needed for migraine. May repeat in 2 hours  if needed   Yes Historical Provider, MD  tiotropium (SPIRIVA) 18 MCG inhalation capsule Place 18 mcg into inhaler and inhale daily.   Yes Historical Provider, MD  tiZANidine (ZANAFLEX) 4 MG tablet Take 4 mg by mouth every 6 (six) hours as needed for muscle spasms.   Yes Historical Provider, MD    Allergies  Allergen Reactions  . Cortisone Acetate Shortness Of Breath and Other (See Comments)    Makes patient feel as if she is choking.  . Vicodin [Hydrocodone-Acetaminophen] Itching   family history includes Cancer in her father.  There is no history of Other.  History   Social History  . Marital Status: Married    Spouse Name: N/A    Number of Children: N/A  . Years of Education: N/A   Occupational History  . Not on file.   Social History Main Topics  . Smoking status: Current Every Day Smoker -- 1.00 packs/day for 30 years    Types: Cigarettes  . Smokeless tobacco: Never Used  . Alcohol Use: No  . Drug Use: No  . Sexual Activity: Yes    Birth Control/ Protection: Surgical   Other Topics Concern  . Not on file   Social History Narrative  . No narrative on file   ROS: Negative except above  Physical Exam:  General - No distress ENT - No sinus tenderness, no oral exudate, no LAN Cardiac - s1s2 regular, no murmur Chest - No wheeze/rales/dullness Back - No focal tenderness Abd - Soft, non-tender Ext - No edema Neuro - Normal strength Skin - No rashes Psych - normal mood, and behavior  01/18/2014    CLINICAL DATA:  Persistent lung infiltrate. Worsening shortness of breath.   EXAM: CT CHEST WITH CONTRAST   TECHNIQUE: Multidetector CT imaging of the chest was performed during intravenous contrast administration.   CONTRAST:  80mL OMNIPAQUE IOHEXOL 300 MG/ML  SOLN   COMPARISON:  Multiple prior chest x-rays.   FINDINGS:  The chest wall is unremarkable. Breast masses, supraclavicular or axillary adenopathy. Small scattered lymph nodes are noted. The thyroid gland appears normal. The bony thorax is intact. No destructive bone lesions or spinal canal compromise. There is a benign-appearing sclerotic lesion in the T7 vertebral body.  The heart is normal in size. No pericardial effusion. No mediastinal or hilar mass. Scattered mediastinal and hilar lymph nodes are noted. No overt adenopathy. The aorta is normal in caliber. No dissection. The esophagus is grossly normal.  Examination of the lung parenchyma demonstrates a mosaic pattern of ground-glass attenuation in both lungs, right greater than left.  These findings could be due to reactive airways disease/ asthma, respiratory bronchiolitis, asymmetric pulmonary edema, alveolitis, cryptogenic organizing pneumonia and hypersensitivity pneumonitis. No bronchiectasis. No interstitial lung disease. The tracheobronchial tree is unremarkable. No worrisome pulmonary nodules or masses.  The upper abdomen is unremarkable.   IMPRESSION:  1. Mosaic lung attenuation pattern with patchy bilateral ground-glass opacities. This could be due to reactive airways disease/asthma, respiratory bronchiolitis, partial airspace filling process such as alveolitis, edema or cryptogenic organizing pneumonia. Hypersensitivity pneumonitis would be another possibility.  2. No focal airspace consolidation or worrisome pulmonary lesions. 3. Probable hyperplastic/inflammatory mediastinal and hilar lymph nodes.    Electronically Signed   By: Loralie ChampagneMark  Gallerani M.D.   On: 01/18/2014 15:55    02/08/2014    CLINICAL DATA:  Chest tightness and cough from a smoker   EXAM: CHEST  2 VIEW   COMPARISON:  None.   FINDINGS: Heart  size and vascular pattern are normal. No consolidation or effusion. Very mild background interstitial attenuation in the mid to lower lung zones is stable. No pleural effusions.   IMPRESSION:  No acute findings.  Stable mild interstitial prominence.    Electronically Signed   By: Esperanza Heiraymond  Rubner M.D.   On: 02/08/2014 16:19    CMP     Component Value Date/Time   NA 138 01/11/2014 1634   K 3.5 01/11/2014 1634   CL 105 01/11/2014 1634   CO2 26 01/11/2014 1634   GLUCOSE 106* 01/11/2014 1634   BUN 11 01/11/2014 1634   CREATININE 0.9 01/11/2014 1634   CALCIUM 9.1 01/11/2014 1634   PROT 6.8 01/11/2014 1634   ALBUMIN 4.1 01/11/2014 1634   AST 14 01/11/2014 1634   ALT 15 01/11/2014 1634   ALKPHOS 27* 01/11/2014 1634   BILITOT 0.4 01/11/2014 1634    CBC    Component Value Date/Time   WBC 10.4 01/11/2014 1634   RBC 3.85* 01/11/2014 1634   HGB 11.7* 01/11/2014 1634   HCT  34.7* 01/11/2014 1634   PLT 257.0 01/11/2014 1634   MCV 90.1 01/11/2014 1634   MCHC 33.7 01/11/2014 1634   RDW 14.0 01/11/2014 1634    Lab Results  Component Value Date   ANA Negative 02/08/2014   RF <10 02/08/2014    Lab Results  Component Value Date   ESRSEDRATE 19 02/08/2014       Assessment:  She has dyspnea, extensive history of smoking, and persistent pulmonary infiltrates.  Recent PFT's show diffusion defect.   Plan:  She is scheduled for bronchoscopy.  Plan is for airway inspection, BAL, and transbronchial biopsy.  Procedure explained to patient.  Risks detailed as bleeding, infection, pneumothorax, and non diagnosis.  Coralyn HellingVineet Kaileia Flow, MD [facility] 02/15/2014, 8:03 AM Pager:  [phone] After 3pm call: [phone]

## 2014-02-15 NOTE — Telephone Encounter (Signed)
Pt is returning call.  [name] ° °

## 2014-02-15 NOTE — Telephone Encounter (Signed)
lmtcb x2 

## 2014-02-15 NOTE — Progress Notes (Signed)
Video Bronchoscopy Done  Intervention Bronchial washing done Intervention bronchial biopsy done  Procedure tolerated well

## 2014-02-15 NOTE — H&P (Signed)
CC: Dyspnea  History of Present Illness: Julie CancerConnie R Price is a 48 y.o. female smoker with dyspnea.  She was told she had COPD/emphysema after having a breathing test about 3 years ago. She is a smoker. She started smoking at age 48, and smoked up to 2 packs per day. She is now smoking about 1 pack per day.   She has a regular cough with thick green to white sputum. She also gets episodes of wheezing. She denies hemoptysis or recent fever. She can walk about 100 feet before getting winded. She sometimes feels short of breath at rest also. She feels like she can't take a deep breath.   She was on antibiotics about a month ago for pneumonia, and was on prednisone then also. She gets bronchitis frequently. Chest xray from November 07, 2013 showed right lower lobe infiltrate.   She has trouble with her sleep. She has history of fibromyalgia and mgraines.   She is currently disabled. 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 [place] She has two pet dogs, and two cats. She denies exposure to tuberculosis.   Her father had COPD/emphysema and lung cancer at age 47.  She continues to have trouble with her breathing.  She feels tight in her chest.  She has a cough, but not much sputum.  She does not feel like the inhalers help her breathing.  She continues to smoke.   TESTS: A1AT 01/11/14 >> 154, MM CT chest 01/18/14 >> mosaic GGO b/l Rt > Lt PFT 01/25/14 >> FEV1 2.11 (73%), FEV1% 86, TLC 4.40 (87%), DLCO 70%, no BD   [name]  has a past medical history of Abscess; Depression; Headache(784.0); Fibromyalgia; Ovarian cyst; and Endometriosis.  [name]  has past surgical history that includes Appendectomy (1993); Abdominal hysterectomy (1993); Cholecystectomy; Breast surgery (2008); and Examination under anesthesia (02/15/2012).  Prior to Admission medications   Medication Sig Start Date End Date Taking? Authorizing Provider  albuterol (PROVENTIL HFA;VENTOLIN  HFA) 108 (90 BASE) MCG/ACT inhaler Inhale 2 puffs into the lungs every 6 (six) hours.    Yes Historical Provider, MD  ALPRAZolam Prudy Feeler(XANAX) 1 MG tablet Take 1 mg by mouth 3 (three) times daily.    Yes Historical Provider, MD  amphetamine-dextroamphetamine (ADDERALL XR) 15 MG 24 hr capsule Take 15 mg by mouth every morning.   Yes Historical Provider, MD  budesonide-formoterol (SYMBICORT) 160-4.5 MCG/ACT inhaler Inhale 2 puffs into the lungs 2 (two) times daily. 01/11/14  Yes Coralyn HellingVineet Salvatore Shear, MD  Dextromethorphan-Guaifenesin (MUCINEX DM MAXIMUM STRENGTH PO) Take 20 mLs by mouth daily as needed (for cold).   Yes Historical Provider, MD  FLUoxetine HCl (PROZAC PO) Take 80 mg by mouth at bedtime.    Yes Historical Provider, MD  guaiFENesin-codeine (ROBITUSSIN AC) 100-10 MG/5ML syrup Take 15 mLs by mouth daily as needed for cough or congestion.   Yes Historical Provider, MD  oxyCODONE-acetaminophen (PERCOCET) 10-325 MG per tablet Take 1 tablet by mouth every 6 (six) hours as needed for pain.   Yes Historical Provider, MD  pregabalin (LYRICA) 75 MG capsule Take 75 mg by mouth 3 (three) times daily.   Yes Historical Provider, MD  promethazine (PHENERGAN) 25 MG tablet Take 25 mg by mouth every 6 (six) hours as needed for nausea or vomiting (migraine).   Yes Historical Provider, MD  rizatriptan (MAXALT) 10 MG tablet Take 20 mg by mouth daily as needed for migraine. May repeat in 2 hours  if needed   Yes Historical Provider, MD  tiotropium (SPIRIVA) 18 MCG inhalation capsule Place 18 mcg into inhaler and inhale daily.   Yes Historical Provider, MD  tiZANidine (ZANAFLEX) 4 MG tablet Take 4 mg by mouth every 6 (six) hours as needed for muscle spasms.   Yes Historical Provider, MD    Allergies  Allergen Reactions  . Cortisone Acetate Shortness Of Breath and Other (See Comments)    Makes patient feel as if she is choking.  . Vicodin [Hydrocodone-Acetaminophen] Itching   family history includes Cancer in her father.  There is no history of Other.  History   Social History  . Marital Status: Married    Spouse Name: N/A    Number of Children: N/A  . Years of Education: N/A   Occupational History  . Not on file.   Social History Main Topics  . Smoking status: Current Every Day Smoker -- 1.00 packs/day for 30 years    Types: Cigarettes  . Smokeless tobacco: Never Used  . Alcohol Use: No  . Drug Use: No  . Sexual Activity: Yes    Birth Control/ Protection: Surgical   Other Topics Concern  . Not on file   Social History Narrative  . No narrative on file   ROS: Negative except above  Physical Exam:  General - No distress ENT - No sinus tenderness, no oral exudate, no LAN Cardiac - s1s2 regular, no murmur Chest - No wheeze/rales/dullness Back - No focal tenderness Abd - Soft, non-tender Ext - No edema Neuro - Normal strength Skin - No rashes Psych - normal mood, and behavior  01/18/2014    CLINICAL DATA:  Persistent lung infiltrate. Worsening shortness of breath.   EXAM: CT CHEST WITH CONTRAST   TECHNIQUE: Multidetector CT imaging of the chest was performed during intravenous contrast administration.   CONTRAST:  80mL OMNIPAQUE IOHEXOL 300 MG/ML  SOLN   COMPARISON:  Multiple prior chest x-rays.   FINDINGS:  The chest wall is unremarkable. Breast masses, supraclavicular or axillary adenopathy. Small scattered lymph nodes are noted. The thyroid gland appears normal. The bony thorax is intact. No destructive bone lesions or spinal canal compromise. There is a benign-appearing sclerotic lesion in the T7 vertebral body.  The heart is normal in size. No pericardial effusion. No mediastinal or hilar mass. Scattered mediastinal and hilar lymph nodes are noted. No overt adenopathy. The aorta is normal in caliber. No dissection. The esophagus is grossly normal.  Examination of the lung parenchyma demonstrates a mosaic pattern of ground-glass attenuation in both lungs, right greater than left.  These findings could be due to reactive airways disease/ asthma, respiratory bronchiolitis, asymmetric pulmonary edema, alveolitis, cryptogenic organizing pneumonia and hypersensitivity pneumonitis. No bronchiectasis. No interstitial lung disease. The tracheobronchial tree is unremarkable. No worrisome pulmonary nodules or masses.  The upper abdomen is unremarkable.   IMPRESSION:  1. Mosaic lung attenuation pattern with patchy bilateral ground-glass opacities. This could be due to reactive airways disease/asthma, respiratory bronchiolitis, partial airspace filling process such as alveolitis, edema or cryptogenic organizing pneumonia. Hypersensitivity pneumonitis would be another possibility.  2. No focal airspace consolidation or worrisome pulmonary lesions. 3. Probable hyperplastic/inflammatory mediastinal and hilar lymph nodes.    Electronically Signed   By: Loralie ChampagneMark  Gallerani M.D.   On: 01/18/2014 15:55    02/08/2014    CLINICAL DATA:  Chest tightness and cough from a smoker   EXAM: CHEST  2 VIEW   COMPARISON:  None.   FINDINGS: Heart  size and vascular pattern are normal. No consolidation or effusion. Very mild background interstitial attenuation in the mid to lower lung zones is stable. No pleural effusions.   IMPRESSION:  No acute findings.  Stable mild interstitial prominence.    Electronically Signed   By: Esperanza Heir M.D.   On: 02/08/2014 16:19    CMP     Component Value Date/Time   NA 138 01/11/2014 1634   K 3.5 01/11/2014 1634   CL 105 01/11/2014 1634   CO2 26 01/11/2014 1634   GLUCOSE 106* 01/11/2014 1634   BUN 11 01/11/2014 1634   CREATININE 0.9 01/11/2014 1634   CALCIUM 9.1 01/11/2014 1634   PROT 6.8 01/11/2014 1634   ALBUMIN 4.1 01/11/2014 1634   AST 14 01/11/2014 1634   ALT 15 01/11/2014 1634   ALKPHOS 27* 01/11/2014 1634   BILITOT 0.4 01/11/2014 1634    CBC    Component Value Date/Time   WBC 10.4 01/11/2014 1634   RBC 3.85* 01/11/2014 1634   HGB 11.7* 01/11/2014 1634   HCT  34.7* 01/11/2014 1634   PLT 257.0 01/11/2014 1634   MCV 90.1 01/11/2014 1634   MCHC 33.7 01/11/2014 1634   RDW 14.0 01/11/2014 1634    Lab Results  Component Value Date   ANA Negative 02/08/2014   RF <10 02/08/2014    Lab Results  Component Value Date   ESRSEDRATE 19 02/08/2014       Assessment:  She has dyspnea, extensive history of smoking, and persistent pulmonary infiltrates.  Recent PFT's show diffusion defect.   Plan:  She is scheduled for bronchoscopy.  Plan is for airway inspection, BAL, and transbronchial biopsy.  Procedure explained to patient.  Risks detailed as bleeding, infection, pneumothorax, and non diagnosis.  Coralyn Helling, MD [facility] Pulmonary/Critical Care 02/15/2014, 8:04 AM Pager:  [phone] After 3pm call: [phone]

## 2014-02-15 NOTE — Procedures (Signed)
Bronchoscopy Procedure Note [name] [id number] January 27, 1966  Procedure: Bronchoscopy Indications: 48 year old female smoker with progressive dyspnea and recurrent pulmonary infiltrates.  Procedure Details Consent: Risks of procedure as well as the alternatives and risks of each were explained to the patient.  Consent for procedure obtained. Time Out: Verified patient identification, verified procedure, site/side was marked, verified correct patient position, special equipment/implants available, medications/allergies/relevent history reviewed, required imaging and test results available.  Performed  She was brought to the bronchoscopy suite.  She was given cetacaine spray for topical anesthesia of posterior pharynx.  She was given total of 8 mg versed and 100 mcg fentanyl for sedation and analgesia.  The bronchoscope was entered orally.  The vocal cords were visualized.  She was noted to have white colored exudate over posterior pharynx and vocal cords consistent with thrush.  One percent lidocaine was instilled for topical anesthesia of the airways.  The bronchoscope was entered into the trachea with visualization of the carina.    The bronchoscope was then entered into the left main bronchus.  The left upper, lingular, and lower lobes were visualized.  There were no endobronchial lesions, and the mucosa appeared normal.    The bronchoscope was then entered into the right main bronchus.  The right upper, middle, and lower lobes were visualized.  There were no endobronchial lesions, and the mucosa appeared normal.  The bronchoscope was then wedged into anterior segment of right upper lobe with instillation of 60 ml of saline.  With suctioning 20 ml of cloudy, white fluid was returned.  Then using fluoroscopic guidance, transbronchial biopsies were obtained from right upper lobe anterior segment x three passes.  There was minimal bleeding after biopsy that resolved after instilling  saline.  No other immediate complications.  Post-procedure chest xray did not show evidence for pneumothorax.  She was transferred to recovery room in stable condition.  Specimens: BAL Rt upper lobe for cytology, culture, AFB, fungal culture, CD4:CD8 ratio.  Transbronchial biopsy x 3 from Rt upper lobe for surgical pathology.  Disposition: Findings d/w patient's husband.  Will send script for diflucan to treat thrush.  Will call with results of bronchoscopy when available.  Procedure was performed with assistance of Anders SimmondsPete Babcock, ACNP.  Julie HellingVineet Rhiley Tarver, MD [facility] 02/15/2014, 10:04 AM Pager:  [phone] After 3pm call: [phone]

## 2014-02-16 LAB — HYPERSENSITIVITY PNUEMONITIS PROFILE

## 2014-02-17 LAB — CULTURE, BAL-QUANTITATIVE W GRAM STAIN: Colony Count: >=100000

## 2014-02-18 ENCOUNTER — Encounter (HOSPITAL_COMMUNITY): Payer: Self-pay | Admitting: Pulmonary Disease

## 2014-02-20 ENCOUNTER — Telehealth: Payer: Self-pay | Admitting: Pulmonary Disease

## 2014-02-20 MED ORDER — PREDNISONE 20 MG PO TABS: 30.0000 mg | ORAL_TABLET | Freq: Every day | ORAL | Status: DC

## 2014-02-20 NOTE — Telephone Encounter (Signed)
Labs 02/08/14 >> Hypersensitivity panel negative, RF < 10, ANA negative, ESR 19 Bronchoscopy 02/15/14 >> CD4:CD8 1:1, Tbx RUL reactive bronchial epithelial cells, BAL RUL histiocytes/neutrophils/reactive epithelial cells, WBC 23 (N16, L3, M81), Quant cx negative  Results d/w pt over phone.  Will give course of prednisone >> will start with 30 mg daily and have her continue this until scheduled ROV on 02/28/14.

## 2014-02-27 ENCOUNTER — Encounter (HOSPITAL_COMMUNITY): Payer: Self-pay | Admitting: Pulmonary Disease

## 2014-02-28 ENCOUNTER — Ambulatory Visit (INDEPENDENT_AMBULATORY_CARE_PROVIDER_SITE_OTHER): Payer: Medicare Other | Admitting: Pulmonary Disease

## 2014-02-28 ENCOUNTER — Encounter: Payer: Self-pay | Admitting: Pulmonary Disease

## 2014-02-28 VITALS — BP 90/70 | HR 67 | Ht 63.0 in | Wt 176.0 lb

## 2014-02-28 DIAGNOSIS — J8410 Pulmonary fibrosis, unspecified: Secondary | ICD-10-CM

## 2014-02-28 DIAGNOSIS — R0600 Dyspnea, unspecified: Secondary | ICD-10-CM

## 2014-02-28 DIAGNOSIS — J849 Interstitial pulmonary disease, unspecified: Secondary | ICD-10-CM

## 2014-02-28 DIAGNOSIS — Z720 Tobacco use: Secondary | ICD-10-CM

## 2014-02-28 DIAGNOSIS — R0683 Snoring: Secondary | ICD-10-CM

## 2014-02-28 DIAGNOSIS — R0609 Other forms of dyspnea: Secondary | ICD-10-CM

## 2014-02-28 DIAGNOSIS — R0989 Other specified symptoms and signs involving the circulatory and respiratory systems: Secondary | ICD-10-CM

## 2014-02-28 DIAGNOSIS — F17200 Nicotine dependence, unspecified, uncomplicated: Secondary | ICD-10-CM

## 2014-02-28 DIAGNOSIS — R918 Other nonspecific abnormal finding of lung field: Secondary | ICD-10-CM

## 2014-02-28 MED ORDER — BUPROPION HCL ER (SMOKING DET) 150 MG PO TB12: 150.0000 mg | ORAL_TABLET | Freq: Two times a day (BID) | ORAL | Status: DC

## 2014-02-28 NOTE — Assessment & Plan Note (Signed)
Reviewed options for smoking cessation.  She is willing to try bupropion.  Reviewed side effects to monitor for, and discussed dosing regimen and setting quit date.

## 2014-02-28 NOTE — Progress Notes (Signed)
Chief Complaint  Patient presents with  . COPD    Feels breathing is worse since last OV. Reports SOB, coughing, chest tightness and wheezing.    History of Present Illness: [name] is a 48 y.o. female smoker with dyspnea with pulmonary infiltrates.  She continues to smoker 1 ppd.  She still gets winded with activity.  She still has cough, but not as much congestion.  She remains on prednisone 20 mg daily.  She is using her inhalers.  She denies fever, or skin rash.  She does get wheeze, and is hoarse in her throat.  TESTS: A1AT 01/11/14 >> 154, MM CT chest 01/18/14 >> mosaic GGO b/l Rt > Lt PFT 01/25/14 >> FEV1 2.11 (73%), FEV1% 86, TLC 4.40 (87%), DLCO 70%, no BD PSG 02/08/14 >> AHI 0.4, SaO2 low 90%, PLMI 0.7, sleep onset 0 minutes. Labs 02/08/14 >> Hypersensitivity panel negative, RF < 10, ANA negative, ESR 19  Bronchoscopy 02/15/14 >> CD4:CD8 1:1, Tbx RUL reactive bronchial epithelial cells, BAL RUL histiocytes/neutrophils/reactive epithelial cells, WBC 23 (N16, L3, M81), Quant cx negative   [name]  has a past medical history of Abscess; Depression; Headache(784.0); Fibromyalgia; Ovarian cyst; and Endometriosis.  TONY FRISCIA  has past surgical history that includes Appendectomy (1993); Abdominal hysterectomy (1993); Cholecystectomy; Breast surgery (2008); Examination under anesthesia (02/15/2012); and Video bronchoscopy (Bilateral, 02/15/2014).  Prior to Admission medications   Medication Sig Start Date End Date Taking? Authorizing Provider  albuterol (PROVENTIL HFA;VENTOLIN HFA) 108 (90 BASE) MCG/ACT inhaler Inhale 2 puffs into the lungs every 6 (six) hours.    Yes Historical Provider, MD  ALPRAZolam Duanne Moron) 1 MG tablet Take 1 mg by mouth 3 (three) times daily.    Yes Historical Provider, MD  amphetamine-dextroamphetamine (ADDERALL XR) 15 MG 24 hr capsule Take 15 mg by mouth every morning.   Yes Historical Provider, MD  budesonide-formoterol (SYMBICORT) 160-4.5  MCG/ACT inhaler Inhale 2 puffs into the lungs 2 (two) times daily. 01/11/14  Yes Chesley Mires, MD  Dextromethorphan-Guaifenesin (MUCINEX DM MAXIMUM STRENGTH PO) Take 20 mLs by mouth daily as needed (for cold).   Yes Historical Provider, MD  FLUoxetine HCl (PROZAC PO) Take 80 mg by mouth at bedtime.    Yes Historical Provider, MD  guaiFENesin-codeine (ROBITUSSIN AC) 100-10 MG/5ML syrup Take 15 mLs by mouth daily as needed for cough or congestion.   Yes Historical Provider, MD  oxyCODONE-acetaminophen (PERCOCET) 10-325 MG per tablet Take 1 tablet by mouth every 6 (six) hours as needed for pain.   Yes Historical Provider, MD  pregabalin (LYRICA) 75 MG capsule Take 75 mg by mouth 3 (three) times daily.   Yes Historical Provider, MD  promethazine (PHENERGAN) 25 MG tablet Take 25 mg by mouth every 6 (six) hours as needed for nausea or vomiting (migraine).   Yes Historical Provider, MD  rizatriptan (MAXALT) 10 MG tablet Take 20 mg by mouth daily as needed for migraine. May repeat in 2 hours if needed   Yes Historical Provider, MD  tiotropium (SPIRIVA) 18 MCG inhalation capsule Place 18 mcg into inhaler and inhale daily.   Yes Historical Provider, MD  tiZANidine (ZANAFLEX) 4 MG tablet Take 4 mg by mouth every 6 (six) hours as needed for muscle spasms.   Yes Historical Provider, MD    Allergies  Allergen Reactions  . Cortisone Acetate Shortness Of Breath and Other (See Comments)    Makes patient feel as if she is choking.  . Vicodin [Hydrocodone-Acetaminophen] Itching  Physical Exam:  General - No distress ENT - No sinus tenderness, no oral exudate, no LAN Cardiac - s1s2 regular, no murmur Chest - No wheeze/rales/dullness Back - No focal tenderness Abd - Soft, non-tender Ext - No edema Neuro - Normal strength Skin - No rashes Psych - normal mood, and behavior   Assessment/Plan:  Chesley Mires, MD [facility]/Sleep Pager:  [phone]

## 2014-02-28 NOTE — Assessment & Plan Note (Addendum)
Likely from ILD and possible COPD.  Will continue inhaler therapy for now.

## 2014-02-28 NOTE — Assessment & Plan Note (Signed)
Sleep study was negative for sleep apnea.

## 2014-02-28 NOTE — Assessment & Plan Note (Signed)
Bronchoscopy and lab results were unrevealing.  Will continue prednisone 20 mg daily for now.  Will repeat CT chest w/o contrast in two weeks and call her with results.

## 2014-02-28 NOTE — Patient Instructions (Signed)
Will schedule CT chest to be done in two weeks and call with results Bupropion 150 mg pill >> one pill daily for 3 days, then one pill twice daily.  Set quit date for stopping cigarettes for one week after starting bupropion. Follow up in 4 to 6 weeks

## 2014-03-01 ENCOUNTER — Emergency Department (HOSPITAL_COMMUNITY)
Admission: EM | Admit: 2014-03-01 | Discharge: 2014-03-01 | Discharge status: Against Medical Advice | Payer: Medicare Other | Attending: Emergency Medicine | Admitting: Emergency Medicine

## 2014-03-01 ENCOUNTER — Encounter (HOSPITAL_COMMUNITY): Payer: Self-pay | Admitting: Emergency Medicine

## 2014-03-01 DIAGNOSIS — F17200 Nicotine dependence, unspecified, uncomplicated: Secondary | ICD-10-CM | POA: Insufficient documentation

## 2014-03-01 DIAGNOSIS — F329 Major depressive disorder, single episode, unspecified: Secondary | ICD-10-CM | POA: Insufficient documentation

## 2014-03-01 DIAGNOSIS — M545 Low back pain, unspecified: Secondary | ICD-10-CM | POA: Insufficient documentation

## 2014-03-01 DIAGNOSIS — Z8719 Personal history of other diseases of the digestive system: Secondary | ICD-10-CM | POA: Insufficient documentation

## 2014-03-01 DIAGNOSIS — F3289 Other specified depressive episodes: Secondary | ICD-10-CM | POA: Diagnosis not present

## 2014-03-01 DIAGNOSIS — M549 Dorsalgia, unspecified: Secondary | ICD-10-CM

## 2014-03-01 DIAGNOSIS — Z8742 Personal history of other diseases of the female genital tract: Secondary | ICD-10-CM | POA: Diagnosis not present

## 2014-03-01 DIAGNOSIS — G8929 Other chronic pain: Secondary | ICD-10-CM

## 2014-03-01 DIAGNOSIS — IMO0002 Reserved for concepts with insufficient information to code with codable children: Secondary | ICD-10-CM | POA: Diagnosis not present

## 2014-03-01 DIAGNOSIS — G8921 Chronic pain due to trauma: Secondary | ICD-10-CM | POA: Insufficient documentation

## 2014-03-01 DIAGNOSIS — Z79899 Other long term (current) drug therapy: Secondary | ICD-10-CM | POA: Insufficient documentation

## 2014-03-01 NOTE — ED Notes (Signed)
Transporter in to take pt to radiology - pt not in room.  Called for pt in lobby X2 without reply, pt AMA.

## 2014-03-01 NOTE — ED Notes (Signed)
Pt is being seen by [facility] for R lower back pain r/t fall she had in November.  Pt states that for the last week the pain has increased.  She would like an X-ray to see what's wrong.

## 2014-03-01 NOTE — ED Provider Notes (Signed)
CSN: [id number]     Arrival date & time 03/01/14  1425 History  This chart was scribed for non-physician practitioner, Ebbie Ridgehris Kassidie Hendriks, PA-C working with Shelda JakesScott W. Zackowski, MD by Luisa DagoPriscilla Tutu, ED scribe. This patient was seen in room TR06C/TR06C and the patient's care was started at 2:50 PM.    No chief complaint on file.  HPI HPI Comments: [name] is a 48 y.o. female who presents to the Emergency Department complaining of worsening back pain that started two weeks ago. Pt states that she's being seen by [facility] for right sided lower back pain for a fall that occurred in November 2014. She states that this past week her pain has increased and would like to X-ray her back to see if anything is fractured. Pt reports a history of a pinched nerve in her back. She states that she had back problem prior to the fall. Denies any fever, chills, diaphoresis, nausea, emesis, saddle paraesthesia, SOB, or chest pain.   Past Medical History  Diagnosis Date  . Abscess     rectal  . Depression   . Headache(784.0)   . Fibromyalgia   . Ovarian cyst   . Endometriosis    Past Surgical History  Procedure Laterality Date  . Appendectomy  1993  . Abdominal hysterectomy  1993  . Cholecystectomy    . Breast surgery  2008    left breast lumpectomy  . Examination under anesthesia  02/15/2012    Procedure: EXAM UNDER ANESTHESIA;  Surgeon: Maisie Fushomas A. Cornett, MD;  Location: [facility];  Service: General;  Laterality: Left;  Excision of perineal cyst  . Video bronchoscopy Bilateral 02/15/2014    Procedure: VIDEO BRONCHOSCOPY WITH FLUORO;  Surgeon: Coralyn HellingVineet Sood, MD;  Location: [facility];  Service: Cardiopulmonary;  Laterality: Bilateral;   Family History  Problem Relation Age of Onset  . Cancer Father     lung  . Other Neg Hx    History  Substance Use Topics  . Smoking status: Current Every Day Smoker -- 1.00 packs/day for 30 years    Types: Cigarettes  . Smokeless  tobacco: Never Used  . Alcohol Use: No   OB History   Grav Para Term Preterm Abortions TAB SAB Ect Mult Living   2 2 2  0 0 0 0 0 0 2     Review of Systems A complete 10 system review of systems was obtained and all systems are negative except as noted in the HPI and PMH.     Allergies  Cortisone acetate and Vicodin  Home Medications   Prior to Admission medications   Medication Sig Start Date End Date Taking? Authorizing Provider  albuterol (PROVENTIL HFA;VENTOLIN HFA) 108 (90 BASE) MCG/ACT inhaler Inhale 2 puffs into the lungs every 6 (six) hours.     Historical Provider, MD  ALPRAZolam Prudy Feeler(XANAX) 1 MG tablet Take 1 mg by mouth 3 (three) times daily.     Historical Provider, MD  amphetamine-dextroamphetamine (ADDERALL XR) 15 MG 24 hr capsule Take 15 mg by mouth every morning.    Historical Provider, MD  budesonide-formoterol (SYMBICORT) 160-4.5 MCG/ACT inhaler Inhale 2 puffs into the lungs 2 (two) times daily. 01/11/14   Coralyn HellingVineet Sood, MD  buPROPion (ZYBAN) 150 MG 12 hr tablet Take 1 tablet (150 mg total) by mouth 2 (two) times daily. 02/28/14   Coralyn HellingVineet Sood, MD  Dextromethorphan-Guaifenesin (MUCINEX DM MAXIMUM STRENGTH PO) Take 20 mLs by mouth daily as needed (for cold).    Historical  Provider, MD  FLUoxetine HCl (PROZAC PO) Take 80 mg by mouth at bedtime.     Historical Provider, MD  guaiFENesin-codeine (ROBITUSSIN AC) 100-10 MG/5ML syrup Take 15 mLs by mouth daily as needed for cough or congestion.    Historical Provider, MD  OPANA ER, CRUSH RESISTANT, 10 MG T12A 12 hr tablet Take 1 tablet by mouth 2 (two) times daily. 02/18/14   Historical Provider, MD  oxyCODONE-acetaminophen (PERCOCET) 10-325 MG per tablet Take 1 tablet by mouth every 6 (six) hours as needed for pain.    Historical Provider, MD  predniSONE (DELTASONE) 20 MG tablet Take 1.5 tablets (30 mg total) by mouth daily with breakfast. 02/20/14   Coralyn HellingVineet Sood, MD  pregabalin (LYRICA) 75 MG capsule Take 75 mg by mouth 3 (three) times  daily.    Historical Provider, MD  promethazine (PHENERGAN) 25 MG tablet Take 25 mg by mouth every 6 (six) hours as needed for nausea or vomiting (migraine).    Historical Provider, MD  rizatriptan (MAXALT) 10 MG tablet Take 20 mg by mouth daily as needed for migraine. May repeat in 2 hours if needed    Historical Provider, MD  tiotropium (SPIRIVA) 18 MCG inhalation capsule Place 18 mcg into inhaler and inhale daily.    Historical Provider, MD  tiZANidine (ZANAFLEX) 4 MG tablet Take 4 mg by mouth every 6 (six) hours as needed for muscle spasms.    Historical Provider, MD   BP 105/67  Pulse 75  Temp(Src) 97.9 F (36.6 C) (Oral)  Resp 18  SpO2 96%  Physical Exam  Nursing note and vitals reviewed. Constitutional: She is oriented to person, place, and time. She appears well-developed and well-nourished. No distress.  HENT:  Head: Normocephalic and atraumatic.  Mouth/Throat: Oropharynx is clear and moist.  Eyes: EOM are normal. Pupils are equal, round, and reactive to light.  Neck: Normal range of motion. Neck supple.  Cardiovascular: Normal rate, regular rhythm and normal heart sounds.  Exam reveals no gallop and no friction rub.   No murmur heard. Pulmonary/Chest: Effort normal and breath sounds normal. No respiratory distress.  Musculoskeletal: Normal range of motion.  Neurological: She is alert and oriented to person, place, and time. She has normal reflexes. She exhibits normal muscle tone. Coordination normal.  Skin: Skin is warm and dry.  Psychiatric: She has a normal mood and affect. Her behavior is normal.    ED Course  Procedures (including critical care time)  DIAGNOSTIC STUDIES: Oxygen Saturation is 96% on RA, adequate by my interpretation.    COORDINATION OF CARE: 3:00 PM-Patient informed of clinical course, understand medical decision-making process, and agree with plan. Patient is, advised we'll obtain x-rays.  She does not have any neurological deficits noted on exam.   Has normal reflexes.  The patient revised.  Follow up with her primary care Dr. and her pain management clinic  I personally performed the services described in this documentation, which was scribed in my presence. The recorded information has been reviewed and is accurate.    Carlyle Dollyhristopher W Tatisha Cerino, PA-C 03/06/14 [phone]

## 2014-03-06 NOTE — ED Provider Notes (Signed)
Medical screening examination/treatment/procedure(s) were performed by non-physician practitioner and as supervising physician I was immediately available for consultation/collaboration.   EKG Interpretation None       Shelda JakesScott W. Shamiah Kahler, MD 03/06/14 [id number]

## 2014-03-11 LAB — FUNGUS CULTURE W SMEAR: Fungal Smear: NONE SEEN

## 2014-03-14 ENCOUNTER — Other Ambulatory Visit: Payer: Self-pay | Admitting: Pain Medicine

## 2014-03-14 DIAGNOSIS — M5030 Other cervical disc degeneration, unspecified cervical region: Secondary | ICD-10-CM

## 2014-03-15 ENCOUNTER — Inpatient Hospital Stay: Admission: RE | Admit: 2014-03-15 | Payer: Medicare Other | Source: Ambulatory Visit

## 2014-03-16 ENCOUNTER — Other Ambulatory Visit: Payer: Medicare Other

## 2014-03-20 ENCOUNTER — Ambulatory Visit (INDEPENDENT_AMBULATORY_CARE_PROVIDER_SITE_OTHER)
Admission: RE | Admit: 2014-03-20 | Discharge: 2014-03-20 | Disposition: A | Discharge status: Home / Self Care | Payer: Medicare Other | Source: Ambulatory Visit | Attending: Pulmonary Disease | Admitting: Pulmonary Disease

## 2014-03-20 DIAGNOSIS — J8410 Pulmonary fibrosis, unspecified: Secondary | ICD-10-CM

## 2014-03-21 ENCOUNTER — Ambulatory Visit
Admission: RE | Admit: 2014-03-21 | Discharge: 2014-03-21 | Disposition: A | Discharge status: Home / Self Care | Payer: Medicare Other | Source: Ambulatory Visit | Attending: Pain Medicine | Admitting: Pain Medicine

## 2014-03-21 DIAGNOSIS — M5030 Other cervical disc degeneration, unspecified cervical region: Secondary | ICD-10-CM

## 2014-03-22 ENCOUNTER — Telehealth: Payer: Self-pay | Admitting: Pulmonary Disease

## 2014-03-22 NOTE — Telephone Encounter (Signed)
03/20/2014    CLINICAL DATA:  Followup interstitial lung disease.   EXAM: CT CHEST WITHOUT CONTRAST   TECHNIQUE: Multidetector CT imaging of the chest was performed following the standard protocol without intravenous contrast. High resolution imaging of the lungs, as well as inspiratory and expiratory imaging, was performed.   COMPARISON:  01/18/2014.  MR thoracic spine 06/27/2006.   FINDINGS:  No pathologically enlarged mediastinal lymph nodes. Hilar regions are difficult to definitively evaluate without IV contrast but appear grossly unremarkable. No axillary adenopathy. Heart size normal. No pericardial effusion.  There is persistent, but slightly improved, patchy bilateral ground-glass involving all lobes of both lungs, with some areas of new involvement bilaterally. No nodularity, traction bronchiectasis/ bronchiolectasis, subpleural reticulation, architectural distortion or honeycombing. No air trapping on inspiratory and expiratory imaging. No pleural fluid. Airway is unremarkable.  Incidental imaging of the upper abdomen shows the visualized portions of the liver, adrenal glands, kidneys, spleen, pancreas and stomach to be grossly unremarkable. No upper abdominal adenopathy. A fluffy area of sclerosis in the T7 vertebral body is unchanged from 01/18/2014 and also described on MR 06/27/2006, indicative of a benign lesion. Degenerative changes are seen in the spine.   IMPRESSION:  Persistent patchy ground-glass in the lungs bilaterally, with some areas improvement and other areas of new involvement. No fibrotic features. 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    Electronically Signed   By: Leanna Battles M.D.   On: 03/20/2014 16:33   Left message on pt's voicemail.  She has persistent pulmonary infiltrates.  Recent bronchoscopy was non diagnostic.  She has diffusion defect on recent PFT's.  I have placed order for evaluation by  thoracic surgery for VATS lung biopsy.  Will have my nurse f/u with pt to confirm receipt of message, and agreement with plan.

## 2014-03-26 ENCOUNTER — Encounter (HOSPITAL_COMMUNITY): Payer: Self-pay | Admitting: Emergency Medicine

## 2014-03-26 ENCOUNTER — Emergency Department (HOSPITAL_COMMUNITY)
Admission: EM | Admit: 2014-03-26 | Discharge: 2014-03-26 | Disposition: A | Discharge status: Home / Self Care | Payer: Medicare Other | Attending: Emergency Medicine | Admitting: Emergency Medicine

## 2014-03-26 DIAGNOSIS — Z8742 Personal history of other diseases of the female genital tract: Secondary | ICD-10-CM | POA: Diagnosis not present

## 2014-03-26 DIAGNOSIS — Z872 Personal history of diseases of the skin and subcutaneous tissue: Secondary | ICD-10-CM | POA: Insufficient documentation

## 2014-03-26 DIAGNOSIS — F17200 Nicotine dependence, unspecified, uncomplicated: Secondary | ICD-10-CM | POA: Diagnosis not present

## 2014-03-26 DIAGNOSIS — F3289 Other specified depressive episodes: Secondary | ICD-10-CM | POA: Diagnosis not present

## 2014-03-26 DIAGNOSIS — M545 Low back pain, unspecified: Secondary | ICD-10-CM | POA: Diagnosis present

## 2014-03-26 DIAGNOSIS — IMO0002 Reserved for concepts with insufficient information to code with codable children: Secondary | ICD-10-CM | POA: Diagnosis not present

## 2014-03-26 DIAGNOSIS — IMO0001 Reserved for inherently not codable concepts without codable children: Secondary | ICD-10-CM | POA: Diagnosis not present

## 2014-03-26 DIAGNOSIS — F329 Major depressive disorder, single episode, unspecified: Secondary | ICD-10-CM | POA: Insufficient documentation

## 2014-03-26 DIAGNOSIS — Z79899 Other long term (current) drug therapy: Secondary | ICD-10-CM | POA: Diagnosis not present

## 2014-03-26 LAB — URINE MICROSCOPIC-ADD ON

## 2014-03-26 LAB — URINALYSIS, ROUTINE W REFLEX MICROSCOPIC
Bilirubin Urine: NEGATIVE
Glucose, UA: NEGATIVE mg/dL
Ketones, ur: NEGATIVE mg/dL
Leukocytes, UA: NEGATIVE
Nitrite: NEGATIVE
Protein, ur: NEGATIVE mg/dL
Specific Gravity, Urine: 1.015 (ref 1.005–1.030)
Urobilinogen, UA: 1 mg/dL (ref 0.0–1.0)
pH: 6.5 (ref 5.0–8.0)

## 2014-03-26 MED ORDER — KETOROLAC TROMETHAMINE 60 MG/2ML IM SOLN
60.0000 mg | Freq: Once | INTRAMUSCULAR | Status: AC
  Administered 2014-03-26: 60 mg via INTRAMUSCULAR
  Filled 2014-03-26: qty 2

## 2014-03-26 NOTE — Discharge Instructions (Signed)
If you were given medicines take as directed.  If you are on coumadin or contraceptives realize their levels and effectiveness is altered by many different medicines.  If you have any reaction (rash, tongues swelling, other) to the medicines stop taking and see a physician.   Please follow up as directed and return to the ER or see a physician for new or worsening symptoms.  Thank you. Filed Vitals:   03/26/14 0516 03/26/14 0545 03/26/14 0600  BP: 112/57 113/53 115/66  Pulse: 53 59 49  Temp: 97.9 F (36.6 C)    TempSrc: Oral    Resp: 18    Height: 5\' 2"  (1.575 m)    Weight: 172 lb (78.019 kg)    SpO2: 95% 97% 98%

## 2014-03-26 NOTE — Telephone Encounter (Signed)
Pt did receive VS's message. She is willing to do the lung biopsy.

## 2014-03-26 NOTE — ED Notes (Signed)
MD at bedside. 

## 2014-03-26 NOTE — ED Notes (Signed)
Dr Zavitz at bedside  

## 2014-03-26 NOTE — ED Notes (Signed)
Pt states that starting yesterday morning she has been having left lower back pain.  Pt states she does have chronic back pain and is followed at a pain clinic, but that this pain feels different than normal

## 2014-03-31 LAB — AFB CULTURE WITH SMEAR (NOT AT ARMC): Acid Fast Smear: NONE SEEN

## 2014-04-01 NOTE — ED Provider Notes (Addendum)
CSN: [id number]     Arrival date & time 03/26/14  0505 History   First MD Initiated Contact with Patient 03/26/14 [phone]     Chief Complaint  Patient presents with  . Back Pain     (Consider location/radiation/quality/duration/timing/severity/associated sxs/prior Treatment) HPI Comments: 48 yo female with hx of fibromyalgia, bipolar, smoking, back pain presents with left lower back pain worse than her normal.  Pt is seen at pain clinic and feels her pain has gradually worsened the past few days.  Patient denies urinary or bowel changes, active cancer, extremity weakness, IVDU, fevers, immunosuppression or significant trauma. Worse with movement. Hx of pinched nerve in back. Pt on oxy/ opana for pain prior to today.    Patient is a 48 y.o. female presenting with back pain. The history is provided by the patient.  Back Pain Associated symptoms: no chest pain, no fever, no headaches, no numbness and no weakness     Past Medical History  Diagnosis Date  . Abscess     rectal  . Depression   . Headache(784.0)   . Fibromyalgia   . Ovarian cyst   . Endometriosis   . Back pain    Past Surgical History  Procedure Laterality Date  . Appendectomy  1993  . Abdominal hysterectomy  1993  . Cholecystectomy    . Breast surgery  2008    left breast lumpectomy  . Examination under anesthesia  02/15/2012    Procedure: EXAM UNDER ANESTHESIA;  Surgeon: Maisie Fushomas A. Cornett, MD;  Location: [facility];  Service: General;  Laterality: Left;  Excision of perineal cyst  . Video bronchoscopy Bilateral 02/15/2014    Procedure: VIDEO BRONCHOSCOPY WITH FLUORO;  Surgeon: Coralyn HellingVineet Sood, MD;  Location: [facility];  Service: Cardiopulmonary;  Laterality: Bilateral;   Family History  Problem Relation Age of Onset  . Cancer Father     lung  . Other Neg Hx    History  Substance Use Topics  . Smoking status: Current Every Day Smoker -- 1.00 packs/day for 30 years    Types: Cigarettes  . Smokeless  tobacco: Never Used  . Alcohol Use: No   OB History   Grav Para Term Preterm Abortions TAB SAB Ect Mult Living   2 2 2  0 0 0 0 0 0 2     Review of Systems  Constitutional: Negative for fever.  Cardiovascular: Negative for chest pain and leg swelling.  Musculoskeletal: Positive for back pain. Negative for gait problem.  Neurological: Negative for weakness, numbness and headaches.      Allergies  Cortisone acetate and Vicodin  Home Medications   Prior to Admission medications   Medication Sig Start Date End Date Taking? Authorizing Provider  albuterol (PROVENTIL HFA;VENTOLIN HFA) 108 (90 BASE) MCG/ACT inhaler Inhale 2 puffs into the lungs every 6 (six) hours as needed for wheezing or shortness of breath.    Yes Historical Provider, MD  ALPRAZolam Prudy Feeler(XANAX) 1 MG tablet Take 1 mg by mouth 4 (four) times daily.    Yes Historical Provider, MD  amphetamine-dextroamphetamine (ADDERALL XR) 15 MG 24 hr capsule Take 15 mg by mouth every morning.   Yes Historical Provider, MD  budesonide-formoterol (SYMBICORT) 160-4.5 MCG/ACT inhaler Inhale 2 puffs into the lungs 2 (two) times daily. 01/11/14  Yes Coralyn HellingVineet Sood, MD  buPROPion (ZYBAN) 150 MG 12 hr tablet Take 1 tablet (150 mg total) by mouth 2 (two) times daily. 02/28/14  Yes Coralyn HellingVineet Sood, MD  FANAPT 6 MG TABS Take  6 mg by mouth 2 (two) times daily.  02/26/14  Yes Historical Provider, MD  FLUoxetine (PROZAC) 40 MG capsule Take 80 mg by mouth at bedtime.   Yes Historical Provider, MD  lamoTRIgine (LAMICTAL) 200 MG tablet Take 200 mg by mouth 2 (two) times daily.  02/17/14  Yes Historical Provider, MD  OPANA ER, CRUSH RESISTANT, 10 MG T12A 12 hr tablet Take 1 tablet by mouth 2 (two) times daily. 02/18/14  Yes Historical Provider, MD  oxybutynin (DITROPAN) 5 MG tablet Take 5 mg by mouth 2 (two) times daily.  02/20/14  Yes Historical Provider, MD  oxyCODONE-acetaminophen (PERCOCET) 10-325 MG per tablet Take 1 tablet by mouth every 6 (six) hours as needed for  pain.   Yes Historical Provider, MD  pregabalin (LYRICA) 75 MG capsule Take 75 mg by mouth 3 (three) times daily.   Yes Historical Provider, MD  promethazine (PHENERGAN) 25 MG tablet Take 25 mg by mouth every 6 (six) hours as needed for nausea or vomiting (migraine).   Yes Historical Provider, MD  rizatriptan (MAXALT) 10 MG tablet Take 20 mg by mouth daily as needed for migraine. May repeat in 2 hours if needed   Yes Historical Provider, MD  tiotropium (SPIRIVA) 18 MCG inhalation capsule Place 18 mcg into inhaler and inhale daily.   Yes Historical Provider, MD  tiZANidine (ZANAFLEX) 4 MG tablet Take 4 mg by mouth every 6 (six) hours as needed for muscle spasms.   Yes Historical Provider, MD  zolpidem (AMBIEN) 10 MG tablet Take 10 mg by mouth at bedtime.  02/25/14  Yes Historical Provider, MD   BP 115/66  Pulse 49  Temp(Src) 97.9 F (36.6 C) (Oral)  Resp 18  Ht 5\' 2"  (1.575 m)  Wt 172 lb (78.019 kg)  BMI 31.45 kg/m2  SpO2 98% Physical Exam  Nursing note and vitals reviewed. Constitutional: She is oriented to person, place, and time. She appears well-developed and well-nourished.  HENT:  Head: Normocephalic and atraumatic.  Eyes: Conjunctivae are normal. Right eye exhibits no discharge. Left eye exhibits no discharge.  Neck: Normal range of motion. Neck supple. No tracheal deviation present.  Cardiovascular: Normal rate.   Pulmonary/Chest: Effort normal.  Abdominal: Soft. She exhibits no distension. There is no tenderness. There is no guarding.  Musculoskeletal: She exhibits tenderness (mild left lower back/ flank paraspinal, no step off, no midline tenderness). She exhibits no edema.  Neurological: She is alert and oriented to person, place, and time.  Reflex Scores:      Patellar reflexes are 2+ on the right side and 2+ on the left side.      Achilles reflexes are 1+ on the right side and 1+ on the left side. Pt has 5+ strength LE bilateral with f/e of hips/ knees and great  toe Sensation intact bilateral LE to touch   Skin: Skin is warm. No rash noted.  Psychiatric: She has a normal mood and affect.    ED Course  Procedures (including critical care time) Emergency Focused Ultrasound Exam Limited retroperitoneal ultrasound of kidneys  Performed and interpreted by Dr. Jodi Mourning Indication: flank pain Focused abdominal ultrasound with both kidneys imaged in transverse and longitudinal planes in real-time. Interpretation: no hydronephrosis visualized.   Images archived electronically  Labs Review Labs Reviewed  URINALYSIS, ROUTINE W REFLEX MICROSCOPIC - Abnormal; Notable for the following:    Hgb urine dipstick SMALL (*)    All other components within normal limits  URINE MICROSCOPIC-ADD ON    Imaging  Review No results found. No results found.  EKG Interpretation None      MDM   Final diagnoses:  Left low back pain    Acute on chronic pain. Likely MSK/ disc dz.  Medications  ketorolac (TORADOL) injection 60 mg (60 mg Intramuscular Given 03/26/14 0605)   No abnormal neuro findings in ED.  Pt has outpt fup,. Discussed no narcotics as hx of overall similar pain. No red flags today.  Results and differential diagnosis were discussed with the patient/parent/guardian. Close follow up outpatient was discussed, comfortable with the plan.   Filed Vitals:   03/26/14 0516 03/26/14 0545 03/26/14 0600  BP: 112/57 113/53 115/66  Pulse: 53 59 49  Temp: 97.9 F (36.6 C)    TempSrc: Oral    Resp: 18    Height: 5\' 2"  (1.575 m)    Weight: 172 lb (78.019 kg)    SpO2: 95% 97% 98%       Enid Skeens, MD 04/01/14 2225  Enid Skeens, MD 04/05/14 2150

## 2014-04-12 ENCOUNTER — Ambulatory Visit (INDEPENDENT_AMBULATORY_CARE_PROVIDER_SITE_OTHER): Payer: Medicare Other | Admitting: Pulmonary Disease

## 2014-04-12 ENCOUNTER — Encounter: Payer: Self-pay | Admitting: Pulmonary Disease

## 2014-04-12 VITALS — BP 92/62 | HR 56 | Temp 97.6°F | Ht 63.0 in | Wt 180.0 lb

## 2014-04-12 DIAGNOSIS — R918 Other nonspecific abnormal finding of lung field: Secondary | ICD-10-CM

## 2014-04-12 DIAGNOSIS — Z720 Tobacco use: Secondary | ICD-10-CM

## 2014-04-12 DIAGNOSIS — F17200 Nicotine dependence, unspecified, uncomplicated: Secondary | ICD-10-CM

## 2014-04-12 NOTE — Progress Notes (Signed)
Chief Complaint  Patient presents with  . Follow-up    Pt reports that breathing has worsened some since last OV-- C/O incr chest tightness SOB.    History of Present Illness: [name] is a 48 y.o. female smoker with dyspnea with pulmonary infiltrates.  She continues to have trouble with her breathing and with a cough.  She is bringing up green sputum.  She denies wheeze, fever, or hemoptysis.  She has not noticed improvement with inhaler therapy.  Symbicort makes her chest hurt.  She continues to smoke cigarettes.  TESTS: A1AT 01/11/14 >> 154, MM CT chest 01/18/14 >> mosaic GGO b/l Rt > Lt PFT 01/25/14 >> FEV1 2.11 (73%), FEV1% 86, TLC 4.40 (87%), DLCO 70%, no BD PSG 02/08/14 >> AHI 0.4, SaO2 low 90%, PLMI 0.7, sleep onset 0 minutes. Labs 02/08/14 >> Hypersensitivity panel negative, RF < 10, ANA negative, ESR 19  Bronchoscopy 02/15/14 >> CD4:CD8 1:1, Tbx RUL reactive bronchial epithelial cells, BAL RUL histiocytes/neutrophils/reactive epithelial cells, WBC 23 (N16, L3, M81), Quant cx negative CT chest 03/20/14 >> persistent patchy b/l GGO ?DIP 04/12/14 Refer to thoracic surgery  [name]  has a past medical history of Abscess; Depression; Headache(784.0); Fibromyalgia; Ovarian cyst; Endometriosis; and Back pain.  [name]  has past surgical history that includes Appendectomy (1993); Abdominal hysterectomy (1993); Cholecystectomy; Breast surgery (2008); Examination under anesthesia (02/15/2012); and Video bronchoscopy (Bilateral, 02/15/2014).  Prior to Admission medications   Medication Sig Start Date End Date Taking? Authorizing Provider  albuterol (PROVENTIL HFA;VENTOLIN HFA) 108 (90 BASE) MCG/ACT inhaler Inhale 2 puffs into the lungs every 6 (six) hours.    Yes Historical Provider, MD  ALPRAZolam Duanne Moron) 1 MG tablet Take 1 mg by mouth 3 (three) times daily.    Yes Historical Provider, MD  amphetamine-dextroamphetamine (ADDERALL XR) 15 MG 24 hr capsule Take 15 mg  by mouth every morning.   Yes Historical Provider, MD  budesonide-formoterol (SYMBICORT) 160-4.5 MCG/ACT inhaler Inhale 2 puffs into the lungs 2 (two) times daily. 01/11/14  Yes Chesley Mires, MD  Dextromethorphan-Guaifenesin (MUCINEX DM MAXIMUM STRENGTH PO) Take 20 mLs by mouth daily as needed (for cold).   Yes Historical Provider, MD  FLUoxetine HCl (PROZAC PO) Take 80 mg by mouth at bedtime.    Yes Historical Provider, MD  guaiFENesin-codeine (ROBITUSSIN AC) 100-10 MG/5ML syrup Take 15 mLs by mouth daily as needed for cough or congestion.   Yes Historical Provider, MD  oxyCODONE-acetaminophen (PERCOCET) 10-325 MG per tablet Take 1 tablet by mouth every 6 (six) hours as needed for pain.   Yes Historical Provider, MD  pregabalin (LYRICA) 75 MG capsule Take 75 mg by mouth 3 (three) times daily.   Yes Historical Provider, MD  promethazine (PHENERGAN) 25 MG tablet Take 25 mg by mouth every 6 (six) hours as needed for nausea or vomiting (migraine).   Yes Historical Provider, MD  rizatriptan (MAXALT) 10 MG tablet Take 20 mg by mouth daily as needed for migraine. May repeat in 2 hours if needed   Yes Historical Provider, MD  tiotropium (SPIRIVA) 18 MCG inhalation capsule Place 18 mcg into inhaler and inhale daily.   Yes Historical Provider, MD  tiZANidine (ZANAFLEX) 4 MG tablet Take 4 mg by mouth every 6 (six) hours as needed for muscle spasms.   Yes Historical Provider, MD    Allergies  Allergen Reactions  . Cortisone Acetate Shortness Of Breath and Other (See Comments)    Makes patient feel as if she is  choking.  . Vicodin [Hydrocodone-Acetaminophen] Itching     Physical Exam:  General - No distress ENT - No sinus tenderness, no oral exudate, no LAN Cardiac - s1s2 regular, no murmur Chest - faint b/l crackles, no wheeze Back - No focal tenderness Abd - Soft, non-tender Ext - No edema Neuro - Normal strength Skin - No rashes Psych - normal mood, and behavior   Assessment/Plan:  Chesley Mires, MD [facility] Pager:  [phone]

## 2014-04-12 NOTE — Patient Instructions (Signed)
Will arrange for referral to thoracic surgery to assess for lung biopsy Follow up in 8 weeks

## 2014-04-18 ENCOUNTER — Institutional Professional Consult (permissible substitution) (INDEPENDENT_AMBULATORY_CARE_PROVIDER_SITE_OTHER): Payer: Medicare Other | Admitting: Cardiothoracic Surgery

## 2014-04-18 ENCOUNTER — Encounter: Payer: Self-pay | Admitting: Cardiothoracic Surgery

## 2014-04-18 ENCOUNTER — Encounter: Payer: Self-pay | Admitting: Pulmonary Disease

## 2014-04-18 VITALS — BP 84/56 | HR 58 | Resp 16 | Ht 63.0 in | Wt 180.0 lb

## 2014-04-18 DIAGNOSIS — J8410 Pulmonary fibrosis, unspecified: Secondary | ICD-10-CM

## 2014-04-18 DIAGNOSIS — J849 Interstitial pulmonary disease, unspecified: Secondary | ICD-10-CM

## 2014-04-18 DIAGNOSIS — R918 Other nonspecific abnormal finding of lung field: Secondary | ICD-10-CM

## 2014-04-18 NOTE — Progress Notes (Signed)
[address]       [place] [address]             [phone]                    [name] [facility] Medical Record #[id number] Date of Birth: May 03, 1966  Referring: Coralyn Helling, MD Primary Care: Thayer Headings, MD  Chief Complaint:    Chief Complaint  Patient presents with  . Shortness of Breath    with pulmonary infiltrates...eval for lung bxs    History of Present Illness:    [name] 48 y.o. female is seen in the office  today for interstitial lung disease and biopsy. The patient has a history of fibromyalgia and chronic narcotic use she is disabled because of her pulmonary problems m and back problems. She has not tolerated taking prednisone in the past. She is referred by Dr. Craige Cotta because of migratory pulmonary infiltrates and to consider lung biopsy.  Patient continues to smoke on a daily basis  Current Activity/ Functional Status:  Patient is independent with mobility/ambulation, transfers, ADL's, IADL's.   Zubrod Score: At the time of surgery this patient's most appropriate activity status/level should be described as: []     0    Normal activity, no symptoms [x]     1    Restricted in physical strenuous activity but ambulatory, able to do out light work []     2    Ambulatory and capable of self care, unable to do work activities, up and about               >50 % of waking hours                              []     3    Only limited self care, in bed greater than 50% of waking hours []     4    Completely disabled, no self care, confined to bed or chair []     5    Moribund   Past Medical History  Diagnosis Date  . Abscess     rectal  . Depression   . Headache(784.0)   . Fibromyalgia   . Ovarian cyst   . Endometriosis   . Back pain     Past Surgical History  Procedure Laterality Date  . Appendectomy  1993  . Abdominal hysterectomy  1993  . Cholecystectomy    . Breast surgery  2008    left breast lumpectomy  .  Examination under anesthesia  02/15/2012    Procedure: EXAM UNDER ANESTHESIA;  Surgeon: Maisie Fus A. Cornett, MD;  Location: [facility];  Service: General;  Laterality: Left;  Excision of perineal cyst  . Video bronchoscopy Bilateral 02/15/2014    Procedure: VIDEO BRONCHOSCOPY WITH FLUORO;  Surgeon: Coralyn Helling, MD;  Location: [facility];  Service: Cardiopulmonary;  Laterality: Bilateral;    Family History  Problem Relation Age of Onset  . Cancer Father     lung  . COPD Father   . Early death Father   . Other Neg Hx   . Hypertension Sister   . Cancer Maternal Aunt     LUNG CANCER    History   Social History  . Marital Status: Married    Spouse Name: N/A    Number of Children: N/A  . Years of Education: N/A   Occupational  History  . Not on file.   Social History Main Topics  . Smoking status: Current Every Day Smoker -- 1.00 packs/day for 30 years    Types: Cigarettes  . Smokeless tobacco: Never Used  . Alcohol Use: No  . Drug Use: No  . Sexual Activity: Yes    Birth Control/ Protection: Surgical   Other Topics Concern  . Not on file   Social History Narrative  . No narrative on file    History  Smoking status  . Current Every Day Smoker -- 1.00 packs/day for 30 years  . Types: Cigarettes  Smokeless tobacco  . Never Used    History  Alcohol Use No     Allergies  Allergen Reactions  . Cortisone Acetate Shortness Of Breath and Other (See Comments)    Makes patient feel as if she is choking.  . Vicodin [Hydrocodone-Acetaminophen] Itching    Current Outpatient Prescriptions  Medication Sig Dispense Refill  . albuterol (PROVENTIL HFA;VENTOLIN HFA) 108 (90 BASE) MCG/ACT inhaler Inhale 2 puffs into the lungs every 6 (six) hours as needed for wheezing or shortness of breath.       . ALPRAZolam (XANAX) 1 MG tablet Take 1 mg by mouth 4 (four) times daily.       Marland Kitchen amphetamine-dextroamphetamine (ADDERALL XR) 15 MG 24 hr capsule Take 15 mg by mouth  every morning.      Marland Kitchen buPROPion (ZYBAN) 150 MG 12 hr tablet Take 1 tablet (150 mg total) by mouth 2 (two) times daily.  60 tablet  3  . FANAPT 6 MG TABS Take 6 mg by mouth 2 (two) times daily.       Marland Kitchen FLUoxetine (PROZAC) 40 MG capsule Take 80 mg by mouth at bedtime.      . lamoTRIgine (LAMICTAL) 200 MG tablet Take 200 mg by mouth 2 (two) times daily.       . OPANA ER, CRUSH RESISTANT, 10 MG T12A 12 hr tablet Take 1 tablet by mouth 2 (two) times daily.      Marland Kitchen oxybutynin (DITROPAN) 5 MG tablet Take 5 mg by mouth 2 (two) times daily.       Marland Kitchen oxyCODONE-acetaminophen (PERCOCET) 10-325 MG per tablet Take 1 tablet by mouth every 6 (six) hours as needed for pain.      . pregabalin (LYRICA) 75 MG capsule Take 75 mg by mouth 3 (three) times daily.      . promethazine (PHENERGAN) 25 MG tablet Take 25 mg by mouth every 6 (six) hours as needed for nausea or vomiting (migraine).      . rizatriptan (MAXALT) 10 MG tablet Take 20 mg by mouth daily as needed for migraine. May repeat in 2 hours if needed      . tiotropium (SPIRIVA) 18 MCG inhalation capsule Place 18 mcg into inhaler and inhale daily.      Marland Kitchen tiZANidine (ZANAFLEX) 4 MG tablet Take 4 mg by mouth every 6 (six) hours as needed for muscle spasms.      Marland Kitchen zolpidem (AMBIEN) 10 MG tablet Take 10 mg by mouth at bedtime.        No current facility-administered medications for this visit.     Review of Systems:     Cardiac Review of Systems: Y or N  Chest Pain [    ]  Resting SOB [   ] Exertional SOB  [  ]  Orthopnea [  ]   Pedal Edema [   ]  Palpitations [  ] Syncope  [  ]   Presyncope [   ]  General Review of Systems: [Y] = yes [  ]=no Constitional: recent weight change [  ];  Wt loss over the last 3 months [   ] anorexia [  ]; fatigue [  ]; nausea [  ]; night sweats [  ]; fever [  ]; or chills [  ];          Dental: poor dentition[  ]; Last Dentist visit:   Eye : blurred vision [  ]; diplopia [   ]; vision changes [  ];  Amaurosis fugax[  ]; Resp:  cough [  ];  wheezing[  ];  hemoptysis[  ]; shortness of breath[  ]; paroxysmal nocturnal dyspnea[  ]; dyspnea on exertion[  ]; or orthopnea[  ];  GI:  gallstones[  ], vomiting[  ];  dysphagia[  ]; melena[  ];  hematochezia [  ]; heartburn[  ];   Hx of  Colonoscopy[  ]; GU: kidney stones [  ]; hematuria[  ];   dysuria [  ];  nocturia[  ];  history of     obstruction [  ]; urinary frequency [  ]             Skin: rash, swelling[  ];, hair loss[  ];  peripheral edema[  ];  or itching[  ]; Musculosketetal: myalgias[  ];  joint swelling[  ];  joint erythema[  ];  joint pain[  ];  back pain[  ];  Heme/Lymph: bruising[  ];  bleeding[  ];  anemia[  ];  Neuro: TIA[  ];  headaches[  ];  stroke[  ];  vertigo[  ];  seizures[  ];   paresthesias[  ];  difficulty walking[  ];  Psych:depression[  ]; anxiety[  ];  Endocrine: diabetes[  ];  thyroid dysfunction[  ];  Immunizations: Flu up to date [  ]; Pneumococcal up to date [  ];  Other: Patient's review of systems is positive for loss of appetite decreased energy weight loss chest pain with tightness at rest shortness of breath on exertion shortness of breath at rest dizzy spells pain with cough and deep breathing bronchitis wheezing constipation reflux difficulty swallowing blood in her urine kidney stones varicose Veins headaches chronic pain muscle pain anxiety depression nervousness and easy bruisability  Physical Exam: BP 84/56  Pulse 58  Resp 16  Ht 5\' 3"  (1.6 m)  Wt 180 lb (81.647 kg)  BMI 31.89 kg/m2  SpO2 97%  PHYSICAL EXAMINATION:  General appearance: alert, cooperative, appears older than stated age and fatigued Neurologic: intact Heart: regular rate and rhythm, S1, S2 normal, no murmur, click, rub or gallop Lungs: diminished breath sounds bilaterally Abdomen: soft, non-tender; bowel sounds normal; no masses,  no organomegaly Extremities: extremities normal, atraumatic, no cyanosis or edema and Homans sign is negative, no sign of  DVT Patient has no carotid bruits, no cervical or supraclavicular adenopathy   Diagnostic Studies & Laboratory data:     Recent Radiology Findings:   Mr Cervical Spine Wo Contrast  03/22/2014   CLINICAL DATA:  Neck and back pain since falling November 2014.  EXAM: MRI CERVICAL SPINE WITHOUT CONTRAST  TECHNIQUE: Multiplanar, multisequence MR imaging of the cervical spine was performed. No intravenous contrast was administered.  COMPARISON:  01/31/2010 CT.  11/18/2005.  FINDINGS: Patient was moving throughout the exam. Sequences repeated. Images remained motion degraded.  Right cerebellar tonsils  slightly low lying but within the range of normal limits. Visualized intracranial structures unremarkable.  No focal cervical cord signal abnormality.  Visualized paravertebral structures unremarkable. Both vertebral arteries are patent.  C2-3: Minimal left foraminal narrowing secondary to uncinate hypertrophy.  C3-4:  Minimal bulge/spur greater to left.  Minimal left-sided  C4-5:  Minimal bulge.  C5-6: Broad-based disc osteophyte complex. Mild cord contact with minimal flattening. Minimal right-sided with mild left-sided uncinate hypertrophy and foraminal narrowing.  C6-7: Shallow disc osteophyte with mild spinal stenosis without cord flattening. Minimal left foraminal narrowing.  C7-T1: Negative.  IMPRESSION: Motion degraded examination reveals progressive degenerative changes at the C5-6 level as detailed above.   Electronically Signed   By: Bridgett LarssonSteve  Olson M.D.   On: 03/22/2014 00:01   Ct Chest High Resolution  03/20/2014   CLINICAL DATA:  Followup interstitial lung disease.  EXAM: CT CHEST WITHOUT CONTRAST  TECHNIQUE: Multidetector CT imaging of the chest was performed following the standard protocol without intravenous contrast. High resolution imaging of the lungs, as well as inspiratory and expiratory imaging, was performed.  COMPARISON:  01/18/2014.  MR thoracic spine 06/27/2006.  FINDINGS: No pathologically  enlarged mediastinal lymph nodes. Hilar regions are difficult to definitively evaluate without IV contrast but appear grossly unremarkable. No axillary adenopathy. Heart size normal. No pericardial effusion.  There is persistent, but slightly improved, patchy bilateral ground-glass involving all lobes of both lungs, with some areas of new involvement bilaterally. No nodularity, traction bronchiectasis/ bronchiolectasis, subpleural reticulation, architectural distortion or honeycombing. No air trapping on inspiratory and expiratory imaging. No pleural fluid. Airway is unremarkable.  Incidental imaging of the upper abdomen shows the visualized portions of the liver, adrenal glands, kidneys, spleen, pancreas and stomach to be grossly unremarkable. No upper abdominal adenopathy. A fluffy area of sclerosis in the T7 vertebral body is unchanged from 01/18/2014 and also described on MR 06/27/2006, indicative of a benign lesion. Degenerative changes are seen in the spine.  IMPRESSION: Persistent patchy ground-glass in the lungs bilaterally, with some areas improvement and other areas of new involvement. No fibrotic features. 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   Electronically Signed   By: Leanna BattlesMelinda  Blietz M.D.   On: 03/20/2014 16:33      Recent Lab Findings: Lab Results  Component Value Date   WBC 10.4 01/11/2014   HGB 11.7* 01/11/2014   HCT 34.7* 01/11/2014   PLT 257.0 01/11/2014   GLUCOSE 106* 01/11/2014   ALT 15 01/11/2014   AST 14 01/11/2014   NA 138 01/11/2014   K 3.5 01/11/2014   CL 105 01/11/2014   CREATININE 0.9 01/11/2014   BUN 11 01/11/2014   CO2 26 01/11/2014   TSH 2.252 Test methodology is 3rd generation TSH 08/22/2008      Assessment / Plan:   I've discussed with the patient the need to immediately stop smoking with her migratory pulmonary infiltrates and considering possible lung biopsy. I've explained to her that if she  stopped smoking today and this not smoking for the next 3-4 weeks we will proceed with lung biopsy. I've explained to her there is no point in doing a open lung biopsy if you continues to smoke. I plan to see her back in 3-4 weeks and we'll make a final decision about lung biopsy. She notes that she has been given counseling and smoking aids to assist her in stopping.   I spent 55 minutes counseling the patient face to face.  The total time spent in the appointment was 80 minutes.  Delight OvensEdward B Gerhardt MD      [address] [place] [id number] Office [phone]   Beeper [phone]  04/18/2014 4:34 PM

## 2014-04-18 NOTE — Patient Instructions (Signed)
Smoking Cessation Quitting smoking is important to your health and has many advantages. However, it is not always easy to quit since nicotine is a very addictive drug. Often times, people try 3 times or more before being able to quit. This document explains the best ways for you to prepare to quit smoking. Quitting takes hard work and a lot of effort, but you can do it. ADVANTAGES OF QUITTING SMOKING  You will live longer, feel better, and live better.  Your body will feel the impact of quitting smoking almost immediately.  Within 20 minutes, blood pressure decreases. Your pulse returns to its normal level.  After 8 hours, carbon monoxide levels in the blood return to normal. Your oxygen level increases.  After 24 hours, the chance of having a heart attack starts to decrease. Your breath, hair, and body stop smelling like smoke.  After 48 hours, damaged nerve endings begin to recover. Your sense of taste and smell improve.  After 72 hours, the body is virtually free of nicotine. Your bronchial tubes relax and breathing becomes easier.  After 2 to 12 weeks, lungs can hold more air. Exercise becomes easier and circulation improves.  The risk of having a heart attack, stroke, cancer, or lung disease is greatly reduced.  After 1 year, the risk of coronary heart disease is cut in half.  After 5 years, the risk of stroke falls to the same as a nonsmoker.  After 10 years, the risk of lung cancer is cut in half and the risk of other cancers decreases significantly.  After 15 years, the risk of coronary heart disease drops, usually to the level of a nonsmoker.  If you are pregnant, quitting smoking will improve your chances of having a healthy baby.  The people you live with, especially any children, will be healthier.  You will have extra money to spend on things other than cigarettes. QUESTIONS TO THINK ABOUT BEFORE ATTEMPTING TO QUIT You may want to talk about your answers with your  caregiver.  Why do you want to quit?  If you tried to quit in the past, what helped and what did not?  What will be the most difficult situations for you after you quit? How will you plan to handle them?  Who can help you through the tough times? Your family? Friends? A caregiver?  What pleasures do you get from smoking? What ways can you still get pleasure if you quit? Here are some questions to ask your caregiver:  How can you help me to be successful at quitting?  What medicine do you think would be best for me and how should I take it?  What should I do if I need more help?  What is smoking withdrawal like? How can I get information on withdrawal? GET READY  Set a quit date.  Change your environment by getting rid of all cigarettes, ashtrays, matches, and lighters in your home, car, or work. Do not let people smoke in your home.  Review your past attempts to quit. Think about what worked and what did not. GET SUPPORT AND ENCOURAGEMENT You have a better chance of being successful if you have help. You can get support in many ways.  Tell your family, friends, and co-workers that you are going to quit and need their support. Ask them not to smoke around you.  Get individual, group, or telephone counseling and support. Programs are available at local hospitals and health centers. Call your local health department for   information about programs in your area.  Spiritual beliefs and practices may help some smokers quit.  Download a "quit meter" on your computer to keep track of quit statistics, such as how long you have gone without smoking, cigarettes not smoked, and money saved.  Get a self-help book about quitting smoking and staying off of tobacco. LEARN NEW SKILLS AND BEHAVIORS  Distract yourself from urges to smoke. Talk to someone, go for a walk, or occupy your time with a task.  Change your normal routine. Take a different route to work. Drink tea instead of coffee.  Eat breakfast in a different place.  Reduce your stress. Take a hot bath, exercise, or read a book.  Plan something enjoyable to do every day. Reward yourself for not smoking.  Explore interactive web-based programs that specialize in helping you quit. GET MEDICINE AND USE IT CORRECTLY Medicines can help you stop smoking and decrease the urge to smoke. Combining medicine with the above behavioral methods and support can greatly increase your chances of successfully quitting smoking.  Nicotine replacement therapy helps deliver nicotine to your body without the negative effects and risks of smoking. Nicotine replacement therapy includes nicotine gum, lozenges, inhalers, nasal sprays, and skin patches. Some may be available over-the-counter and others require a prescription.  Antidepressant medicine helps people abstain from smoking, but how this works is unknown. This medicine is available by prescription.  Nicotinic receptor partial agonist medicine simulates the effect of nicotine in your brain. This medicine is available by prescription. Ask your caregiver for advice about which medicines to use and how to use them based on your health history. Your caregiver will tell you what side effects to look out for if you choose to be on a medicine or therapy. Carefully read the information on the package. Do not use any other product containing nicotine while using a nicotine replacement product.  RELAPSE OR DIFFICULT SITUATIONS Most relapses occur within the first 3 months after quitting. Do not be discouraged if you start smoking again. Remember, most people try several times before finally quitting. You may have symptoms of withdrawal because your body is used to nicotine. You may crave cigarettes, be irritable, feel very hungry, cough often, get headaches, or have difficulty concentrating. The withdrawal symptoms are only temporary. They are strongest when you first quit, but they will go away within  10-14 days. To reduce the chances of relapse, try to:  Avoid drinking alcohol. Drinking lowers your chances of successfully quitting.  Reduce the amount of caffeine you consume. Once you quit smoking, the amount of caffeine in your body increases and can give you symptoms, such as a rapid heartbeat, sweating, and anxiety.  Avoid smokers because they can make you want to smoke.  Do not let weight gain distract you. Many smokers will gain weight when they quit, usually less than 10 pounds. Eat a healthy diet and stay active. You can always lose the weight gained after you quit.  Find ways to improve your mood other than smoking. FOR MORE INFORMATION  www.smokefree.gov  Document Released: 10/12/2001 Document Revised: 04/18/2012 Document Reviewed: 01/27/2012 [employer] Patient Information 2015 [employer]. This information is not intended to replace advice given to you by your health care provider. Make sure you discuss any questions you have with your health care provider.  

## 2014-04-19 NOTE — Assessment & Plan Note (Signed)
Again discussed options to help with smoking cessation.  Symbicort caused chest discomfort w/o benefit >> will stop this.  She can continue spiriva and prn albuterol for now.

## 2014-04-19 NOTE — Assessment & Plan Note (Signed)
She has persistent infiltrates on CT chest.  Concern is for ILD (?DIP).  Discussed how continue smoking can affect this.  Will have her assessed by thoracic surgery for VATS lung biopsy to further evaluate.

## 2014-05-07 ENCOUNTER — Other Ambulatory Visit: Payer: Self-pay | Admitting: Pain Medicine

## 2014-05-07 DIAGNOSIS — M545 Low back pain: Secondary | ICD-10-CM

## 2014-05-09 ENCOUNTER — Other Ambulatory Visit: Payer: Medicare Other

## 2014-05-09 ENCOUNTER — Ambulatory Visit
Admission: RE | Admit: 2014-05-09 | Discharge: 2014-05-09 | Disposition: A | Discharge status: Home / Self Care | Payer: Medicare Other | Source: Ambulatory Visit | Attending: Pain Medicine | Admitting: Pain Medicine

## 2014-05-09 DIAGNOSIS — M545 Low back pain: Secondary | ICD-10-CM

## 2014-05-16 ENCOUNTER — Other Ambulatory Visit: Payer: Self-pay | Admitting: *Deleted

## 2014-05-16 ENCOUNTER — Ambulatory Visit (INDEPENDENT_AMBULATORY_CARE_PROVIDER_SITE_OTHER): Payer: Medicare Other | Admitting: Cardiothoracic Surgery

## 2014-05-16 ENCOUNTER — Encounter: Payer: Self-pay | Admitting: Cardiothoracic Surgery

## 2014-05-16 VITALS — BP 84/55 | HR 80 | Resp 20 | Ht 63.0 in | Wt 180.0 lb

## 2014-05-16 DIAGNOSIS — J849 Interstitial pulmonary disease, unspecified: Secondary | ICD-10-CM

## 2014-05-16 DIAGNOSIS — R918 Other nonspecific abnormal finding of lung field: Secondary | ICD-10-CM

## 2014-05-16 DIAGNOSIS — J8410 Pulmonary fibrosis, unspecified: Secondary | ICD-10-CM

## 2014-05-16 NOTE — Progress Notes (Signed)
[address]       [place] [id number]             [phone]                    [name] Record #[id number] Date of Birth: 1966-07-03  Referring: Coralyn HellingSood, Vineet, MD Primary Care: Thayer HeadingsMACKENZIE,BRIAN, MD  Chief Complaint:    Chief Complaint  Patient presents with  . Interstitial Lung Disease    Further discuss possible lung BX    History of Present Illness:    [name] 48 y.o. female is seen in the office  today for interstitial lung disease and biopsy. The patient has a history of fibromyalgia and chronic narcotic use she is disabled because of her pulmonary problems m and back problems. She has not tolerated taking prednisone in the past. She is referred by Dr. Craige CottaSood because of migratory pulmonary infiltrates and to consider lung biopsy.  Patient continues to smoke on a daily basis. Since last seen she has decreased her smoking significantly now down to a pack a week per the patient, but she continues to smoke. Her rest for status has changed little since she was last seen, she still gets short of breath with exertion  Current Activity/ Functional Status:  Patient is independent with mobility/ambulation, transfers, ADL's, IADL's.   Zubrod Score: At the time of surgery this patient's most appropriate activity status/level should be described as: []     0    Normal activity, no symptoms [x]     1    Restricted in physical strenuous activity but ambulatory, able to do out light work []     2    Ambulatory and capable of self care, unable to do work activities, up and about               >50 % of waking hours                              []     3    Only limited self care, in bed greater than 50% of waking hours []     4    Completely disabled, no self care, confined to bed or chair []     5    Moribund   Past Medical History  Diagnosis Date  . Abscess     rectal  . Depression   . Headache(784.0)   . Fibromyalgia   . Ovarian  cyst   . Endometriosis   . Back pain     Past Surgical History  Procedure Laterality Date  . Appendectomy  1993  . Abdominal hysterectomy  1993  . Cholecystectomy    . Breast surgery  2008    left breast lumpectomy  . Examination under anesthesia  02/15/2012    Procedure: EXAM UNDER ANESTHESIA;  Surgeon: Maisie Fushomas A. Cornett, MD;  Location: [facility];  Service: General;  Laterality: Left;  Excision of perineal cyst  . Video bronchoscopy Bilateral 02/15/2014    Procedure: VIDEO BRONCHOSCOPY WITH FLUORO;  Surgeon: Coralyn HellingVineet Sood, MD;  Location: [facility];  Service: Cardiopulmonary;  Laterality: Bilateral;    Family History  Problem Relation Age of Onset  . Cancer Father     lung  . COPD Father   . Early death Father   . Other Neg Hx   . Hypertension Sister   . Cancer  Maternal Aunt     LUNG CANCER    History   Social History  . Marital Status: Married    Spouse Name: N/A    Number of Children: N/A  . Years of Education: N/A   Occupational History  . Not on file.   Social History Main Topics  . Smoking status: Current Every Day Smoker -- 1.00 packs/day for 30 years    Types: Cigarettes  . Smokeless tobacco: Never Used  . Alcohol Use: No  . Drug Use: No  . Sexual Activity: Yes    Birth Control/ Protection: Surgical   Other Topics Concern  . Not on file   Social History Narrative  . No narrative on file    History  Smoking status  . Current Every Day Smoker -- 1.00 packs/day for 30 years  . Types: Cigarettes  Smokeless tobacco  . Never Used    History  Alcohol Use No     Allergies  Allergen Reactions  . Cortisone Acetate Shortness Of Breath and Other (See Comments)    Makes patient feel as if she is choking.  . Vicodin [Hydrocodone-Acetaminophen] Itching    Current Outpatient Prescriptions  Medication Sig Dispense Refill  . albuterol (PROVENTIL HFA;VENTOLIN HFA) 108 (90 BASE) MCG/ACT inhaler Inhale 2 puffs into the lungs every 6 (six)  hours as needed for wheezing or shortness of breath.       . ALPRAZolam (XANAX) 1 MG tablet Take 1 mg by mouth 4 (four) times daily.       Marland Kitchen amphetamine-dextroamphetamine (ADDERALL XR) 15 MG 24 hr capsule Take 15 mg by mouth every morning.      Marland Kitchen buPROPion (ZYBAN) 150 MG 12 hr tablet Take 1 tablet (150 mg total) by mouth 2 (two) times daily.  60 tablet  3  . FANAPT 6 MG TABS Take 6 mg by mouth 2 (two) times daily.       Marland Kitchen FLUoxetine (PROZAC) 40 MG capsule Take 80 mg by mouth at bedtime.      . lamoTRIgine (LAMICTAL) 200 MG tablet Take 200 mg by mouth 2 (two) times daily.       . OPANA ER, CRUSH RESISTANT, 10 MG T12A 12 hr tablet Take 1 tablet by mouth 2 (two) times daily.      Marland Kitchen oxybutynin (DITROPAN) 5 MG tablet Take 5 mg by mouth 2 (two) times daily.       Marland Kitchen oxyCODONE-acetaminophen (PERCOCET) 10-325 MG per tablet Take 1 tablet by mouth every 6 (six) hours as needed for pain.      . pregabalin (LYRICA) 75 MG capsule Take 75 mg by mouth 3 (three) times daily.      . promethazine (PHENERGAN) 25 MG tablet Take 25 mg by mouth every 6 (six) hours as needed for nausea or vomiting (migraine).      . rizatriptan (MAXALT) 10 MG tablet Take 20 mg by mouth daily as needed for migraine. May repeat in 2 hours if needed      . tiotropium (SPIRIVA) 18 MCG inhalation capsule Place 18 mcg into inhaler and inhale daily.      Marland Kitchen tiZANidine (ZANAFLEX) 4 MG tablet Take 4 mg by mouth every 6 (six) hours as needed for muscle spasms.      Marland Kitchen zolpidem (AMBIEN) 10 MG tablet Take 10 mg by mouth at bedtime.        No current facility-administered medications for this visit.     Review of Systems:     Cardiac  Review of Systems: Y or N  Chest Pain [  n  ]  Resting SOB [ y  ] Exertional SOB  [ y ]  Orthopnea [  n]   Pedal Edema [ n  ]    Palpitations [n  ] Syncope  Milo.Brash  ]   Presyncope Cove.Etienne   ]  General Review of Systems: [Y] = yes [  ]=no Constitional: recent weight change [n  ];  Wt loss over the last 3 months [   ]  anorexia [  ]; fatigue [ y ]; nausea [  ]; night sweats [  ]; fever [  ]; or chills [  ];          Dental: poor dentition[  ]; Last Dentist visit:   Eye : blurred vision [  ]; diplopia [   ]; vision changes [  ];  Amaurosis fugax[  ]; Resp: cough [  ];  wheezing[y  ];  hemoptysis[n  ]; shortness of breath[ y ]; paroxysmal nocturnal dyspnea[  ]; dyspnea on exertion[y  ]; or orthopnea[  ];  GI:  gallstones[  ], vomiting[  ];  dysphagia[  ]; melena[  ];  hematochezia [  ]; heartburn[  ];   Hx of  Colonoscopy[  ]; GU: kidney stones [  ]; hematuria[  ];   dysuria [  ];  nocturia[  ];  history of     obstruction [  ]; urinary frequency [  ]             Skin: rash, swelling[  ];, hair loss[  ];  peripheral edema[  ];  or itching[  ]; Musculosketetal: myalgias[  ];  joint swelling[  ];  joint erythema[  ];  joint pain[  ];  back pain[  ];  Heme/Lymph: bruising[  ];  bleeding[  ];  anemia[  ];  Neuro: TIA[  ];  headaches[  ];  stroke[  ];  vertigo[  ];  seizures[  ];   paresthesias[  ];  difficulty walking[  ];  Psych:depression[  ]; anxiety[  ];  Endocrine: diabetes[  ];  thyroid dysfunction[  ];  Immunizations: Flu up to date [ ? ]; Pneumococcal up to date [?  ];  Other: Patient's review of systems is positive for loss of appetite decreased energy weight loss chest pain with tightness at rest shortness of breath on exertion shortness of breath at rest dizzy spells pain with cough and deep breathing bronchitis wheezing constipation reflux difficulty swallowing blood in her urine kidney stones varicose Veins headaches chronic pain muscle pain anxiety depression nervousness and easy bruisability  Physical Exam: BP 84/55  Pulse 80  Resp 20  Ht 5\' 3"  (1.6 m)  Wt 180 lb (81.647 kg)  BMI 31.89 kg/m2  SpO2 92%  PHYSICAL EXAMINATION:  General appearance: alert, cooperative, appears older than stated age and fatigued Neurologic: intact Heart: regular rate and rhythm, S1, S2 normal, no murmur, click, rub  or gallop Lungs: diminished breath sounds bilaterally Abdomen: soft, non-tender; bowel sounds normal; no masses,  no organomegaly Extremities: extremities normal, atraumatic, no cyanosis or edema and Homans sign is negative, no sign of DVT Patient has no carotid bruits, no cervical or supraclavicular adenopathy Patient has no wheezing on exam today  Diagnostic Studies & Laboratory data:     Recent Radiology Findings:   Mr Cervical Spine Wo Contrast  03/22/2014   CLINICAL DATA:  Neck and back pain since falling November 2014.  EXAM: MRI CERVICAL SPINE WITHOUT CONTRAST  TECHNIQUE: Multiplanar, multisequence MR imaging of the cervical spine was performed. No intravenous contrast was administered.  COMPARISON:  01/31/2010 CT.  11/18/2005.  FINDINGS: Patient was moving throughout the exam. Sequences repeated. Images remained motion degraded.  Right cerebellar tonsils slightly low lying but within the range of normal limits. Visualized intracranial structures unremarkable.  No focal cervical cord signal abnormality.  Visualized paravertebral structures unremarkable. Both vertebral arteries are patent.  C2-3: Minimal left foraminal narrowing secondary to uncinate hypertrophy.  C3-4:  Minimal bulge/spur greater to left.  Minimal left-sided  C4-5:  Minimal bulge.  C5-6: Broad-based disc osteophyte complex. Mild cord contact with minimal flattening. Minimal right-sided with mild left-sided uncinate hypertrophy and foraminal narrowing.  C6-7: Shallow disc osteophyte with mild spinal stenosis without cord flattening. Minimal left foraminal narrowing.  C7-T1: Negative.  IMPRESSION: Motion degraded examination reveals progressive degenerative changes at the C5-6 level as detailed above.   Electronically Signed   By: Bridgett Larsson M.D.   On: 03/22/2014 00:01   Ct Chest High Resolution  03/20/2014   CLINICAL DATA:  Followup interstitial lung disease.  EXAM: CT CHEST WITHOUT CONTRAST  TECHNIQUE: Multidetector CT imaging  of the chest was performed following the standard protocol without intravenous contrast. High resolution imaging of the lungs, as well as inspiratory and expiratory imaging, was performed.  COMPARISON:  01/18/2014.  MR thoracic spine 06/27/2006.  FINDINGS: No pathologically enlarged mediastinal lymph nodes. Hilar regions are difficult to definitively evaluate without IV contrast but appear grossly unremarkable. No axillary adenopathy. Heart size normal. No pericardial effusion.  There is persistent, but slightly improved, patchy bilateral ground-glass involving all lobes of both lungs, with some areas of new involvement bilaterally. No nodularity, traction bronchiectasis/ bronchiolectasis, subpleural reticulation, architectural distortion or honeycombing. No air trapping on inspiratory and expiratory imaging. No pleural fluid. Airway is unremarkable.  Incidental imaging of the upper abdomen shows the visualized portions of the liver, adrenal glands, kidneys, spleen, pancreas and stomach to be grossly unremarkable. No upper abdominal adenopathy. A fluffy area of sclerosis in the T7 vertebral body is unchanged from 01/18/2014 and also described on MR 06/27/2006, indicative of a benign lesion. Degenerative changes are seen in the spine.  IMPRESSION: Persistent patchy ground-glass in the lungs bilaterally, with some areas improvement and other areas of new involvement. No fibrotic features. 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   Electronically Signed   By: Leanna Battles M.D.   On: 03/20/2014 16:33      Recent Lab Findings: Lab Results  Component Value Date   WBC 10.4 01/11/2014   HGB 11.7* 01/11/2014   HCT 34.7* 01/11/2014   PLT 257.0 01/11/2014   GLUCOSE 106* 01/11/2014   ALT 15 01/11/2014   AST 14 01/11/2014   NA 138 01/11/2014   K 3.5 01/11/2014   CL 105 01/11/2014   CREATININE 0.9 01/11/2014   BUN 11 01/11/2014   CO2 26 01/11/2014   TSH  2.252 Test methodology is 3rd generation TSH 08/22/2008      Assessment / Plan:   I have again discussed with the patient the need to immediately stop smoking with her migratory pulmonary infiltrates and considering possible lung biopsy. She has significantly decreased but continues to smoke. I've explained to her on her previous visit if she stopped smokingand this not smoking for the next 3-4 weeks we will proceed with lung biopsy. I've explained to her there is  no point in doing a open lung biopsy if you continues to smoke. I asked her as to She notes that she has been given counseling and smoking aids to assist her in stopping.  Since the last scan that she had was now 3 months ago we will repeat a high-resolution CT scan of the chest to help determine the best site to biopsy and to evaluate any clearing of the process that was present. I'll see her back in one week and if she is not smoking proceed with open lung biopsy.   I spent 55 minutes counseling the patient face to face. The total time spent in the appointment was 80 minutes.  Delight Ovens MD      [address] [place] [place] [address] Office [phone]   Beeper [phone]  05/16/2014 2:38 PM

## 2014-05-22 ENCOUNTER — Ambulatory Visit
Admission: RE | Admit: 2014-05-22 | Discharge: 2014-05-22 | Disposition: A | Discharge status: Home / Self Care | Payer: Medicare Other | Source: Ambulatory Visit | Attending: Cardiothoracic Surgery | Admitting: Cardiothoracic Surgery

## 2014-05-22 DIAGNOSIS — J849 Interstitial pulmonary disease, unspecified: Secondary | ICD-10-CM

## 2014-05-24 ENCOUNTER — Ambulatory Visit (INDEPENDENT_AMBULATORY_CARE_PROVIDER_SITE_OTHER): Payer: Medicare Other | Admitting: Cardiothoracic Surgery

## 2014-05-24 ENCOUNTER — Encounter: Payer: Self-pay | Admitting: Cardiothoracic Surgery

## 2014-05-24 VITALS — BP 104/61 | HR 56 | Resp 16 | Ht 63.0 in | Wt 180.0 lb

## 2014-05-24 DIAGNOSIS — R918 Other nonspecific abnormal finding of lung field: Secondary | ICD-10-CM

## 2014-05-24 DIAGNOSIS — J849 Interstitial pulmonary disease, unspecified: Secondary | ICD-10-CM

## 2014-05-24 DIAGNOSIS — J8410 Pulmonary fibrosis, unspecified: Secondary | ICD-10-CM

## 2014-05-24 NOTE — Progress Notes (Signed)
[address]       [place] [address]             [phone]                    [name] [facility] Medical Record #[id number] Date of Birth: January 15, 1966  Referring: Julie Helling, MD Primary Care: Julie Headings, MD  Chief Complaint:    Chief Complaint  Patient presents with  . Follow-up    to discuss results of HI RESOLUTION CT CHEST    History of Present Illness:    [name] 48 y.o. female is seen in the office  today for interstitial lung disease and biopsy. The patient has a history of fibromyalgia and chronic narcotic use she is disabled because of her pulmonary problems m and back problems. She has not tolerated taking prednisone in the past. She is referred by [name] because of migratory pulmonary infiltrates and to consider lung biopsy. Migratory pulmonary infiltrates were present on CT scan in March of 2015, and again present on CT of the chest in May. When I initially saw the patient I discussed with her not smoking, and told her there was no point in doing a lung biopsy if she was getting continued to smoke. Since her first visit here she is progressively cut down on her cigarette use to recently she is no longer smoking. She does use "vapor" nicotine. Overall her respiratory status has improved, cough has improved, shortness of breath has improved. Before proceeding with lung biopsy a repeat CT scan was performed, the patient returns to the office today to review the results of the scan done yesterday.   Current Activity/ Functional Status:  Patient is independent with mobility/ambulation, transfers, ADL's, IADL's.   Zubrod Score: At the time of surgery this patient's most appropriate activity status/level should be described as: []     0    Normal activity, no symptoms [x]     1    Restricted in physical strenuous activity but ambulatory, able to do out light work []     2    Ambulatory and capable of self care, unable to do work  activities, up and about               >50 % of waking hours                              []     3    Only limited self care, in bed greater than 50% of waking hours []     4    Completely disabled, no self care, confined to bed or chair []     5    Moribund   Past Medical History  Diagnosis Date  . Abscess     rectal  . Depression   . Headache(784.0)   . Fibromyalgia   . Ovarian cyst   . Endometriosis   . Back pain     Past Surgical History  Procedure Laterality Date  . Appendectomy  1993  . Abdominal hysterectomy  1993  . Cholecystectomy    . Breast surgery  2008    left breast lumpectomy  . Examination under anesthesia  02/15/2012    Procedure: EXAM UNDER ANESTHESIA;  Surgeon: Julie Fus A. Cornett, MD;  Location: [facility];  Service: General;  Laterality: Left;  Excision of perineal cyst  . Video bronchoscopy Bilateral  02/15/2014    Procedure: VIDEO BRONCHOSCOPY WITH FLUORO;  Surgeon: Julie Helling, MD;  Location: [facility];  Service: Cardiopulmonary;  Laterality: Bilateral;    Family History  Problem Relation Age of Onset  . Cancer Father     lung  . COPD Father   . Early death Father   . Other Neg Hx   . Hypertension Sister   . Cancer Maternal Aunt     LUNG CANCER    History   Social History  . Marital Status: Married    Spouse Name: N/A    Number of Children: N/A  . Years of Education: N/A   Occupational History  .  disabled    Social History Main Topics  . Smoking status: Current Every Day Smoker -- 1.00 packs/day for 30 years    Types: Cigarettes  . Smokeless tobacco: Never Used  . Alcohol Use: No  . Drug Use: No  . Sexual Activity: Yes    Birth Control/ Protection: Surgical      History  Smoking status  . Current Every Day Smoker -- 1.00 packs/day for 30 years  . Types: Cigarettes  Smokeless tobacco  . Never Used    History  Alcohol Use No     Allergies  Allergen Reactions  . Cortisone Acetate Shortness Of Breath and  Other (See Comments)    Makes patient feel as if she is choking.  . Vicodin [Hydrocodone-Acetaminophen] Itching    Current Outpatient Prescriptions  Medication Sig Dispense Refill  . albuterol (PROVENTIL HFA;VENTOLIN HFA) 108 (90 BASE) MCG/ACT inhaler Inhale 2 puffs into the lungs every 6 (six) hours as needed for wheezing or shortness of breath.       . ALPRAZolam (XANAX) 1 MG tablet Take 1 mg by mouth 4 (four) times daily.       Marland Kitchen amphetamine-dextroamphetamine (ADDERALL XR) 15 MG 24 hr capsule Take 15 mg by mouth every morning.      Marland Kitchen buPROPion (ZYBAN) 150 MG 12 hr tablet Take 1 tablet (150 mg total) by mouth 2 (two) times daily.  60 tablet  3  . FANAPT 6 MG TABS Take 6 mg by mouth 2 (two) times daily.       Marland Kitchen FLUoxetine (PROZAC) 40 MG capsule Take 80 mg by mouth at bedtime.      . lamoTRIgine (LAMICTAL) 200 MG tablet Take 200 mg by mouth 2 (two) times daily.       . OPANA ER, CRUSH RESISTANT, 10 MG T12A 12 hr tablet Take 1 tablet by mouth 2 (two) times daily.      Marland Kitchen oxybutynin (DITROPAN) 5 MG tablet Take 5 mg by mouth 2 (two) times daily.       Marland Kitchen oxyCODONE-acetaminophen (PERCOCET) 10-325 MG per tablet Take 1 tablet by mouth every 6 (six) hours as needed for pain.      . pregabalin (LYRICA) 75 MG capsule Take 75 mg by mouth 3 (three) times daily.      . promethazine (PHENERGAN) 25 MG tablet Take 25 mg by mouth every 6 (six) hours as needed for nausea or vomiting (migraine).      . rizatriptan (MAXALT) 10 MG tablet Take 20 mg by mouth daily as needed for migraine. May repeat in 2 hours if needed      . tiotropium (SPIRIVA) 18 MCG inhalation capsule Place 18 mcg into inhaler and inhale daily.      Marland Kitchen tiZANidine (ZANAFLEX) 4 MG tablet Take 4 mg by mouth every 6 (six)  hours as needed for muscle spasms.      Marland Kitchen zolpidem (AMBIEN) 10 MG tablet Take 10 mg by mouth at bedtime.        No current facility-administered medications for this visit.     Review of Systems:     Cardiac Review of  Systems: Y or N  Chest Pain [  n  ]  Resting SOB [ y  ] Exertional SOB  [ y ]  Orthopnea [  n]   Pedal Edema [ n  ]    Palpitations [n  ] Syncope  Milo.Brash  ]   Presyncope Cove.Etienne   ]  General Review of Systems: [Y] = yes [  ]=no Constitional: recent weight change [n  ];  Wt loss over the last 3 months [   ] anorexia [  ]; fatigue [ y ]; nausea [  ]; night sweats [  ]; fever [  ]; or chills [  ];          Dental: poor dentition[  ]; Last Dentist visit:   Eye : blurred vision [  ]; diplopia [   ]; vision changes [  ];  Amaurosis fugax[  ]; Resp: cough [  ];  wheezing[y  ];  hemoptysis[n  ]; shortness of breath[ y ]; paroxysmal nocturnal dyspnea[  ]; dyspnea on exertion[y  ]; or orthopnea[  ];  GI:  gallstones[  ], vomiting[  ];  dysphagia[  ]; melena[  ];  hematochezia [  ]; heartburn[  ];   Hx of  Colonoscopy[  ]; GU: kidney stones [  ]; hematuria[  ];   dysuria [  ];  nocturia[  ];  history of     obstruction [  ]; urinary frequency [  ]             Skin: rash, swelling[  ];, hair loss[  ];  peripheral edema[  ];  or itching[  ]; Musculosketetal: myalgias[  ];  joint swelling[  ];  joint erythema[  ];  joint pain[  ];  back pain[  ];  Heme/Lymph: bruising[  ];  bleeding[  ];  anemia[  ];  Neuro: TIA[  ];  headaches[  ];  stroke[  ];  vertigo[  ];  seizures[  ];   paresthesias[  ];  difficulty walking[  ];  Psych:depression[  ]; anxiety[  ];  Endocrine: diabetes[  ];  thyroid dysfunction[  ];  Immunizations: Flu up to date [ ? ]; Pneumococcal up to date [?  ];  Other: Patient's review of systems is positive for loss of appetite decreased energy weight loss chest pain with tightness at rest shortness of breath on exertion shortness of breath at rest dizzy spells pain with cough and deep breathing bronchitis wheezing constipation reflux difficulty swallowing blood in her urine kidney stones varicose Veins headaches chronic pain muscle pain anxiety depression nervousness and easy bruisability  Physical  Exam: BP 104/61  Pulse 56  Resp 16  Ht 5\' 3"  (1.6 m)  Wt 180 lb (81.647 kg)  BMI 31.89 kg/m2  SpO2 94%  PHYSICAL EXAMINATION:  General appearance: alert, cooperative, appears older than stated age and fatigued Neurologic: intact Heart: regular rate and rhythm, S1, S2 normal, no murmur, click, rub or gallop Lungs: diminished breath sounds bilaterally Abdomen: soft, non-tender; bowel sounds normal; no masses,  no organomegaly Extremities: extremities normal, atraumatic, no cyanosis or edema and Homans sign is negative, no sign of DVT Patient has  no carotid bruits, no cervical or supraclavicular adenopathy Patient has no wheezing on exam today  Diagnostic Studies & Laboratory data:     Recent Radiology Findings:   Ct Chest Wo Contrast  05/22/2014   CLINICAL DATA:  Evaluate for interstitial lung disease.  EXAM: CT CHEST WITHOUT CONTRAST  TECHNIQUE: Multidetector CT imaging of the chest was performed following the standard protocol without IV contrast.  COMPARISON:  Chest CT 03/20/2014.  FINDINGS: Mediastinum: Heart size is normal. There is no significant pericardial fluid, thickening or pericardial calcification. No pathologically enlarged mediastinal or hilar lymph nodes. Please note that accurate exclusion of hilar adenopathy is limited on noncontrast CT scans. Esophagus is unremarkable in appearance.  Lungs/Pleura: Mild centrilobular and paraseptal emphysema. The majority of the patchy areas of ground-glass attenuation seen on prior examinations have resolved the lungs bilaterally. There are a few residual small foci of ground-glass attenuation best demonstrated in the left upper lobe on images 13 and 16 of series 4, and in the periphery of the right upper lobe on image 17 of series 4. High-resolution images demonstrate no areas of significant subpleural reticulation, parenchymal banding, traction bronchiectasis or honeycombing. Expiratory images do demonstrate scattered areas of air trapping  throughout the lungs bilaterally, indicative of small airways disease. No suspicious appearing pulmonary nodules or masses are otherwise noted. No acute consolidative airspace disease. No pleural effusions.  Upper Abdomen: Status post cholecystectomy.  Musculoskeletal: There are no aggressive appearing lytic or blastic lesions noted in the visualized portions of the skeleton.  IMPRESSION: 1. Overall there has been significant improvement in the appearance of the lungs with near complete resolution of the previously noted patchy areas of ground-glass attenuation in the lungs bilaterally seen on prior studies. Given the reported decrease in smoking from the patient, this may indicate a positive response to withdrawal of tobacco smoke, and could indicate resolving DIP (desquamative interstitial pneumonitis). Continued counseling to encouraged complete and permanent smoking cessation is recommended. 2. Air trapping in the lungs bilaterally indicative of small airways disease. 3. Mild centrilobular and paraseptal emphysema.   Electronically Signed   By: Trudie Reed M.D.   On: 05/22/2014 14:24   Mr Lumbar Spine Wo Contrast  05/09/2014   CLINICAL DATA:  Fall 09/20/2013. Increasing back pain over the last 3 months. LEFT-sided low back pain.  EXAM: MRI LUMBAR SPINE WITHOUT CONTRAST  TECHNIQUE: Multiplanar, multisequence MR imaging of the lumbar spine was performed. No intravenous contrast was administered.  COMPARISON:  10/16/2013.  FINDINGS: Segmentation: Numbering used on prior exam is preserved.  Alignment:  Normal.  Patient tilted to the LEFT in the scanner.  Vertebrae:  Normal vertebral body height and marrow signal.  Conus medullaris: Normal at L2.  Paraspinal tissues: Normal.  Disc levels:  T11-T12 through L1-L2 discs appear normal. Mild disc desiccation is present at L2-L3 through L4-L5.  L2-L3: Tiny LEFT lateral annular tear and shallow protrusion unchanged. No stenosis.  L3-L4: LEFT lateral annular tear.  Mild disc desiccation. Shallow LEFT foraminal and lateral protrusion.  L4-L5: Minimal disc bulging and degeneration. Schmorl's node in the superior L5 endplate on the RIGHT. Mild facet hypertrophy. No stenosis.  L5-S1:  Negative.  IMPRESSION: Mild lumbar spondylosis, with left-sided lateral annular tears at L2-L3 and L3-L4. No interval change compared to prior.   Electronically Signed   By: Andreas Newport M.D.   On: 05/09/2014 15:38  Mr Cervical Spine Wo Contrast  03/22/2014   CLINICAL DATA:  Neck and back pain since falling November 2014.  EXAM: MRI CERVICAL SPINE WITHOUT CONTRAST  TECHNIQUE: Multiplanar, multisequence MR imaging of the cervical spine was performed. No intravenous contrast was administered.  COMPARISON:  01/31/2010 CT.  11/18/2005.  FINDINGS: Patient was moving throughout the exam. Sequences repeated. Images remained motion degraded.  Right cerebellar tonsils slightly low lying but within the range of normal limits. Visualized intracranial structures unremarkable.  No focal cervical cord signal abnormality.  Visualized paravertebral structures unremarkable. Both vertebral arteries are patent.  C2-3: Minimal left foraminal narrowing secondary to uncinate hypertrophy.  C3-4:  Minimal bulge/spur greater to left.  Minimal left-sided  C4-5:  Minimal bulge.  C5-6: Broad-based disc osteophyte complex. Mild cord contact with minimal flattening. Minimal right-sided with mild left-sided uncinate hypertrophy and foraminal narrowing.  C6-7: Shallow disc osteophyte with mild spinal stenosis without cord flattening. Minimal left foraminal narrowing.  C7-T1: Negative.  IMPRESSION: Motion degraded examination reveals progressive degenerative changes at the C5-6 level as detailed above.   Electronically Signed   By: Bridgett Larsson M.D.   On: 03/22/2014 00:01   Ct Chest High Resolution  03/20/2014   CLINICAL DATA:  Followup interstitial lung disease.  EXAM: CT CHEST WITHOUT CONTRAST  TECHNIQUE: Multidetector CT  imaging of the chest was performed following the standard protocol without intravenous contrast. High resolution imaging of the lungs, as well as inspiratory and expiratory imaging, was performed.  COMPARISON:  01/18/2014.  MR thoracic spine 06/27/2006.  FINDINGS: No pathologically enlarged mediastinal lymph nodes. Hilar regions are difficult to definitively evaluate without IV contrast but appear grossly unremarkable. No axillary adenopathy. Heart size normal. No pericardial effusion.  There is persistent, but slightly improved, patchy bilateral ground-glass involving all lobes of both lungs, with some areas of new involvement bilaterally. No nodularity, traction bronchiectasis/ bronchiolectasis, subpleural reticulation, architectural distortion or honeycombing. No air trapping on inspiratory and expiratory imaging. No pleural fluid. Airway is unremarkable.  Incidental imaging of the upper abdomen shows the visualized portions of the liver, adrenal glands, kidneys, spleen, pancreas and stomach to be grossly unremarkable. No upper abdominal adenopathy. A fluffy area of sclerosis in the T7 vertebral body is unchanged from 01/18/2014 and also described on MR 06/27/2006, indicative of a benign lesion. Degenerative changes are seen in the spine.  IMPRESSION: Persistent patchy ground-glass in the lungs bilaterally, with some areas improvement and other areas of new involvement. No fibrotic features. 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   Electronically Signed   By: Leanna Battles M.D.   On: 03/20/2014 16:33      Recent Lab Findings: Lab Results  Component Value Date   WBC 10.4 01/11/2014   HGB 11.7* 01/11/2014   HCT 34.7* 01/11/2014   PLT 257.0 01/11/2014   GLUCOSE 106* 01/11/2014   ALT 15 01/11/2014   AST 14 01/11/2014   NA 138 01/11/2014   K 3.5 01/11/2014   CL 105 01/11/2014   CREATININE 0.9 01/11/2014   BUN 11 01/11/2014   CO2 26 01/11/2014    TSH 2.252 Test methodology is 3rd generation TSH 08/22/2008      Assessment / Plan:   I have again emphasized with the patient the need to completely avoid smoking with her probable DIP (desquamative interstitial pneumonitis.  With the most recent notes CT of the chest done yesterday and almost complete resolution of the migratory pneumonitis I recommended to the patient that we not proceed with lung biopsy at this point, and that she continue her efforts to stay  off cigarettes. I have emphasized to her that the withdrawal of exposure to cigarette smoke has markedly improved her pulmonary process. She has a followup appointment in early August in the pulmonary office. I've not made a return appointment for her to see me but should she have recurrent migratory pneumonitis would be glad to see her back again and reconsider lung biopsy.  Delight OvensEdward B Mikhail Hallenbeck MD      [address] [place] [id number] Office [phone]   Beeper [phone]  05/24/2014 4:14 PM

## 2014-06-17 ENCOUNTER — Encounter: Payer: Self-pay | Admitting: Pulmonary Disease

## 2014-06-17 ENCOUNTER — Ambulatory Visit (INDEPENDENT_AMBULATORY_CARE_PROVIDER_SITE_OTHER): Payer: Medicare Other | Admitting: Pulmonary Disease

## 2014-06-17 VITALS — BP 92/58 | HR 61 | Ht 62.0 in | Wt 187.0 lb

## 2014-06-17 DIAGNOSIS — R5383 Other fatigue: Secondary | ICD-10-CM | POA: Insufficient documentation

## 2014-06-17 DIAGNOSIS — J438 Other emphysema: Secondary | ICD-10-CM

## 2014-06-17 DIAGNOSIS — J439 Emphysema, unspecified: Secondary | ICD-10-CM | POA: Insufficient documentation

## 2014-06-17 DIAGNOSIS — R918 Other nonspecific abnormal finding of lung field: Secondary | ICD-10-CM

## 2014-06-17 DIAGNOSIS — F17200 Nicotine dependence, unspecified, uncomplicated: Secondary | ICD-10-CM

## 2014-06-17 DIAGNOSIS — R5381 Other malaise: Secondary | ICD-10-CM

## 2014-06-17 DIAGNOSIS — J432 Centrilobular emphysema: Secondary | ICD-10-CM

## 2014-06-17 DIAGNOSIS — M7989 Other specified soft tissue disorders: Secondary | ICD-10-CM | POA: Insufficient documentation

## 2014-06-17 DIAGNOSIS — Z720 Tobacco use: Secondary | ICD-10-CM

## 2014-06-17 MED ORDER — UMECLIDINIUM-VILANTEROL 62.5-25 MCG/INH IN AEPB: 1.0000 | INHALATION_SPRAY | Freq: Every day | RESPIRATORY_TRACT | Status: DC

## 2014-06-17 NOTE — Assessment & Plan Note (Signed)
Not improved with spiriva.  Will have her try anoro.

## 2014-06-17 NOTE — Progress Notes (Signed)
Chief Complaint  Patient presents with  . Follow-up    Pt states that SOB hsa been increased recently-- noticeable with heavy exertion. Pt states that Spiriva is not helping to control symptoms. Increased swelling in legs/ankles, fatigue and low BP     History of Present Illness: [name] is a 48 y.o. female smoker with dyspnea with pulmonary infiltrates likely from DIP, and emphysema.  She has been seen by Dr. Servando Snare >> advised to stop smoking first before any surgical intervention.  She is down to 4 cigarettes per day.  Her breathing has been doing better.  Her CT chest from July looked better also.  She still has trouble with dyspnea.  She does not feel like spiriva helps.  She has occasional cough, but not much sputum.  She still has hoarse voice, but this is improved since last visit.  She feels tired and fatigued frequently.  She has noticed more problems with headaches.  She has noticed more trouble with leg swelling.  This gets better when she keeps her legs up.  TESTS: A1AT 01/11/14 >> 154, MM CT chest 01/18/14 >> mosaic GGO b/l Rt > Lt PFT 01/25/14 >> FEV1 2.11 (73%), FEV1% 86, TLC 4.40 (87%), DLCO 70%, no BD PSG 02/08/14 >> AHI 0.4, SaO2 low 90%, PLMI 0.7, sleep onset 0 minutes. Labs 02/08/14 >> Hypersensitivity panel negative, RF < 10, ANA negative, ESR 19  Bronchoscopy 02/15/14 >> CD4:CD8 1:1, Tbx RUL reactive bronchial epithelial cells, BAL RUL histiocytes/neutrophils/reactive epithelial cells, WBC 23 (N16, L3, M81), Quant cx negative CT chest 03/20/14 >> persistent patchy b/l GGO ?DIP 04/12/14 Refer to thoracic surgery CT chest 722/15 >> mild centrilobular and paraseptal emphysema, much improved appearance of GGO, airtrapping  PMHx, PSHx, Medications, Allergies, Fhx, Shx reviewed.  Physical Exam:  General - No distress, hoarse voice ENT - No sinus tenderness, no oral exudate, no LAN Cardiac - s1s2 regular, no murmur Chest - no wheeze Back - No focal  tenderness Abd - Soft, non-tender Ext - No edema Neuro - Normal strength Skin - No rashes Psych - normal mood, and behavior   Assessment/Plan:  Chesley Mires, MD [facility] Pager:  [phone]

## 2014-06-17 NOTE — Assessment & Plan Note (Signed)
Likely related DIP >> improved clinically and on CT chest with decreased smoking.

## 2014-06-17 NOTE — Assessment & Plan Note (Signed)
Encouraged her to continue with her smoking cessation efforts. 

## 2014-06-17 NOTE — Assessment & Plan Note (Signed)
Will check Echo to assess RV systolic fx and PA pressures.

## 2014-06-17 NOTE — Patient Instructions (Signed)
Anoro one puff daily Will schedule Echo and call with results Follow up in 4 months

## 2014-06-17 NOTE — Assessment & Plan Note (Signed)
Likely related to fibromyalgia and her meds.  She had negative sleep study recently.  Advised her to d/w her PCP.

## 2014-06-18 ENCOUNTER — Other Ambulatory Visit: Payer: Self-pay | Admitting: Pulmonary Disease

## 2014-06-19 ENCOUNTER — Emergency Department (HOSPITAL_COMMUNITY): Payer: Medicare Other

## 2014-06-19 ENCOUNTER — Emergency Department (HOSPITAL_COMMUNITY)
Admission: EM | Admit: 2014-06-19 | Discharge: 2014-06-19 | Disposition: A | Discharge status: Home / Self Care | Payer: Medicare Other | Attending: Emergency Medicine | Admitting: Emergency Medicine

## 2014-06-19 ENCOUNTER — Encounter (HOSPITAL_COMMUNITY): Payer: Self-pay | Admitting: Emergency Medicine

## 2014-06-19 DIAGNOSIS — IMO0001 Reserved for inherently not codable concepts without codable children: Secondary | ICD-10-CM | POA: Insufficient documentation

## 2014-06-19 DIAGNOSIS — F3289 Other specified depressive episodes: Secondary | ICD-10-CM | POA: Insufficient documentation

## 2014-06-19 DIAGNOSIS — S0993XA Unspecified injury of face, initial encounter: Secondary | ICD-10-CM | POA: Diagnosis not present

## 2014-06-19 DIAGNOSIS — F17200 Nicotine dependence, unspecified, uncomplicated: Secondary | ICD-10-CM | POA: Diagnosis not present

## 2014-06-19 DIAGNOSIS — IMO0002 Reserved for concepts with insufficient information to code with codable children: Secondary | ICD-10-CM | POA: Insufficient documentation

## 2014-06-19 DIAGNOSIS — Z8709 Personal history of other diseases of the respiratory system: Secondary | ICD-10-CM | POA: Insufficient documentation

## 2014-06-19 DIAGNOSIS — Z872 Personal history of diseases of the skin and subcutaneous tissue: Secondary | ICD-10-CM | POA: Diagnosis not present

## 2014-06-19 DIAGNOSIS — F329 Major depressive disorder, single episode, unspecified: Secondary | ICD-10-CM | POA: Diagnosis not present

## 2014-06-19 DIAGNOSIS — Y92410 Unspecified street and highway as the place of occurrence of the external cause: Secondary | ICD-10-CM | POA: Diagnosis not present

## 2014-06-19 DIAGNOSIS — S199XXA Unspecified injury of neck, initial encounter: Secondary | ICD-10-CM

## 2014-06-19 DIAGNOSIS — S0990XA Unspecified injury of head, initial encounter: Secondary | ICD-10-CM | POA: Diagnosis not present

## 2014-06-19 DIAGNOSIS — Z8742 Personal history of other diseases of the female genital tract: Secondary | ICD-10-CM | POA: Insufficient documentation

## 2014-06-19 DIAGNOSIS — Z79899 Other long term (current) drug therapy: Secondary | ICD-10-CM | POA: Diagnosis not present

## 2014-06-19 DIAGNOSIS — Y9389 Activity, other specified: Secondary | ICD-10-CM | POA: Insufficient documentation

## 2014-06-19 LAB — COMPREHENSIVE METABOLIC PANEL
ALT: 10 U/L (ref 0–35)
AST: 16 U/L (ref 0–37)
Albumin: 4 g/dL (ref 3.5–5.2)
Alkaline Phosphatase: 28 U/L — ABNORMAL LOW (ref 39–117)
Anion gap: 15 (ref 5–15)
BUN: 13 mg/dL (ref 6–23)
CO2: 23 mEq/L (ref 19–32)
Calcium: 9.8 mg/dL (ref 8.4–10.5)
Chloride: 104 mEq/L (ref 96–112)
Creatinine, Ser: 0.94 mg/dL (ref 0.50–1.10)
GFR calc Af Amer: 82 mL/min — ABNORMAL LOW (ref >90)
GFR calc non Af Amer: 71 mL/min — ABNORMAL LOW (ref >90)
Glucose, Bld: 87 mg/dL (ref 70–99)
Potassium: 4.2 mEq/L (ref 3.7–5.3)
Sodium: 142 mEq/L (ref 137–147)
Total Bilirubin: <0.2 mg/dL — ABNORMAL LOW (ref 0.3–1.2)
Total Protein: 6.8 g/dL (ref 6.0–8.3)

## 2014-06-19 LAB — ETHANOL: Alcohol, Ethyl (B): <11 mg/dL (ref 0–11)

## 2014-06-19 LAB — CBC WITH DIFFERENTIAL/PLATELET
Basophils Absolute: 0 10*3/uL (ref 0.0–0.1)
Basophils Relative: 0 % (ref 0–1)
Eosinophils Absolute: 0.2 10*3/uL (ref 0.0–0.7)
Eosinophils Relative: 3 % (ref 0–5)
HCT: 35.9 % — ABNORMAL LOW (ref 36.0–46.0)
Hemoglobin: 11.9 g/dL — ABNORMAL LOW (ref 12.0–15.0)
Lymphocytes Relative: 37 % (ref 12–46)
Lymphs Abs: 2.5 10*3/uL (ref 0.7–4.0)
MCH: 28.7 pg (ref 26.0–34.0)
MCHC: 33.1 g/dL (ref 30.0–36.0)
MCV: 86.7 fL (ref 78.0–100.0)
Monocytes Absolute: 0.5 10*3/uL (ref 0.1–1.0)
Monocytes Relative: 7 % (ref 3–12)
Neutro Abs: 3.6 10*3/uL (ref 1.7–7.7)
Neutrophils Relative %: 53 % (ref 43–77)
Platelets: 229 10*3/uL (ref 150–400)
RBC: 4.14 MIL/uL (ref 3.87–5.11)
RDW: 17.8 % — ABNORMAL HIGH (ref 11.5–15.5)
WBC: 6.8 10*3/uL (ref 4.0–10.5)

## 2014-06-19 LAB — RAPID URINE DRUG SCREEN, HOSP PERFORMED
Amphetamines: NOT DETECTED
Barbiturates: NOT DETECTED
Benzodiazepines: NOT DETECTED
Cocaine: NOT DETECTED
Opiates: NOT DETECTED
Tetrahydrocannabinol: NOT DETECTED

## 2014-06-19 MED ORDER — IOHEXOL 300 MG/ML  SOLN
100.0000 mL | Freq: Once | INTRAMUSCULAR | Status: AC | PRN
  Administered 2014-06-19: 100 mL via INTRAVENOUS

## 2014-06-19 MED ORDER — OXYCODONE-ACETAMINOPHEN 5-325 MG PO TABS
1.0000 | ORAL_TABLET | Freq: Once | ORAL | Status: AC
  Administered 2014-06-19: 1 via ORAL
  Filled 2014-06-19: qty 1

## 2014-06-19 MED ORDER — NAPROXEN 250 MG PO TABS
375.0000 mg | ORAL_TABLET | Freq: Once | ORAL | Status: AC
  Administered 2014-06-19: 375 mg via ORAL
  Filled 2014-06-19: qty 2

## 2014-06-19 MED ORDER — ONDANSETRON 4 MG PO TBDP
4.0000 mg | ORAL_TABLET | Freq: Once | ORAL | Status: AC
  Administered 2014-06-19: 4 mg via ORAL
  Filled 2014-06-19: qty 1

## 2014-06-19 MED ORDER — OXYCODONE-ACETAMINOPHEN 5-325 MG PO TABS: 1.0000 | ORAL_TABLET | Freq: Four times a day (QID) | ORAL | Status: DC | PRN

## 2014-06-19 MED ORDER — NAPROXEN 375 MG PO TABS: 375.0000 mg | ORAL_TABLET | Freq: Two times a day (BID) | ORAL | Status: DC

## 2014-06-19 MED ORDER — CYCLOBENZAPRINE HCL 10 MG PO TABS: 10.0000 mg | ORAL_TABLET | Freq: Two times a day (BID) | ORAL | Status: DC | PRN
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
## 2014-06-19 NOTE — ED Notes (Signed)
[facility] notified pt has IV access
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
## 2014-06-19 NOTE — Discharge Instructions (Signed)

## 2014-06-20 NOTE — Telephone Encounter (Signed)
Last refilled 02/28/14 x 3 refills Please advise Dr. Craige CottaSood thanks

## 2014-06-21 NOTE — ED Provider Notes (Signed)
Medical screening examination/treatment/procedure(s) were performed by non-physician practitioner and as supervising physician I was immediately available for consultation/collaboration.   EKG Interpretation None        Mirian MoMatthew Gentry, MD 06/21/14 [phone]

## 2014-06-26 ENCOUNTER — Other Ambulatory Visit (HOSPITAL_COMMUNITY): Payer: Medicare Other

## 2014-07-10 ENCOUNTER — Other Ambulatory Visit (HOSPITAL_COMMUNITY): Payer: Medicare Other

## 2014-07-12 ENCOUNTER — Other Ambulatory Visit (HOSPITAL_COMMUNITY): Payer: Medicare Other

## 2014-07-19 ENCOUNTER — Other Ambulatory Visit (HOSPITAL_COMMUNITY): Payer: Medicare Other

## 2014-07-22 ENCOUNTER — Encounter (HOSPITAL_COMMUNITY): Payer: Self-pay | Admitting: Pulmonary Disease

## 2014-07-29 ENCOUNTER — Ambulatory Visit (HOSPITAL_COMMUNITY): Payer: Medicare Other | Attending: Internal Medicine | Admitting: Radiology

## 2014-07-29 DIAGNOSIS — E669 Obesity, unspecified: Secondary | ICD-10-CM | POA: Diagnosis not present

## 2014-07-29 DIAGNOSIS — J439 Emphysema, unspecified: Secondary | ICD-10-CM

## 2014-07-29 DIAGNOSIS — R0602 Shortness of breath: Secondary | ICD-10-CM

## 2014-07-29 DIAGNOSIS — F17200 Nicotine dependence, unspecified, uncomplicated: Secondary | ICD-10-CM | POA: Insufficient documentation

## 2014-07-29 DIAGNOSIS — M7989 Other specified soft tissue disorders: Secondary | ICD-10-CM

## 2014-07-29 DIAGNOSIS — J432 Centrilobular emphysema: Secondary | ICD-10-CM

## 2014-07-29 NOTE — Progress Notes (Signed)
Echocardiogram performed.  

## 2014-07-30 ENCOUNTER — Telehealth: Payer: Self-pay | Admitting: Pulmonary Disease

## 2014-07-30 NOTE — Telephone Encounter (Signed)
Echo 07/29/14 >> EF 55 to 60%, grade 1 diastolic dysfx  Will have my nurse inform pt that Echo looked okay.  No change to current tx plan.

## 2014-07-30 NOTE — Telephone Encounter (Signed)
Results have been explained to patient, pt expressed understanding. Nothing further needed.  

## 2014-09-02 ENCOUNTER — Encounter (HOSPITAL_COMMUNITY): Payer: Self-pay | Admitting: Emergency Medicine

## 2014-10-31 ENCOUNTER — Encounter: Payer: Self-pay | Admitting: Pulmonary Disease

## 2014-10-31 ENCOUNTER — Ambulatory Visit (INDEPENDENT_AMBULATORY_CARE_PROVIDER_SITE_OTHER): Payer: Medicare Other | Admitting: Pulmonary Disease

## 2014-10-31 VITALS — BP 118/80 | HR 74 | Ht 62.0 in | Wt 204.8 lb

## 2014-10-31 DIAGNOSIS — R918 Other nonspecific abnormal finding of lung field: Secondary | ICD-10-CM

## 2014-10-31 DIAGNOSIS — J432 Centrilobular emphysema: Secondary | ICD-10-CM

## 2014-10-31 DIAGNOSIS — R0600 Dyspnea, unspecified: Secondary | ICD-10-CM

## 2014-10-31 DIAGNOSIS — Z720 Tobacco use: Secondary | ICD-10-CM

## 2014-10-31 MED ORDER — FORMOTEROL FUMARATE 12 MCG IN CAPS: 12.0000 ug | ORAL_CAPSULE | Freq: Two times a day (BID) | RESPIRATORY_TRACT | Status: DC

## 2014-10-31 NOTE — Patient Instructions (Signed)
Foradil one puff twice per day Follow up in 6 months
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
## 2014-11-03 ENCOUNTER — Encounter (HOSPITAL_COMMUNITY): Payer: Self-pay | Admitting: Emergency Medicine

## 2014-11-03 ENCOUNTER — Emergency Department (HOSPITAL_COMMUNITY)
Admission: EM | Admit: 2014-11-03 | Discharge: 2014-11-03 | Disposition: A | Discharge status: Home / Self Care | Payer: Medicare Other | Attending: Emergency Medicine | Admitting: Emergency Medicine

## 2014-11-03 ENCOUNTER — Emergency Department (HOSPITAL_COMMUNITY): Payer: Medicare Other

## 2014-11-03 DIAGNOSIS — Z791 Long term (current) use of non-steroidal anti-inflammatories (NSAID): Secondary | ICD-10-CM | POA: Diagnosis not present

## 2014-11-03 DIAGNOSIS — M79646 Pain in unspecified finger(s): Secondary | ICD-10-CM

## 2014-11-03 DIAGNOSIS — S62501A Fracture of unspecified phalanx of right thumb, initial encounter for closed fracture: Secondary | ICD-10-CM

## 2014-11-03 DIAGNOSIS — Z79899 Other long term (current) drug therapy: Secondary | ICD-10-CM | POA: Diagnosis not present

## 2014-11-03 DIAGNOSIS — Z872 Personal history of diseases of the skin and subcutaneous tissue: Secondary | ICD-10-CM | POA: Diagnosis not present

## 2014-11-03 DIAGNOSIS — Z720 Tobacco use: Secondary | ICD-10-CM | POA: Diagnosis not present

## 2014-11-03 DIAGNOSIS — S62521A Displaced fracture of distal phalanx of right thumb, initial encounter for closed fracture: Secondary | ICD-10-CM | POA: Diagnosis not present

## 2014-11-03 DIAGNOSIS — Z8742 Personal history of other diseases of the female genital tract: Secondary | ICD-10-CM | POA: Diagnosis not present

## 2014-11-03 DIAGNOSIS — Y929 Unspecified place or not applicable: Secondary | ICD-10-CM | POA: Insufficient documentation

## 2014-11-03 DIAGNOSIS — F329 Major depressive disorder, single episode, unspecified: Secondary | ICD-10-CM | POA: Insufficient documentation

## 2014-11-03 DIAGNOSIS — W19XXXA Unspecified fall, initial encounter: Secondary | ICD-10-CM | POA: Insufficient documentation

## 2014-11-03 DIAGNOSIS — Y998 Other external cause status: Secondary | ICD-10-CM | POA: Diagnosis not present

## 2014-11-03 DIAGNOSIS — Y939 Activity, unspecified: Secondary | ICD-10-CM | POA: Insufficient documentation

## 2014-11-03 DIAGNOSIS — Z8709 Personal history of other diseases of the respiratory system: Secondary | ICD-10-CM | POA: Diagnosis not present

## 2014-11-03 DIAGNOSIS — S60931A Unspecified superficial injury of right thumb, initial encounter: Secondary | ICD-10-CM | POA: Diagnosis present

## 2014-11-03 DIAGNOSIS — M797 Fibromyalgia: Secondary | ICD-10-CM | POA: Diagnosis not present

## 2014-11-03 MED ORDER — IBUPROFEN 800 MG PO TABS: 800.0000 mg | ORAL_TABLET | Freq: Three times a day (TID) | ORAL | Status: DC | PRN

## 2014-11-03 MED ORDER — OXYCODONE-ACETAMINOPHEN 5-325 MG PO TABS
1.0000 | ORAL_TABLET | Freq: Once | ORAL | Status: AC
  Administered 2014-11-03: 1 via ORAL
  Filled 2014-11-03: qty 1

## 2014-11-03 MED ORDER — OXYCODONE-ACETAMINOPHEN 5-325 MG PO TABS: 1.0000 | ORAL_TABLET | Freq: Four times a day (QID) | ORAL | Status: DC | PRN

## 2014-11-03 MED ORDER — DIPHENHYDRAMINE HCL 25 MG PO CAPS
25.0000 mg | ORAL_CAPSULE | Freq: Once | ORAL | Status: AC
  Administered 2014-11-03: 25 mg via ORAL
  Filled 2014-11-03: qty 1

## 2014-11-03 NOTE — ED Notes (Signed)
Declined W/C at D/C and was escorted to lobby by RN. 

## 2014-11-03 NOTE — Discharge Instructions (Signed)
Follow-up with the orthopedist provided for your orthopedist.  Return here as needed.  Ice and elevate the area

## 2014-11-03 NOTE — ED Provider Notes (Signed)
CSN: [id number]     Arrival date & time 11/03/14  1121 History   This chart was scribed for non-physician practitioner, Ebbie Ridge, PA-C, working with Gwyneth Sprout, MD, by Ronney Lion, ED Scribe. This patient was seen in room TR07C/TR07C and the patient's care was started at 11:39 AM.    Chief Complaint  Patient presents with  . Hand Pain   The history is provided by the patient. No language interpreter was used.     HPI Comments: [name] is a 49 y.o. female who presents to the Emergency Department complaining of worsening, constant right thumb pain with shooting pains radiating up her elbow that began 3 days ago after patient slipped in her bathtub and landed on her right thumb. She denies injury to her elbow.  Past Medical History  Diagnosis Date  . Abscess     rectal  . Depression   . Headache(784.0)   . Fibromyalgia   . Ovarian cyst   . Endometriosis   . Back pain   . Pulmonary infiltrates   . ILD (interstitial lung disease)   . Dyspnea    Past Surgical History  Procedure Laterality Date  . Appendectomy  1993  . Abdominal hysterectomy  1993  . Cholecystectomy    . Breast surgery  2008    left breast lumpectomy  . Examination under anesthesia  02/15/2012    Procedure: EXAM UNDER ANESTHESIA;  Surgeon: Maisie Fus A. Cornett, MD;  [facility]:  [facility];  Service: General;  Laterality: Left;  Excision of perineal cyst  . Video bronchoscopy Bilateral 02/15/2014    Procedure: VIDEO BRONCHOSCOPY WITH FLUORO;  Surgeon: Coralyn Helling, MD;  Location: [facility];  Service: Cardiopulmonary;  Laterality: Bilateral;   Family History  Problem Relation Age of Onset  . Cancer Father     lung  . COPD Father   . Early death Father   . Other Neg Hx   . Hypertension Sister   . Cancer Maternal Aunt     LUNG CANCER   History  Substance Use Topics  . Smoking status: Current Every Day Smoker -- 0.50 packs/day for 30 years    Types: Cigarettes  . Smokeless tobacco:  Never Used  . Alcohol Use: No   OB History    Gravida Para Term Preterm AB TAB SAB Ectopic Multiple Living   0 0 0 0 0 0 2     Review of Systems  Musculoskeletal: Positive for myalgias.  All other systems reviewed and are negative.     Allergies  Cortisone acetate and Vicodin  Home Medications   Prior to Admission medications   Medication Sig Start Date End Date Taking? Authorizing Provider  albuterol (PROVENTIL HFA;VENTOLIN HFA) 108 (90 BASE) MCG/ACT inhaler Inhale 2 puffs into the lungs every 6 (six) hours as needed for wheezing or shortness of breath.     Historical Provider, MD  ALPRAZolam Prudy Feeler) 1 MG tablet Take 1 mg by mouth 4 (four) times daily.     Historical Provider, MD  amphetamine-dextroamphetamine (ADDERALL XR) 15 MG 24 hr capsule Take 15 mg by mouth every morning.    Historical Provider, MD  FANAPT 6 MG TABS Take 6 mg by mouth 3 (three) times daily.  02/26/14   Historical Provider, MD  FLUoxetine (PROZAC) 40 MG capsule Take 40 mg by mouth 2 (two) times daily.     Historical Provider, MD  formoterol (FORADIL) 12 MCG capsule for inhaler Place 1 capsule (12 mcg  total) into inhaler and inhale 2 (two) times daily. 10/31/14   Coralyn Helling, MD  lamoTRIgine (LAMICTAL) 200 MG tablet Take 200 mg by mouth 2 (two) times daily.  02/17/14   Historical Provider, MD  naproxen (NAPROSYN) 375 MG tablet Take 1 tablet (375 mg total) by mouth 2 (two) times daily. 06/19/14   Tiffany Irine Seal, PA-C  OPANA ER, CRUSH RESISTANT, 10 MG T12A 12 hr tablet Take 1 tablet by mouth 2 (two) times daily. 02/18/14   Historical Provider, MD  oxyCODONE-acetaminophen (PERCOCET) 10-325 MG per tablet Take 1 tablet by mouth every 6 (six) hours as needed for pain.    Historical Provider, MD  pregabalin (LYRICA) 225 MG capsule Take 225 mg by mouth 2 (two) times daily.    Historical Provider, MD  promethazine (PHENERGAN) 25 MG tablet Take 25 mg by mouth every 6 (six) hours as needed for nausea or vomiting  (migraine).    Historical Provider, MD  rizatriptan (MAXALT) 10 MG tablet Take 20 mg by mouth daily as needed for migraine. May repeat in 2 hours if needed    Historical Provider, MD  tiZANidine (ZANAFLEX) 4 MG tablet Take 4 mg by mouth every 6 (six) hours as needed for muscle spasms.    Historical Provider, MD  zolpidem (AMBIEN) 10 MG tablet Take 10 mg by mouth at bedtime.  02/25/14   Historical Provider, MD   BP 107/60 mmHg  Pulse 75  Temp(Src) 97.7 F (36.5 C) (Oral)  Resp 18  SpO2 97% Physical Exam  Constitutional: She is oriented to person, place, and time. She appears well-developed and well-nourished. No distress.  HENT:  Head: Normocephalic and atraumatic.  Pulmonary/Chest: Effort normal.  Musculoskeletal:       Right hand: She exhibits decreased range of motion and tenderness. She exhibits no bony tenderness, normal two-point discrimination, normal capillary refill, no deformity, no laceration and no swelling. Normal sensation noted. Normal strength noted.       Hands: Neurological: She is alert and oriented to person, place, and time. She exhibits normal muscle tone. Coordination normal.  Skin: Skin is warm and dry.  Psychiatric: She has a normal mood and affect. Her behavior is normal.  Nursing note and vitals reviewed.   ED Course  Procedures (including critical care time)  DIAGNOSTIC STUDIES: Oxygen Saturation is 97% on room air, normal by my interpretation.    COORDINATION OF CARE: 11:40 AM - Discussed treatment plan with pt at bedside which includes thumb XR and ice and pt agreed to plan.   Imaging Review Dg Finger Thumb Right  11/03/2014   CLINICAL DATA:  Status post fall in the tuft and hurt the right thumb this morning.  EXAM: RIGHT THUMB 2+V  COMPARISON:  None.  FINDINGS: There is subtle radiopaque lucency identified at the base of the right first distal phalanx suspicious for fracture. There is no dislocation.  IMPRESSION: Subtle radiolucency identified the base  of the right first distal phalanx suspicious for fracture.   Electronically Signed   By: Sherian Rein M.D.   On: 11/03/2014 12:24   patient will be referred to orthopedics.  Told to return here as needed.  Use ice and elevation on the area   I personally performed the services described in this documentation, which was scribed in my presence. The recorded information has been reviewed and is accurate.   Jamesetta Orleans Hodaya Curto, PA-C 11/03/14 1239  Gwyneth Sprout, MD 11/04/14 1501

## 2014-11-03 NOTE — ED Notes (Signed)
Pt c/o right thumb pain after falling 3 days ago

## 2014-11-03 NOTE — Progress Notes (Signed)
Orthopedic Tech Progress Note Patient Details:  [name] Jul 26, 1966 [id number] Thumb spica applied to [facility]. Application tolerated well.   Ortho Devices Type of Ortho Device: Ace wrap, Thumb spica splint Splint Material: Plaster Ortho Device/Splint Location: RUE Ortho Device/Splint Interventions: Application   Asia R Thompson 11/03/2014, 1:19 PM

## 2014-11-04 ENCOUNTER — Telehealth: Payer: Self-pay | Admitting: Pulmonary Disease

## 2014-11-04 MED ORDER — FORMOTEROL FUMARATE 12 MCG IN CAPS: 12.0000 ug | ORAL_CAPSULE | Freq: Two times a day (BID) | RESPIRATORY_TRACT | Status: DC

## 2014-11-04 MED ORDER — ALBUTEROL SULFATE HFA 108 (90 BASE) MCG/ACT IN AERS: 2.0000 | INHALATION_SPRAY | Freq: Four times a day (QID) | RESPIRATORY_TRACT | Status: DC | PRN

## 2014-11-04 NOTE — Telephone Encounter (Signed)
Called and spoke with pt and she stated that the pharmacy gave her the anoro when she went to pick up her rx.  She stated that she will need the foradil sent in.  This has been done and nothing further is needed.

## 2014-11-05 ENCOUNTER — Telehealth: Payer: Self-pay | Admitting: Pulmonary Disease

## 2014-11-05 NOTE — Telephone Encounter (Signed)
Is there alternative to foradil that she can use? If so, then okay to switch.  Otherwise need to file PA for foradil.

## 2014-11-05 NOTE — Telephone Encounter (Signed)
[facility] pharmacy, was left on hold for over 10 minutes.   Dr. Craige CottaSood please advise if to continue with PA for foradil.

## 2014-11-05 NOTE — Telephone Encounter (Signed)
I called the pharmacy back to get PA # of [phone]. I called and initiated PA. The rep was unable to provide any alternatives. Will await fax to triage fax. Pt is aware. Carron CurieJennifer Sly Parlee, CMA

## 2014-11-07 NOTE — Telephone Encounter (Signed)
Form completed and placed in [facility] look-at to sign. Pt is aware of the process. Carron CurieJennifer Neya Creegan, CMA

## 2014-11-07 NOTE — Telephone Encounter (Signed)
Patient calling asking for the status of PA.

## 2014-11-07 NOTE — Telephone Encounter (Signed)
lmtcb X1 for pt  

## 2014-11-08 NOTE — Telephone Encounter (Signed)
Pt called back; she is aware that [facility] is back in office on Wednesday 11-13-14 and then can fill out paperwork and sign. Once faxed in the avg wait time is 48-72 hours for insurance to make a decision. Will forward to Ashtyn so she is aware of PA needed.

## 2014-11-14 ENCOUNTER — Inpatient Hospital Stay (HOSPITAL_COMMUNITY)
Admission: EM | Admit: 2014-11-14 | Discharge: 2014-11-16 | DRG: 189 | Disposition: A | Discharge status: Home / Self Care | Payer: Medicare Other | Attending: Internal Medicine | Admitting: Internal Medicine

## 2014-11-14 ENCOUNTER — Emergency Department (HOSPITAL_COMMUNITY): Payer: Medicare Other

## 2014-11-14 ENCOUNTER — Encounter (HOSPITAL_COMMUNITY): Payer: Self-pay | Admitting: *Deleted

## 2014-11-14 DIAGNOSIS — Z716 Tobacco abuse counseling: Secondary | ICD-10-CM | POA: Diagnosis present

## 2014-11-14 DIAGNOSIS — M797 Fibromyalgia: Secondary | ICD-10-CM | POA: Diagnosis present

## 2014-11-14 DIAGNOSIS — R112 Nausea with vomiting, unspecified: Secondary | ICD-10-CM

## 2014-11-14 DIAGNOSIS — R0602 Shortness of breath: Secondary | ICD-10-CM

## 2014-11-14 DIAGNOSIS — J9600 Acute respiratory failure, unspecified whether with hypoxia or hypercapnia: Secondary | ICD-10-CM | POA: Diagnosis present

## 2014-11-14 DIAGNOSIS — F319 Bipolar disorder, unspecified: Secondary | ICD-10-CM | POA: Diagnosis present

## 2014-11-14 DIAGNOSIS — R9431 Abnormal electrocardiogram [ECG] [EKG]: Secondary | ICD-10-CM | POA: Diagnosis present

## 2014-11-14 DIAGNOSIS — J9601 Acute respiratory failure with hypoxia: Principal | ICD-10-CM | POA: Diagnosis present

## 2014-11-14 DIAGNOSIS — Z888 Allergy status to other drugs, medicaments and biological substances status: Secondary | ICD-10-CM | POA: Diagnosis not present

## 2014-11-14 DIAGNOSIS — E876 Hypokalemia: Secondary | ICD-10-CM | POA: Diagnosis present

## 2014-11-14 DIAGNOSIS — Z720 Tobacco use: Secondary | ICD-10-CM | POA: Diagnosis not present

## 2014-11-14 DIAGNOSIS — Z8249 Family history of ischemic heart disease and other diseases of the circulatory system: Secondary | ICD-10-CM | POA: Diagnosis not present

## 2014-11-14 DIAGNOSIS — F17210 Nicotine dependence, cigarettes, uncomplicated: Secondary | ICD-10-CM | POA: Diagnosis present

## 2014-11-14 DIAGNOSIS — G8929 Other chronic pain: Secondary | ICD-10-CM | POA: Diagnosis present

## 2014-11-14 DIAGNOSIS — J441 Chronic obstructive pulmonary disease with (acute) exacerbation: Secondary | ICD-10-CM | POA: Diagnosis present

## 2014-11-14 DIAGNOSIS — Z836 Family history of other diseases of the respiratory system: Secondary | ICD-10-CM | POA: Diagnosis not present

## 2014-11-14 DIAGNOSIS — Z801 Family history of malignant neoplasm of trachea, bronchus and lung: Secondary | ICD-10-CM | POA: Diagnosis not present

## 2014-11-14 HISTORY — DX: Anxiety disorder, unspecified: F41.9

## 2014-11-14 HISTORY — DX: Migraine, unspecified, not intractable, without status migrainosus: G43.909

## 2014-11-14 HISTORY — DX: Bipolar disorder, unspecified: F31.9

## 2014-11-14 HISTORY — DX: Other chronic pain: G89.29

## 2014-11-14 HISTORY — DX: Unspecified osteoarthritis, unspecified site: M19.90

## 2014-11-14 HISTORY — DX: Emphysema, unspecified: J43.9

## 2014-11-14 HISTORY — DX: Unspecified chronic bronchitis: J42

## 2014-11-14 HISTORY — DX: Dorsalgia, unspecified: M54.9

## 2014-11-14 LAB — URINALYSIS, ROUTINE W REFLEX MICROSCOPIC
Bilirubin Urine: NEGATIVE
Glucose, UA: NEGATIVE mg/dL
Ketones, ur: NEGATIVE mg/dL
Nitrite: NEGATIVE
Protein, ur: NEGATIVE mg/dL
Specific Gravity, Urine: 1.017 (ref 1.005–1.030)
Urobilinogen, UA: 1 mg/dL (ref 0.0–1.0)
pH: 6 (ref 5.0–8.0)

## 2014-11-14 LAB — I-STAT CG4 LACTIC ACID, ED
Lactic Acid, Venous: 4.5 mmol/L — ABNORMAL HIGH (ref 0.5–2.2)
Lactic Acid, Venous: 0.62 mmol/L (ref 0.5–2.2)

## 2014-11-14 LAB — CBC
HCT: 40.3 % (ref 36.0–46.0)
HCT: 35.5 % — ABNORMAL LOW (ref 36.0–46.0)
Hemoglobin: 13.6 g/dL (ref 12.0–15.0)
Hemoglobin: 11.6 g/dL — ABNORMAL LOW (ref 12.0–15.0)
MCH: 29.4 pg (ref 26.0–34.0)
MCH: 28.6 pg (ref 26.0–34.0)
MCHC: 33.7 g/dL (ref 30.0–36.0)
MCHC: 32.7 g/dL (ref 30.0–36.0)
MCV: 87.2 fL (ref 78.0–100.0)
MCV: 87.4 fL (ref 78.0–100.0)
Platelets: 248 10*3/uL (ref 150–400)
Platelets: 246 10*3/uL (ref 150–400)
RBC: 4.62 MIL/uL (ref 3.87–5.11)
RBC: 4.06 MIL/uL (ref 3.87–5.11)
RDW: 14.8 % (ref 11.5–15.5)
RDW: 14.8 % (ref 11.5–15.5)
WBC: 9.9 10*3/uL (ref 4.0–10.5)
WBC: 9.1 10*3/uL (ref 4.0–10.5)

## 2014-11-14 LAB — CREATININE, SERUM
Creatinine, Ser: 0.69 mg/dL (ref 0.50–1.10)
GFR calc Af Amer: >90 mL/min (ref >90)
GFR calc non Af Amer: >90 mL/min (ref >90)

## 2014-11-14 LAB — URINE MICROSCOPIC-ADD ON

## 2014-11-14 LAB — COMPREHENSIVE METABOLIC PANEL
ALT: 15 U/L (ref 0–35)
AST: 31 U/L (ref 0–37)
Albumin: 3.6 g/dL (ref 3.5–5.2)
Alkaline Phosphatase: 33 U/L — ABNORMAL LOW (ref 39–117)
Anion gap: 11 (ref 5–15)
BUN: 9 mg/dL (ref 6–23)
CO2: 23 mmol/L (ref 19–32)
Calcium: 9.6 mg/dL (ref 8.4–10.5)
Chloride: 104 mEq/L (ref 96–112)
Creatinine, Ser: 0.96 mg/dL (ref 0.50–1.10)
GFR calc Af Amer: 80 mL/min — ABNORMAL LOW (ref >90)
GFR calc non Af Amer: 69 mL/min — ABNORMAL LOW (ref >90)
Glucose, Bld: 158 mg/dL — ABNORMAL HIGH (ref 70–99)
Potassium: 3.3 mmol/L — ABNORMAL LOW (ref 3.5–5.1)
Sodium: 138 mmol/L (ref 135–145)
Total Bilirubin: 0.3 mg/dL (ref 0.3–1.2)
Total Protein: 7.3 g/dL (ref 6.0–8.3)

## 2014-11-14 LAB — I-STAT TROPONIN, ED: Troponin i, poc: 0 ng/mL (ref 0.00–0.08)

## 2014-11-14 LAB — BRAIN NATRIURETIC PEPTIDE: B Natriuretic Peptide: 43.9 pg/mL (ref 0.0–100.0)

## 2014-11-14 LAB — TROPONIN I: Troponin I: <0.03 ng/mL (ref <0.031)

## 2014-11-14 LAB — D-DIMER, QUANTITATIVE: D-Dimer, Quant: 0.28 ug/mL-FEU (ref 0.00–0.48)

## 2014-11-14 MED ORDER — IPRATROPIUM-ALBUTEROL 0.5-2.5 (3) MG/3ML IN SOLN: 3.0000 mL | RESPIRATORY_TRACT | Status: DC

## 2014-11-14 MED ORDER — METHYLPREDNISOLONE SODIUM SUCC 125 MG IJ SOLR
60.0000 mg | Freq: Four times a day (QID) | INTRAMUSCULAR | Status: DC
  Administered 2014-11-15 – 2014-11-16 (×6): 60 mg via INTRAVENOUS
  Filled 2014-11-14: qty 2
  Filled 2014-11-14 (×8): qty 0.96
  Filled 2014-11-14: qty 2

## 2014-11-14 MED ORDER — MENTHOL 3 MG MT LOZG
1.0000 | LOZENGE | OROMUCOSAL | Status: DC | PRN
  Filled 2014-11-14: qty 9

## 2014-11-14 MED ORDER — NICOTINE 21 MG/24HR TD PT24
21.0000 mg | MEDICATED_PATCH | Freq: Every day | TRANSDERMAL | Status: DC
  Administered 2014-11-14 – 2014-11-16 (×3): 21 mg via TRANSDERMAL
  Filled 2014-11-14 (×3): qty 1

## 2014-11-14 MED ORDER — CETYLPYRIDINIUM CHLORIDE 0.05 % MT LIQD
7.0000 mL | Freq: Two times a day (BID) | OROMUCOSAL | Status: DC
Start: 2014-11-14 — End: 2014-11-16
  Administered 2014-11-14 – 2014-11-16 (×3): 7 mL via OROMUCOSAL

## 2014-11-14 MED ORDER — SODIUM CHLORIDE 0.9 % IV BOLUS (SEPSIS)
1000.0000 mL | Freq: Once | INTRAVENOUS | Status: AC
  Administered 2014-11-14: 1000 mL via INTRAVENOUS

## 2014-11-14 MED ORDER — ONDANSETRON HCL 4 MG/2ML IJ SOLN: 4.0000 mg | Freq: Three times a day (TID) | INTRAMUSCULAR | Status: DC | PRN

## 2014-11-14 MED ORDER — GUAIFENESIN-DM 100-10 MG/5ML PO SYRP
5.0000 mL | ORAL_SOLUTION | ORAL | Status: DC | PRN
  Filled 2014-11-14: qty 5

## 2014-11-14 MED ORDER — ONDANSETRON HCL 4 MG PO TABS
4.0000 mg | ORAL_TABLET | Freq: Four times a day (QID) | ORAL | Status: DC | PRN
  Administered 2014-11-15: 4 mg via ORAL
  Filled 2014-11-14 (×2): qty 1

## 2014-11-14 MED ORDER — OXYCODONE-ACETAMINOPHEN 5-325 MG PO TABS
1.0000 | ORAL_TABLET | Freq: Four times a day (QID) | ORAL | Status: DC | PRN
  Administered 2014-11-14: 1 via ORAL
  Filled 2014-11-14: qty 1

## 2014-11-14 MED ORDER — ONDANSETRON HCL 4 MG PO TABS: 4.0000 mg | ORAL_TABLET | Freq: Four times a day (QID) | ORAL | Status: DC | PRN

## 2014-11-14 MED ORDER — ACETAMINOPHEN 650 MG RE SUPP: 650.0000 mg | Freq: Four times a day (QID) | RECTAL | Status: DC | PRN

## 2014-11-14 MED ORDER — AMPHETAMINE-DEXTROAMPHET ER 10 MG PO CP24
20.0000 mg | ORAL_CAPSULE | Freq: Every day | ORAL | Status: DC
  Administered 2014-11-15 – 2014-11-16 (×2): 20 mg via ORAL
  Filled 2014-11-14 (×2): qty 2

## 2014-11-14 MED ORDER — ALBUTEROL SULFATE (2.5 MG/3ML) 0.083% IN NEBU: 2.5000 mg | INHALATION_SOLUTION | RESPIRATORY_TRACT | Status: DC | PRN

## 2014-11-14 MED ORDER — ONDANSETRON HCL 4 MG/2ML IJ SOLN
4.0000 mg | Freq: Once | INTRAMUSCULAR | Status: AC
  Administered 2014-11-14: 4 mg via INTRAVENOUS
  Filled 2014-11-14: qty 2

## 2014-11-14 MED ORDER — PREGABALIN 75 MG PO CAPS
225.0000 mg | ORAL_CAPSULE | Freq: Two times a day (BID) | ORAL | Status: DC
  Administered 2014-11-14 – 2014-11-16 (×4): 225 mg via ORAL
  Filled 2014-11-14: qty 3
  Filled 2014-11-14: qty 9
  Filled 2014-11-14 (×2): qty 3

## 2014-11-14 MED ORDER — ILOPERIDONE 6 MG PO TABS: 6.0000 mg | ORAL_TABLET | Freq: Three times a day (TID) | ORAL | Status: DC

## 2014-11-14 MED ORDER — KETOROLAC TROMETHAMINE 30 MG/ML IJ SOLN
30.0000 mg | Freq: Once | INTRAMUSCULAR | Status: AC
  Administered 2014-11-14: 30 mg via INTRAVENOUS
  Filled 2014-11-14: qty 1

## 2014-11-14 MED ORDER — DEXTROSE 5 % IV SOLN
500.0000 mg | INTRAVENOUS | Status: DC
  Administered 2014-11-14 – 2014-11-15 (×2): 500 mg via INTRAVENOUS
  Filled 2014-11-14 (×4): qty 500

## 2014-11-14 MED ORDER — IPRATROPIUM-ALBUTEROL 0.5-2.5 (3) MG/3ML IN SOLN
3.0000 mL | RESPIRATORY_TRACT | Status: DC
  Administered 2014-11-14 – 2014-11-15 (×4): 3 mL via RESPIRATORY_TRACT
  Filled 2014-11-14 (×5): qty 3

## 2014-11-14 MED ORDER — SODIUM CHLORIDE 0.9 % IV SOLN
INTRAVENOUS | Status: DC
  Administered 2014-11-14 (×2): via INTRAVENOUS

## 2014-11-14 MED ORDER — IPRATROPIUM-ALBUTEROL 0.5-2.5 (3) MG/3ML IN SOLN
3.0000 mL | Freq: Once | RESPIRATORY_TRACT | Status: AC
  Administered 2014-11-14: 3 mL via RESPIRATORY_TRACT
  Filled 2014-11-14: qty 3

## 2014-11-14 MED ORDER — LAMOTRIGINE 200 MG PO TABS
200.0000 mg | ORAL_TABLET | Freq: Two times a day (BID) | ORAL | Status: DC
  Administered 2014-11-14 – 2014-11-16 (×4): 200 mg via ORAL
  Filled 2014-11-14 (×7): qty 1

## 2014-11-14 MED ORDER — ENOXAPARIN SODIUM 40 MG/0.4ML ~~LOC~~ SOLN
40.0000 mg | SUBCUTANEOUS | Status: DC
  Administered 2014-11-14 – 2014-11-15 (×2): 40 mg via SUBCUTANEOUS
  Filled 2014-11-14 (×3): qty 0.4

## 2014-11-14 MED ORDER — UMECLIDINIUM-VILANTEROL 62.5-25 MCG/INH IN AEPB: 1.0000 | INHALATION_SPRAY | Freq: Every day | RESPIRATORY_TRACT | Status: DC

## 2014-11-14 MED ORDER — ONDANSETRON HCL 4 MG/2ML IJ SOLN: 4.0000 mg | Freq: Four times a day (QID) | INTRAMUSCULAR | Status: DC | PRN

## 2014-11-14 MED ORDER — ILOPERIDONE 4 MG PO TABS
6.0000 mg | ORAL_TABLET | Freq: Three times a day (TID) | ORAL | Status: DC
  Administered 2014-11-14 – 2014-11-16 (×3): 6 mg via ORAL
  Filled 2014-11-14 (×8): qty 2

## 2014-11-14 MED ORDER — ALPRAZOLAM 0.5 MG PO TABS
1.0000 mg | ORAL_TABLET | Freq: Four times a day (QID) | ORAL | Status: DC
  Administered 2014-11-14 – 2014-11-16 (×7): 1 mg via ORAL
  Filled 2014-11-14 (×7): qty 2

## 2014-11-14 MED ORDER — ZOLPIDEM TARTRATE 5 MG PO TABS
10.0000 mg | ORAL_TABLET | Freq: Every day | ORAL | Status: DC
  Administered 2014-11-14 – 2014-11-15 (×2): 10 mg via ORAL
  Filled 2014-11-14 (×2): qty 2

## 2014-11-14 MED ORDER — AMPHETAMINE-DEXTROAMPHET ER 20 MG PO CP24: 20.0000 mg | ORAL_CAPSULE | Freq: Every day | ORAL | Status: DC

## 2014-11-14 MED ORDER — OXYCODONE-ACETAMINOPHEN 5-325 MG PO TABS
1.0000 | ORAL_TABLET | Freq: Once | ORAL | Status: AC
  Administered 2014-11-14: 1 via ORAL
  Filled 2014-11-14: qty 1

## 2014-11-14 MED ORDER — MORPHINE SULFATE ER 15 MG PO TBCR
60.0000 mg | EXTENDED_RELEASE_TABLET | Freq: Two times a day (BID) | ORAL | Status: DC
  Administered 2014-11-14 – 2014-11-16 (×4): 60 mg via ORAL
  Filled 2014-11-14 (×4): qty 4

## 2014-11-14 MED ORDER — SODIUM CHLORIDE 0.9 % IV SOLN: INTRAVENOUS | Status: AC

## 2014-11-14 MED ORDER — BENZONATATE 100 MG PO CAPS
100.0000 mg | ORAL_CAPSULE | Freq: Three times a day (TID) | ORAL | Status: DC
  Administered 2014-11-14 – 2014-11-16 (×6): 100 mg via ORAL
  Filled 2014-11-14 (×10): qty 1

## 2014-11-14 MED ORDER — METHYLPREDNISOLONE SODIUM SUCC 125 MG IJ SOLR: 125.0000 mg | Freq: Once | INTRAMUSCULAR | Status: DC

## 2014-11-14 MED ORDER — ACETAMINOPHEN 325 MG PO TABS: 650.0000 mg | ORAL_TABLET | Freq: Four times a day (QID) | ORAL | Status: DC | PRN

## 2014-11-14 MED ORDER — HYDROMORPHONE HCL 1 MG/ML IJ SOLN: 0.5000 mg | INTRAMUSCULAR | Status: DC | PRN

## 2014-11-14 MED ORDER — DEXTROSE 5 % IV SOLN
1.0000 g | INTRAVENOUS | Status: DC
  Administered 2014-11-14 – 2014-11-15 (×2): 1 g via INTRAVENOUS
  Filled 2014-11-14 (×4): qty 10

## 2014-11-14 MED ORDER — DOXYCYCLINE HYCLATE 100 MG PO TABS
100.0000 mg | ORAL_TABLET | Freq: Once | ORAL | Status: AC
  Administered 2014-11-14: 100 mg via ORAL
  Filled 2014-11-14: qty 1

## 2014-11-14 MED ORDER — INFLUENZA VAC SPLIT QUAD 0.5 ML IM SUSY
0.5000 mL | PREFILLED_SYRINGE | INTRAMUSCULAR | Status: DC
  Filled 2014-11-14: qty 0.5

## 2014-11-14 MED ORDER — POTASSIUM CHLORIDE CRYS ER 20 MEQ PO TBCR
40.0000 meq | EXTENDED_RELEASE_TABLET | Freq: Once | ORAL | Status: AC
  Administered 2014-11-14: 40 meq via ORAL
  Filled 2014-11-14: qty 2

## 2014-11-14 NOTE — ED Notes (Signed)
Lactic acid results given to Dr. Rhunette CroftNanavati on [address]

## 2014-11-14 NOTE — Telephone Encounter (Signed)
Has this PA been done? Thanks. 

## 2014-11-14 NOTE — Telephone Encounter (Signed)
This is in Dr Evlyn CourierSood's look at to be signed. Will send to him to make aware that this is needed back asap.

## 2014-11-14 NOTE — ED Notes (Signed)
Patient states she has had chest pain and sob worsening since last night.  She also reports feeling nauseated.  She describes her pain as sharp,.  Patient is alert.  Fatigue in presentation.  Patient is seen by Dr Craige CottaSood for heart and lung.  Patient has hx of copd and emphysema.  No hx of mi.  Patient has used her inhaler this morning.  Only short improvement in her sx.  Patient denies weight gain.  Denies swelling

## 2014-11-14 NOTE — ED Notes (Signed)
Pt O2 sat down to 89% on RA, placed on 2L of O2 per [place].

## 2014-11-14 NOTE — Progress Notes (Signed)
Patient arrived on unit via stretcher from ED, no family at bedside telemetry placed per MD order.

## 2014-11-14 NOTE — H&P (Signed)
History and Physical       Hospital Admission Note Date: 11/14/2014  Patient name: [name] Medical record number: [id number] Date of birth: 1966-01-27 Age: 49 y.o. Gender: female  PCP: Thayer Headings, MD    Chief Complaint:  Shortness of breath, with wheezing and chest tightness  HPI: Patient is a 49 year old female with bipolar disorder, depression on multiple psych medications, and his initial lung disease, fibromyalgia, presented to ED shortness breath, coughing and wheezing and chest tightness since yesterday. Patient reported subjective fevers and chills, increasing shortness of breath, wheezing and productive cough with greenish sputum. Patient tried albuterol inhaler at home with minimal relief. She also complained of one episode of diarrhea, vomiting but no abdominal pain, dysuria, hematuria, hematochezia, melena or hematemesis. She also reported intermittent chest tightness, worse with activity and coughing. In ED, patient was hypoxic, O2 sats 88%, still wheezing, hospitalist service called for admission  Review of Systems:  Constitutional: + fever, chills, diaphoresis, poor appetite and fatigue.  HEENT: Denies photophobia, eye pain, redness, hearing loss, ear pain, congestion, sore throat, rhinorrhea, sneezing, mouth sores, trouble swallowing, neck pain, neck stiffness and tinnitus.   Respiratory: Please see history of present illness.   Cardiovascular: Denies chest pain, palpitations and leg swelling.  Gastrointestinal: Denies nausea, vomiting, abdominal pain, diarrhea, constipation, blood in stool and abdominal distention.  Genitourinary: Denies dysuria, urgency, frequency, hematuria, flank pain and difficulty urinating.  Musculoskeletal: Denies myalgias, back pain, joint swelling, arthralgias and gait problem.  Skin: Denies pallor, rash and wound.  Neurological: Denies dizziness, seizures, syncope, weakness,  light-headedness, numbness and headaches.  Hematological: Denies adenopathy. Easy bruising, personal or family bleeding history  Psychiatric/Behavioral: Denies suicidal ideation, mood changes, confusion, nervousness, sleep disturbance and agitation  Past Medical History: Past Medical History  Diagnosis Date  . Abscess     rectal  . Depression   . Headache(784.0)   . Fibromyalgia   . Ovarian cyst   . Endometriosis   . Back pain   . Pulmonary infiltrates   . ILD (interstitial lung disease)   . Dyspnea    Past Surgical History  Procedure Laterality Date  . Appendectomy  1993  . Abdominal hysterectomy  1993  . Cholecystectomy    . Breast surgery  2008    left breast lumpectomy  . Examination under anesthesia  02/15/2012    Procedure: EXAM UNDER ANESTHESIA;  Surgeon: Maisie Fus A. Cornett, MD;  Location: [facility];  Service: General;  Laterality: Left;  Excision of perineal cyst  . Video bronchoscopy Bilateral 02/15/2014    Procedure: VIDEO BRONCHOSCOPY WITH FLUORO;  Surgeon: Coralyn Helling, MD;  Location: [facility];  Service: Cardiopulmonary;  Laterality: Bilateral;    Medications: Prior to Admission medications   Medication Sig Start Date End Date Taking? Authorizing Provider  albuterol (PROVENTIL HFA;VENTOLIN HFA) 108 (90 BASE) MCG/ACT inhaler Inhale 2 puffs into the lungs every 6 (six) hours as needed for wheezing or shortness of breath. 11/04/14  Yes Coralyn Helling, MD  ALPRAZolam Prudy Feeler) 1 MG tablet Take 1 mg by mouth 4 (four) times daily.    Yes Historical Provider, MD  amphetamine-dextroamphetamine (ADDERALL XR) 20 MG 24 hr capsule Take 20 mg by mouth daily. 10/16/14  Yes Historical Provider, MD  FANAPT 6 MG TABS Take 6 mg by mouth 3 (three) times daily.  02/26/14  Yes Historical Provider, MD  FLUoxetine (PROZAC) 40 MG capsule Take 40 mg by mouth 2 (two) times daily.    Yes Historical Provider, MD  lamoTRIgine (LAMICTAL) 200 MG tablet Take 200 mg by mouth 2 (two) times  daily.  02/17/14  Yes Historical Provider, MD  OPANA ER, CRUSH RESISTANT, 20 MG T12A Take 20 mg by mouth every 12 (twelve) hours. 11/04/14  Yes Historical Provider, MD  oxyCODONE-acetaminophen (PERCOCET/ROXICET) 5-325 MG per tablet Take 1 tablet by mouth every 6 (six) hours as needed for severe pain. 11/03/14  Yes Jamesetta Orleanshristopher W Lawyer, PA-C  pregabalin (LYRICA) 225 MG capsule Take 225 mg by mouth 2 (two) times daily.   Yes Historical Provider, MD  tiZANidine (ZANAFLEX) 4 MG tablet Take 4 mg by mouth every 6 (six) hours as needed for muscle spasms.   Yes Historical Provider, MD  Umeclidinium-Vilanterol 62.5-25 MCG/INH AEPB Inhale 1 puff into the lungs daily.   Yes Historical Provider, MD  zolpidem (AMBIEN) 10 MG tablet Take 10 mg by mouth at bedtime.  02/25/14  Yes Historical Provider, MD  formoterol (FORADIL) 12 MCG capsule for inhaler Place 1 capsule (12 mcg total) into inhaler and inhale 2 (two) times daily. Patient not taking: Reported on 11/14/2014 11/04/14   Coralyn HellingVineet Sood, MD  ibuprofen (ADVIL,MOTRIN) 800 MG tablet Take 1 tablet (800 mg total) by mouth every 8 (eight) hours as needed. 11/03/14   Jamesetta Orleanshristopher W Lawyer, PA-C  naproxen (NAPROSYN) 375 MG tablet Take 1 tablet (375 mg total) by mouth 2 (two) times daily. Patient not taking: Reported on 11/14/2014 06/19/14   Dorthula Matasiffany G Greene, PA-C  rizatriptan (MAXALT) 10 MG tablet Take 20 mg by mouth daily as needed for migraine. May repeat in 2 hours if needed    Historical Provider, MD    Allergies:   Allergies  Allergen Reactions  . Cortisone Acetate Shortness Of Breath and Other (See Comments)    Makes patient feel as if she is choking.  . Vicodin [Hydrocodone-Acetaminophen] Itching    Social History:  reports that she has been smoking Cigarettes.  She has a 15 pack-year smoking history. She has never used smokeless tobacco. She reports that she does not drink alcohol or use illicit drugs.  Family History: Family History  Problem Relation Age of  Onset  . Cancer Father     lung  . COPD Father   . Early death Father   . Other Neg Hx   . Hypertension Sister   . Cancer Maternal Aunt     LUNG CANCER    Physical Exam: Blood pressure 104/62, pulse 82, resp. rate 18, height 5\' 2"  (1.575 m), weight 92.534 kg (204 lb), SpO2 98 %. General: Alert, awake, oriented x3, in no acute distress. HEENT: normocephalic, atraumatic, anicteric sclera, pink conjunctiva, pupils equal and reactive to light and accomodation, oropharynx clear Neck: supple, no masses or lymphadenopathy, no goiter, no bruits  Heart: Regular rate and rhythm, without murmurs, rubs or gallops. Lungs: Diffuse wheezing bilaterally with rhonchi Abdomen: Soft, nontender, nondistended, positive bowel sounds, no masses. Extremities: No clubbing, cyanosis or edema with positive pedal pulses. Neuro: Grossly intact, no focal neurological deficits, strength 5/5 upper and lower extremities bilaterally Psych: alert and oriented x 3, normal mood and affect Skin: no rashes or lesions, warm and dry   LABS on Admission:  Basic Metabolic Panel:  Recent Labs Lab 11/14/14 1015  NA 138  K 3.3*  CL 104  CO2 23  GLUCOSE 158*  BUN 9  CREATININE 0.96  CALCIUM 9.6   Liver Function Tests:  Recent Labs Lab 11/14/14 1015  AST 31  ALT 15  ALKPHOS 33*  BILITOT  0.3  PROT 7.3  ALBUMIN 3.6   No results for input(s): LIPASE, AMYLASE in the last 168 hours. No results for input(s): AMMONIA in the last 168 hours. CBC:  Recent Labs Lab 11/14/14 0947  WBC 9.9  HGB 13.6  HCT 40.3  MCV 87.2  PLT 248   Cardiac Enzymes: No results for input(s): CKTOTAL, CKMB, CKMBINDEX, TROPONINI in the last 168 hours. BNP: Invalid input(s): POCBNP CBG: No results for input(s): GLUCAP in the last 168 hours.   Radiological Exams on Admission: Dg Chest 2 View  11/14/2014   CLINICAL DATA:  Chest pain, shortness of breath for 3 days  EXAM: CHEST  2 VIEW  COMPARISON:  02/08/2014  FINDINGS: The  heart size and mediastinal contours are within normal limits. Both lungs are clear. The visualized skeletal structures are unremarkable.  IMPRESSION: No active cardiopulmonary disease.   Electronically Signed   By: Elige Ko   On: 11/14/2014 11:11   Dg Finger Thumb Right  11/03/2014   CLINICAL DATA:  Status post fall in the tuft and hurt the right thumb this morning.  EXAM: RIGHT THUMB 2+V  COMPARISON:  None.  FINDINGS: There is subtle radiopaque lucency identified at the base of the right first distal phalanx suspicious for fracture. There is no dislocation.  IMPRESSION: Subtle radiolucency identified the base of the right first distal phalanx suspicious for fracture.   Electronically Signed   By: Sherian Rein M.D.   On: 11/03/2014 12:24    Assessment/Plan Principal Problem:   Acute hypoxic respiratory failure with acute exacerbation of COPD: Patient able to speak in full sentences, O2 sats currently 98% on 2 L - Admit to MedSurg, placed on scheduled bronchodilators, O2, IV Solu-Medrol - Flutter valve, placed on IV Zithromax and Rocephin  Active Problems:   Bipolar disorder: - Currently on multiple psych meds, continue Adderall, Fanapt, Xanax - Holding Prozac due to prolonged QTC    Tobacco abuse - Patient counseled on smoking cessation, placed on NicoDerm patch    Hypokalemia - Replaced    Abnormal EKG - EKG reviewed, prolonged QTC 593, avoid fluoroquinolones, discontinued Prozac  DVT prophylaxis: Lovenox   CODE STATUS: Discussed in detail with the patient, requested to be full CODE STATUS  Family Communication: Admission, patients condition and plan of care including tests being ordered have been discussed with the patient  who indicates understanding and agree with the plan and Code Status   Further plan will depend as patient's clinical course evolves and further radiologic and laboratory data become available.   Time Spent on Admission: 55 mins   RAI,RIPUDEEP  M.D. [facility] 11/14/2014, 5:20 PM Pager: [phone]  If 7PM-7AM, please contact night-coverage [email or web address] Password [email or web address]

## 2014-11-14 NOTE — ED Provider Notes (Signed)
CSN: [id number]     Arrival date & time 11/14/14  0932 History   First MD Initiated Contact with Patient 11/14/14 [phone]     Chief Complaint  Patient presents with  . Shortness of Breath  . Chest Pain   HPI  Patient is a 49 year old female with past medical history of COPD, interstitial lung disease, fibromyalgia, chronic pain seen by a pain management clinic, and depression who presents emergency room for evaluation of shortness of breath and cough. He states that yesterday she developed increasing shortness of breath, wheezing, cough productive of green sputum.  She states that she tried her inhalers at home with minimal relief for a short amount of time. She states that she used her pro-air twice last night during the night. She is also complaining of one episode of diarrhea and 3 episodes of vomiting. She states that the emesis is bilious in nature. She denies fever, abdominal pain, urinary frequency, urgency, melena, hematochezia, hematemesis. She also complains of some intermittent substernal chest pain that seems to be worse with activity. She is currently having very mild chest pain. She states that nothing seems to make her chest pain any better. She does feel that it slightly worse with exertion. Patient denies history of recent surgery, recent travel, use of exogenous estrogen, hemoptysis, and history of cancer. Patient denies history of DVT or PE. Shouldn't is followed by Dr. Craige Cotta from [facility] pulmonary.  Past Medical History  Diagnosis Date  . Abscess     rectal  . Depression   . Headache(784.0)   . Fibromyalgia   . Ovarian cyst   . Endometriosis   . Back pain   . Pulmonary infiltrates   . ILD (interstitial lung disease)   . Dyspnea    Past Surgical History  Procedure Laterality Date  . Appendectomy  1993  . Abdominal hysterectomy  1993  . Cholecystectomy    . Breast surgery  2008    left breast lumpectomy  . Examination under anesthesia  02/15/2012    Procedure: EXAM UNDER  ANESTHESIA;  Surgeon: Maisie Fus A. Cornett, MD;  Location: [facility];  Service: General;  Laterality: Left;  Excision of perineal cyst  . Video bronchoscopy Bilateral 02/15/2014    Procedure: VIDEO BRONCHOSCOPY WITH FLUORO;  Surgeon: Coralyn Helling, MD;  Location: [facility];  Service: Cardiopulmonary;  Laterality: Bilateral;   Family History  Problem Relation Age of Onset  . Cancer Father     lung  . COPD Father   . Early death Father   . Other Neg Hx   . Hypertension Sister   . Cancer Maternal Aunt     LUNG CANCER   History  Substance Use Topics  . Smoking status: Current Every Day Smoker -- 0.50 packs/day for 30 years    Types: Cigarettes  . Smokeless tobacco: Never Used  . Alcohol Use: No   OB History    Gravida Para Term Preterm AB TAB SAB Ectopic Multiple Living   0 0 0 0 0 0 2     Review of Systems  Constitutional: Negative for fever, chills and fatigue.  Respiratory: Positive for cough, chest tightness, shortness of breath and wheezing.   Cardiovascular: Positive for chest pain. Negative for palpitations and leg swelling.  Gastrointestinal: Positive for nausea, vomiting and diarrhea. Negative for abdominal pain, constipation, blood in stool and anal bleeding.  Genitourinary: Negative for dysuria, urgency, frequency and hematuria.  Musculoskeletal: Negative for back pain.  Skin: Negative  for wound.  All other systems reviewed and are negative.     Allergies  Cortisone acetate and Vicodin  Home Medications   Prior to Admission medications   Medication Sig Start Date End Date Taking? Authorizing Provider  albuterol (PROVENTIL HFA;VENTOLIN HFA) 108 (90 BASE) MCG/ACT inhaler Inhale 2 puffs into the lungs every 6 (six) hours as needed for wheezing or shortness of breath. 11/04/14  Yes Coralyn Helling, MD  ALPRAZolam Prudy Feeler) 1 MG tablet Take 1 mg by mouth 4 (four) times daily.    Yes Historical Provider, MD  amphetamine-dextroamphetamine (ADDERALL XR) 20  MG 24 hr capsule Take 20 mg by mouth daily. 10/16/14  Yes Historical Provider, MD  FANAPT 6 MG TABS Take 6 mg by mouth 3 (three) times daily.  02/26/14  Yes Historical Provider, MD  FLUoxetine (PROZAC) 40 MG capsule Take 40 mg by mouth 2 (two) times daily.    Yes Historical Provider, MD  lamoTRIgine (LAMICTAL) 200 MG tablet Take 200 mg by mouth 2 (two) times daily.  02/17/14  Yes Historical Provider, MD  OPANA ER, CRUSH RESISTANT, 20 MG T12A Take 20 mg by mouth every 12 (twelve) hours. 11/04/14  Yes Historical Provider, MD  oxyCODONE-acetaminophen (PERCOCET/ROXICET) 5-325 MG per tablet Take 1 tablet by mouth every 6 (six) hours as needed for severe pain. 11/03/14  Yes Jamesetta Orleans Lawyer, PA-C  pregabalin (LYRICA) 225 MG capsule Take 225 mg by mouth 2 (two) times daily.   Yes Historical Provider, MD  tiZANidine (ZANAFLEX) 4 MG tablet Take 4 mg by mouth every 6 (six) hours as needed for muscle spasms.   Yes Historical Provider, MD  Umeclidinium-Vilanterol 62.5-25 MCG/INH AEPB Inhale 1 puff into the lungs daily.   Yes Historical Provider, MD  zolpidem (AMBIEN) 10 MG tablet Take 10 mg by mouth at bedtime.  02/25/14  Yes Historical Provider, MD  formoterol (FORADIL) 12 MCG capsule for inhaler Place 1 capsule (12 mcg total) into inhaler and inhale 2 (two) times daily. Patient not taking: Reported on 11/14/2014 11/04/14   Coralyn Helling, MD  ibuprofen (ADVIL,MOTRIN) 800 MG tablet Take 1 tablet (800 mg total) by mouth every 8 (eight) hours as needed. 11/03/14   Jamesetta Orleans Lawyer, PA-C  naproxen (NAPROSYN) 375 MG tablet Take 1 tablet (375 mg total) by mouth 2 (two) times daily. Patient not taking: Reported on 11/14/2014 06/19/14   Dorthula Matas, PA-C  rizatriptan (MAXALT) 10 MG tablet Take 20 mg by mouth daily as needed for migraine. May repeat in 2 hours if needed    Historical Provider, MD   BP 104/62 mmHg  Pulse 82  Resp 18  Ht  (1.575 m)  Wt 204 lb (92.534 kg)  BMI 37.30 kg/m2  SpO2 98% Physical Exam   Constitutional: She is oriented to person, place, and time. She appears well-developed and well-nourished. No distress.  HENT:  Head: Normocephalic and atraumatic.  Mouth/Throat: Oropharynx is clear and moist. No oropharyngeal exudate.  Eyes: Conjunctivae and EOM are normal. Pupils are equal, round, and reactive to light. No scleral icterus.  Neck: Normal range of motion. Neck supple. No JVD present. No tracheal deviation present. No thyromegaly present.  Cardiovascular: Normal rate, regular rhythm, normal heart sounds and intact distal pulses.  Exam reveals no gallop and no friction rub.   No murmur heard. Pulmonary/Chest: Effort normal. No respiratory distress. She has wheezes (heard best in the right lower lung field). She has rales (Heard in bilateral bases). She exhibits no tenderness.  Abdominal:  Soft. Bowel sounds are normal. She exhibits no distension and no mass. There is no tenderness. There is no rebound and no guarding.  Musculoskeletal: Normal range of motion.  Lymphadenopathy:    She has no cervical adenopathy.  Neurological: She is alert and oriented to person, place, and time. She has normal strength. No cranial nerve deficit or sensory deficit.  Skin: Skin is warm and dry. She is not diaphoretic.  Psychiatric: She has a normal mood and affect. Her behavior is normal. Judgment and thought content normal.  Nursing note and vitals reviewed.   ED Course  Procedures (including critical care time) Labs Review Labs Reviewed  COMPREHENSIVE METABOLIC PANEL - Abnormal; Notable for the following:    Potassium 3.3 (*)    Glucose, Bld 158 (*)    Alkaline Phosphatase 33 (*)    GFR calc non Af Amer 69 (*)    GFR calc Af Amer 80 (*)    All other components within normal limits  URINALYSIS, ROUTINE W REFLEX MICROSCOPIC - Abnormal; Notable for the following:    Color, Urine AMBER (*)    Hgb urine dipstick LARGE (*)    Leukocytes, UA TRACE (*)    All other components within normal  limits  URINE MICROSCOPIC-ADD ON - Abnormal; Notable for the following:    Squamous Epithelial / LPF FEW (*)    All other components within normal limits  I-STAT CG4 LACTIC ACID, ED - Abnormal; Notable for the following:    Lactic Acid, Venous 4.50 (*)    All other components within normal limits  CBC  BRAIN NATRIURETIC PEPTIDE  D-DIMER, QUANTITATIVE  I-STAT TROPOININ, ED  I-STAT TROPOININ, ED  I-STAT TROPOININ, ED  I-STAT CG4 LACTIC ACID, ED    Imaging Review Dg Chest 2 View  11/14/2014   CLINICAL DATA:  Chest pain, shortness of breath for 3 days  EXAM: CHEST  2 VIEW  COMPARISON:  02/08/2014  FINDINGS: The heart size and mediastinal contours are within normal limits. Both lungs are clear. The visualized skeletal structures are unremarkable.  IMPRESSION: No active cardiopulmonary disease.   Electronically Signed   By: Elige KoHetal  Patel   On: 11/14/2014 11:11     EKG Interpretation   Date/Time:  Thursday November 14 2014 09:37:07 EST Ventricular Rate:  119 PR Interval:  136 QRS Duration: 78 QT Interval:  422 QTC Calculation: 593 R Axis:   56 Text Interpretation:  Sinus tachycardia Nonspecific ST and T wave  abnormality Prolonged QT Abnormal ECG QT is not prolonged, will repeat ekg  for QTc Confirmed by Rhunette CroftNANAVATI, MD, Janey GentaANKIT [phone]([id number]) on 11/14/2014 9:57:12 AM      MDM   Final diagnoses:  Non-intractable vomiting with nausea, vomiting of unspecified type  COPD exacerbation    Patient is a 49 year old female who presents emergency room for evaluation of shortness of breath, nausea, vomiting, and diarrhea. Initial physical exam reveals rales in the bilateral lower lung fields and minimal end expiratory wheezing. Patient improved after DuoNeb. Patient was clinically dehydrated on examination. Patient given 1 L normal saline bolus with great improvement. CBC unremarkable. CMP is unremarkable. I-STAT lactic acid is elevated at 4.5. I-STAT troponins negative. D-dimer is negative. EKG revealed  sinus tachycardia with nonspecific ST and T-wave abnormalities. QT was not prolonged clinically looking at EKG. Chest x-ray reveals no acute infiltrates. Lactic acid normalized after 1 L normal saline bolus. Patient given regular home medication for pain. And Toradol for headache. Patient ambulated with an average pulse ox dropping  down to 88%. Suspect that this may be a COPD exacerbation given worsening shortness of breath and cough with production of sputum. Will start on doxycycline oral. Will consult tried hospitalists for admission for COPD exacerbation. I spoke with Dr. Isidoro Donning who admitted the patient to med surge for COPD exacerbation. Temporary admission orders have been placed. Patient seen by and discussed with Dr. Rhunette Croft who agrees with the above workup and plan.  Eben Burow, PA-C 11/14/14 1657  Derwood Kaplan, MD 11/15/14 1742

## 2014-11-15 DIAGNOSIS — R9431 Abnormal electrocardiogram [ECG] [EKG]: Secondary | ICD-10-CM

## 2014-11-15 DIAGNOSIS — J9600 Acute respiratory failure, unspecified whether with hypoxia or hypercapnia: Secondary | ICD-10-CM

## 2014-11-15 LAB — BASIC METABOLIC PANEL
Anion gap: 9 (ref 5–15)
BUN: <5 mg/dL — ABNORMAL LOW (ref 6–23)
CO2: 19 mmol/L (ref 19–32)
Calcium: 9.1 mg/dL (ref 8.4–10.5)
Chloride: 112 mEq/L (ref 96–112)
Creatinine, Ser: 0.63 mg/dL (ref 0.50–1.10)
GFR calc Af Amer: >90 mL/min (ref >90)
GFR calc non Af Amer: >90 mL/min (ref >90)
Glucose, Bld: 168 mg/dL — ABNORMAL HIGH (ref 70–99)
Potassium: 4.1 mmol/L (ref 3.5–5.1)
Sodium: 140 mmol/L (ref 135–145)

## 2014-11-15 LAB — CBC
HCT: 37 % (ref 36.0–46.0)
Hemoglobin: 12.2 g/dL (ref 12.0–15.0)
MCH: 29 pg (ref 26.0–34.0)
MCHC: 33 g/dL (ref 30.0–36.0)
MCV: 87.9 fL (ref 78.0–100.0)
Platelets: 248 10*3/uL (ref 150–400)
RBC: 4.21 MIL/uL (ref 3.87–5.11)
RDW: 14.9 % (ref 11.5–15.5)
WBC: 8.2 10*3/uL (ref 4.0–10.5)

## 2014-11-15 LAB — TROPONIN I
Troponin I: <0.03 ng/mL (ref <0.031)
Troponin I: <0.03 ng/mL (ref <0.031)

## 2014-11-15 MED ORDER — IPRATROPIUM-ALBUTEROL 0.5-2.5 (3) MG/3ML IN SOLN
3.0000 mL | Freq: Three times a day (TID) | RESPIRATORY_TRACT | Status: DC
  Administered 2014-11-16: 3 mL via RESPIRATORY_TRACT
  Filled 2014-11-15: qty 3

## 2014-11-15 NOTE — Progress Notes (Signed)
Patient respiratory protocol done and patient scored an 8. Changing from Q4 to Tid and Prn. Patient xray clear and with history and rhonchi went over cough and deep breathing with patient to help clear secretions. Will continue to monitor until patient on a good level of treatments for her.

## 2014-11-15 NOTE — Progress Notes (Signed)
IV RAC infiltrated, mild edema noted.  IV team consult for restart.

## 2014-11-15 NOTE — Progress Notes (Signed)
[facility]                                                                              Patient Demographics  Julie JasmineConnie Price, is a 49 y.o. female, DOB - 01/15/1966, [id number]  Admit date - 11/14/2014   Admitting Physician Ripudeep Jenna LuoK Rai, MD  Outpatient Primary MD for the patient is Thayer HeadingsMACKENZIE,BRIAN, MD  LOS - 1   Chief Complaint  Patient presents with  . Shortness of Breath  . Chest Pain      HPI on 11/14/2014 by Dr. Herma Carsonipu Rai Patient is a 49 year old female with bipolar disorder, depression on multiple psych medications, and his initial lung disease, fibromyalgia, presented to ED shortness breath, coughing and wheezing and chest tightness since yesterday. Patient reported subjective fevers and chills, increasing shortness of breath, wheezing and productive cough with greenish sputum. Patient tried albuterol inhaler at home with minimal relief. She also complained of one episode of diarrhea, vomiting but no abdominal pain, dysuria, hematuria, hematochezia, melena or hematemesis. She also reported intermittent chest tightness, worse with activity and coughing. In ED, patient was hypoxic, O2 sats 88%, still wheezing, hospitalist service called for admission.   Assessment & Plan   Acute hypoxic Respiratory Failure with acute COPD Exacerbation -Admission, patient was satting at 98% on 2 L -Patient states her shortness of breath has not improved -Continue IV Solu-Medrol, bronchodilators, oxygen to maintain saturations above 92%, flutter valve, azithromycin and Rocephin  Bipolar disorder -Continue home medications: Adderall, Xanax, Fanapt -Prozac held due to prolonged QTC  Tobacco abuse -Patient counseled on smoking cessation, continue nicotine patch  Chronic pain -Continue home regimen  Hypokalemia -Resolved  Abnormal EKG -QTC prolonged at 593 -Prozac held, will avoid fluoroquinolones -Will repeat EKG -Patient denies chest pain  Code Status: Full  Family  Communication: None  Disposition Plan: Admitted   Time Spent in minutes   30  minutes  Procedures  None  Consults   None  DVT Prophylaxis  Lovenox  Lab Results  Component Value Date   PLT 248 11/15/2014    Medications  Scheduled Meds: . sodium chloride   Intravenous STAT  . ALPRAZolam  1 mg Oral QID  . amphetamine-dextroamphetamine  20 mg Oral Daily  . antiseptic oral rinse  7 mL Mouth Rinse BID  . azithromycin  500 mg Intravenous Q24H  . benzonatate  100 mg Oral TID  . cefTRIAXone (ROCEPHIN)  IV  1 g Intravenous Q24H  . enoxaparin (LOVENOX) injection  40 mg Subcutaneous Q24H  . iloperidone  6 mg Oral TID  . Influenza vac split quadrivalent PF  0.5 mL Intramuscular Tomorrow-1000  . ipratropium-albuterol  3 mL Nebulization Q4H  . lamoTRIgine  200 mg Oral BID  . methylPREDNISolone (SOLU-MEDROL) injection  125 mg Intravenous Once  . methylPREDNISolone (SOLU-MEDROL) injection  60 mg Intravenous Q6H  . morphine  60 mg Oral Q12H  . nicotine  21 mg Transdermal Daily  . pregabalin  225 mg Oral BID  . Umeclidinium-Vilanterol  1 puff Inhalation Daily  . zolpidem  10 mg Oral QHS   Continuous Infusions: . sodium chloride 75 mL/hr at 11/14/14 2008   PRN Meds:.acetaminophen **OR** acetaminophen, albuterol, guaiFENesin-dextromethorphan, HYDROmorphone (  DILAUDID) injection, menthol-cetylpyridinium, [DISCONTINUED] ondansetron **OR** ondansetron (ZOFRAN) IV, ondansetron, oxyCODONE-acetaminophen  Antibiotics    Anti-infectives    Start     Dose/Rate Route Frequency Ordered Stop   11/14/14 1800  azithromycin (ZITHROMAX) 500 mg in dextrose 5 % 250 mL IVPB     500 mg250 mL/hr over 60 Minutes Intravenous Every 24 hours 11/14/14 1756     11/14/14 1800  cefTRIAXone (ROCEPHIN) 1 g in dextrose 5 % 50 mL IVPB     1 g100 mL/hr over 30 Minutes Intravenous Every 24 hours 11/14/14 1756     11/14/14 1645  doxycycline (VIBRA-TABS) tablet 100 mg     100 mg Oral  Once 11/14/14 1632 11/14/14 1659          Subjective:   [name] seen and examined today.  Patient is shortness of breath and states she does not feel better. She also complains of chronic pain. Patient denies any chest pain or dizziness or abdominal pain at this time.   Objective:   Filed Vitals:   11/14/14 1801 11/14/14 2154 11/15/14 0536 11/15/14 0913  BP: 128/72 110/58 125/67   Pulse: 74 93 74   Temp: 97.6 F (36.4 C) 97.9 F (36.6 C) 98.2 F (36.8 C)   TempSrc: Oral Oral Oral   Resp: Height:  (1.575 m)     Weight: 91.4 kg (201 lb 8 oz) 92.2 kg (203 lb 4.2 oz)    SpO2: 100% 95% 94% 95%    Wt Readings from Last 3 Encounters:  11/14/14 92.2 kg (203 lb 4.2 oz)  10/31/14 92.897 kg (204 lb 12.8 oz)  06/17/14 84.823 kg (187 lb)     Intake/Output Summary (Last 24 hours) at 11/15/14 1119 Last data filed at 11/15/14 0901  Gross per 24 hour  Intake    290 ml  Output   1500 ml  Net  -1210 ml    Exam  General: Well developed, well nourished, NAD, appears stated age  HEENT: NCAT,  mucous membranes moist.   Cardiovascular: S1 S2 auscultated, no rubs, murmurs or gallops. Regular rate and rhythm.  Respiratory: Diffuse bilateral wheezing, coarse breath sounds, rhonchi   Abdomen: Soft, nontender, nondistended, + bowel sounds  Extremities: warm dry without cyanosis clubbing or edema  Neuro: AAOx3,nonfocal  Psych: Normal affect and demeanor with intact judgement and insight  Data Review   Micro Results No results found for this or any previous visit (from the past 240 hour(s)).  Radiology Reports Dg Chest 2 View  11/14/2014   CLINICAL DATA:  Chest pain, shortness of breath for 3 days  EXAM: CHEST  2 VIEW  COMPARISON:  02/08/2014  FINDINGS: The heart size and mediastinal contours are within normal limits. Both lungs are clear. The visualized skeletal structures are unremarkable.  IMPRESSION: No active cardiopulmonary disease.   Electronically Signed   By: Elige Ko   On:  11/14/2014 11:11   Dg Finger Thumb Right  11/03/2014   CLINICAL DATA:  Status post fall in the tuft and hurt the right thumb this morning.  EXAM: RIGHT THUMB 2+V  COMPARISON:  None.  FINDINGS: There is subtle radiopaque lucency identified at the base of the right first distal phalanx suspicious for fracture. There is no dislocation.  IMPRESSION: Subtle radiolucency identified the base of the right first distal phalanx suspicious for fracture.   Electronically Signed   By: Sherian Rein M.D.   On: 11/03/2014 12:24    CBC  Recent Labs Lab 11/14/14 0947 11/14/14 1942 11/15/14 0611  WBC 9.9 9.1 8.2  HGB 13.6 11.6* 12.2  HCT 40.3 35.5* 37.0  PLT 248 246 248  MCV 87.2 87.4 87.9  MCH 29.4 28.6 29.0  MCHC 33.7 32.7 33.0  RDW 14.8 14.8 14.9    Chemistries   Recent Labs Lab 11/14/14 1015 11/14/14 1942 11/15/14 0611  NA 138  --  140  K 3.3*  --  4.1  CL 104  --  112  CO2 23  --  19  GLUCOSE 158*  --  168*  BUN 9  --  <5*  CREATININE 0.96 0.69 0.63  CALCIUM 9.6  --  9.1  AST 31  --   --   ALT 15  --   --   ALKPHOS 33*  --   --   BILITOT 0.3  --   --    ------------------------------------------------------------------------------------------------------------------ estimated creatinine clearance is 91.8 mL/min (by C-G formula based on Cr of 0.63). ------------------------------------------------------------------------------------------------------------------ No results for input(s): HGBA1C in the last 72 hours. ------------------------------------------------------------------------------------------------------------------ No results for input(s): CHOL, HDL, LDLCALC, TRIG, CHOLHDL, LDLDIRECT in the last 72 hours. ------------------------------------------------------------------------------------------------------------------ No results for input(s): TSH, T4TOTAL, T3FREE, THYROIDAB in the last 72 hours.  Invalid input(s):  FREET3 ------------------------------------------------------------------------------------------------------------------ No results for input(s): VITAMINB12, FOLATE, FERRITIN, TIBC, IRON, RETICCTPCT in the last 72 hours.  Coagulation profile No results for input(s): INR, PROTIME in the last 168 hours.   Recent Labs  11/14/14 1024  DDIMER 0.28    Cardiac Enzymes  Recent Labs Lab 11/14/14 1942 11/14/14 2355 11/15/14 0611  TROPONINI <0.03 <0.03 <0.03   ------------------------------------------------------------------------------------------------------------------ Invalid input(s): POCBNP    Laquida Cotrell D.O. on 11/15/2014 at 11:19 AM  Between 7am to 7pm - Pager - [phone]  After 7pm go to [email or web address] - password [email or web address]  And look for the night coverage person covering for me after hours  [facility] Office  [phone]

## 2014-11-15 NOTE — Progress Notes (Signed)
UR Completed.  [phone]  

## 2014-11-15 NOTE — Evaluation (Signed)
Occupational Therapy Evaluation Patient Details Name: [name] MRN: [id number] DOB: [birth year] Today's Date: 11/15/2014    History of Present Illness Pt admitted with shortness of breath, wheezing and chest tightness.  PMH: bipolar, fibromyalgia, interstitial lung disease, current smoker.   Clinical Impression   Pt is performing ADL and ADL transfers at a modified independent level which is her baseline.  She requires periodic rest breaks due to shortness of breath. Will benefit from a shower seat for energy conservation. Instructed pt in energy conservation strategies and benefits of smoking cessation.  No further OT needs.    Follow Up Recommendations  No OT follow up    Equipment Recommendations  Tub/shower seat (shower seat with back-pt has Medicaid)    Recommendations for Other Services       Precautions / Restrictions Precautions Precautions: None      Mobility Bed Mobility Overal bed mobility: Independent                Transfers Overall transfer level: Independent Equipment used: None                  Balance                                            ADL Overall ADL's : Modified independent                                       General ADL Comments: Performs at a slower pace, but no physical assist required.  Educated pt in energy conservation and recommended shower seat.     Vision                     Perception     Praxis      Pertinent Vitals/Pain Pain Assessment: Faces Faces Pain Scale: Hurts little more Pain Location: all over Pain Descriptors / Indicators: Sore Pain Intervention(s): Monitored during session;Repositioned     Hand Dominance Right   Extremity/Trunk Assessment Upper Extremity Assessment Upper Extremity Assessment: Overall WFL for tasks assessed   Lower Extremity Assessment Lower Extremity Assessment: Overall WFL for tasks assessed   Cervical / Trunk  Assessment Cervical / Trunk Assessment: Normal   Communication Communication Communication: No difficulties   Cognition Arousal/Alertness: Awake/alert Behavior During Therapy: WFL for tasks assessed/performed Overall Cognitive Status: Within Functional Limits for tasks assessed                     General Comments       Exercises       Shoulder Instructions      Home Living Family/patient expects to be discharged to:: Private residence Living Arrangements: Spouse/significant other;Children Available Help at Discharge: Family;Available PRN/intermittently (family works) Type of Home: House Home Access: Level entry     Home Layout: One level     Bathroom Shower/Tub: Chief Strategy Officer: Handicapped height     Home Equipment: None          Prior Functioning/Environment Level of Independence: Independent        Comments: pt reports requiring many rest breaks due to shortness of breath at home    OT Diagnosis:     OT Problem List:     OT Treatment/Interventions:  OT Goals(Current goals can be found in the care plan section) Acute Rehab OT Goals Patient Stated Goal: home   OT Frequency:     Barriers to D/C:            Co-evaluation              End of Session Equipment Utilized During Treatment: Oxygen Nurse Communication:  (IV alarming/complete)  Activity Tolerance: Patient tolerated treatment well Patient left: in bed;with call bell/phone within reach   Time: 0940-1010 OT Time Calculation (min): 30 min Charges:  OT General Charges $OT Visit: 1 Procedure OT Evaluation $Initial OT Evaluation Tier I: 1 Procedure OT Treatments $Self Care/Home Management : 8-22 mins G-Codes:    Evern BioMayberry, Trang Bouse Lynn 11/15/2014, 10:12 AM  [phone]

## 2014-11-16 DIAGNOSIS — R112 Nausea with vomiting, unspecified: Secondary | ICD-10-CM | POA: Insufficient documentation

## 2014-11-16 DIAGNOSIS — R0602 Shortness of breath: Secondary | ICD-10-CM | POA: Insufficient documentation

## 2014-11-16 LAB — URINE CULTURE
Colony Count: NO GROWTH
Culture: NO GROWTH

## 2014-11-16 MED ORDER — AZITHROMYCIN 250 MG PO TABS: 250.0000 mg | ORAL_TABLET | Freq: Every day | ORAL | Status: DC

## 2014-11-16 MED ORDER — BENZONATATE 100 MG PO CAPS: 100.0000 mg | ORAL_CAPSULE | Freq: Three times a day (TID) | ORAL | Status: DC

## 2014-11-16 MED ORDER — PREDNISONE (PAK) 10 MG PO TABS: ORAL_TABLET | Freq: Every day | ORAL | Status: DC

## 2014-11-16 MED ORDER — NICOTINE 21 MG/24HR TD PT24: 21.0000 mg | MEDICATED_PATCH | Freq: Every day | TRANSDERMAL | Status: DC

## 2014-11-16 NOTE — Discharge Summary (Signed)
Physician Discharge Summary  [name] ZOX:[id number] DOB: 10-Sep-1966 DOA: 11/14/2014  PCP: Thayer HeadingsMACKENZIE,BRIAN, MD  Admit date: 11/14/2014 Discharge date: 11/16/2014  Time spent: 45 minutes  Recommendations for Outpatient Follow-up:  Patient will discharge to home. She is to follow-up with her primary care physician within one week of discharge. Patient to continue taking medications as prescribed. Patient should abstain from smoking. Patient should resume a normal diet and continue activity as tolerated.  Discharge Diagnoses:  Acute hypoxic respiratory failure with acute COPD exacerbation Bipolar disorder Tobacco abuse Chronic pain Hypokalemia Abnormal EKG  Discharge Condition: Stable  Diet recommendation: Regular  Filed Weights   11/14/14 1801 11/14/14 2154 11/15/14 2105  Weight: 91.4 kg (201 lb 8 oz) 92.2 kg (203 lb 4.2 oz) 89.585 kg (197 lb 8 oz)    History of present illness:  on 11/14/2014 by Dr. Herma Carsonipu Rai Patient is a 49 year old female with bipolar disorder, depression on multiple psych medications, and his initial lung disease, fibromyalgia, presented to ED shortness breath, coughing and wheezing and chest tightness since yesterday. Patient reported subjective fevers and chills, increasing shortness of breath, wheezing and productive cough with greenish sputum. Patient tried albuterol inhaler at home with minimal relief. She also complained of one episode of diarrhea, vomiting but no abdominal pain, dysuria, hematuria, hematochezia, melena or hematemesis. She also reported intermittent chest tightness, worse with activity and coughing. In ED, patient was hypoxic, O2 sats 88%, still wheezing, hospitalist service called for admission.  Hospital Course:  Acute hypoxic Respiratory Failure with acute COPD Exacerbation -Admission, patient was satting at 98% on 2 L -Patient states her shortness of breath has not improved -Initially placed on IV Solu-Medrol, bronchodilators,  oxygen to maintain saturations above 92%, flutter valve, azithromycin and Rocephin -Will discharge patient with steroid taper, zpack -O2 sats monitored during ambulation, and were 92% without oxygen -Patient instructed to quit smoking  Bipolar disorder -Continue home medications: Adderall, Xanax, Fanapt -Prozac held due to prolonged QTC but may resume upon discharge  Tobacco abuse -Patient counseled on smoking cessation, continue nicotine patch  Chronic pain -Continue home regimen  Hypokalemia -Resolved  Abnormal EKG -QTC prolonged at 593 -Prozac held, will avoid fluoroquinolones -Repeat EKG showed resolution, QTc406 -Patient denies chest pain  Procedures: None  Consultations: None  Discharge Exam: Filed Vitals:   11/16/14 0856  BP: 141/69  Pulse: 53  Temp: 97.6 F (36.4 C)  Resp: 18     General: Well developed, well nourished, NAD, appears stated age  HEENT: NCAT, mucous membranes moist.  Cardiovascular: S1 S2 auscultated, no rubs, murmurs or gallops. Regular rate and rhythm.  Respiratory: Clear to auscultation bilaterally with equal chest rise, few exp wheezes  Abdomen: Soft, nontender, nondistended, + bowel sounds  Extremities: warm dry without cyanosis clubbing or edema  Neuro: AAOx3, nonfocal  Psych: Appropriate  Discharge Instructions      Discharge Instructions    Discharge instructions    Complete by:  As directed   Patient will discharge to home. She is to follow-up with her primary care physician within one week of discharge. Patient to continue taking medications as prescribed. Patient should abstain from smoking. Patient should resume a normal diet and continue activity as tolerated.            Medication List    STOP taking these medications        formoterol 12 MCG capsule for inhaler  Commonly known as:  FORADIL     ibuprofen 800 MG tablet  Commonly  known as:  ADVIL,MOTRIN     naproxen 375 MG tablet  Commonly known as:   NAPROSYN      TAKE these medications        albuterol 108 (90 BASE) MCG/ACT inhaler  Commonly known as:  PROVENTIL HFA;VENTOLIN HFA  Inhale 2 puffs into the lungs every 6 (six) hours as needed for wheezing or shortness of breath.     ALPRAZolam 1 MG tablet  Commonly known as:  XANAX  Take 1 mg by mouth 4 (four) times daily.     amphetamine-dextroamphetamine 20 MG 24 hr capsule  Commonly known as:  ADDERALL XR  Take 20 mg by mouth daily.     azithromycin 250 MG tablet  Commonly known as:  ZITHROMAX  Take 1 tablet (250 mg total) by mouth daily.     benzonatate 100 MG capsule  Commonly known as:  TESSALON  Take 1 capsule (100 mg total) by mouth 3 (three) times daily.     FANAPT 6 MG Tabs  Generic drug:  Iloperidone  Take 6 mg by mouth 3 (three) times daily.     FLUoxetine 40 MG capsule  Commonly known as:  PROZAC  Take 40 mg by mouth 2 (two) times daily.     lamoTRIgine 200 MG tablet  Commonly known as:  LAMICTAL  Take 200 mg by mouth 2 (two) times daily.     nicotine 21 mg/24hr patch  Commonly known as:  NICODERM CQ - dosed in mg/24 hours  Place 1 patch (21 mg total) onto the skin daily.     OPANA ER (CRUSH RESISTANT) 20 MG T12a  Generic drug:  Oxymorphone HCl (Crush Resist)  Take 20 mg by mouth every 12 (twelve) hours.     oxyCODONE-acetaminophen 5-325 MG per tablet  Commonly known as:  PERCOCET/ROXICET  Take 1 tablet by mouth every 6 (six) hours as needed for severe pain.     predniSONE 10 MG tablet  Commonly known as:  STERAPRED UNI-PAK  Take by mouth daily. Prednisone dosing: Take  Prednisone  (4 tabs) x 3 days, then taper to  (3 tabs) x 3 days, then  (2 tabs) x 3days, then  (1 tab) x 3days, then stop     pregabalin 225 MG capsule  Commonly known as:  LYRICA  Take 225 mg by mouth 2 (two) times daily.     rizatriptan 10 MG tablet  Commonly known as:  MAXALT  Take 20 mg by mouth daily as needed for migraine. May repeat in 2 hours if needed      tiZANidine 4 MG tablet  Commonly known as:  ZANAFLEX  Take 4 mg by mouth every 6 (six) hours as needed for muscle spasms.     Umeclidinium-Vilanterol 62.5-25 MCG/INH Aepb  Inhale 1 puff into the lungs daily.     zolpidem 10 MG tablet  Commonly known as:  AMBIEN  Take 10 mg by mouth at bedtime.       Allergies  Allergen Reactions  . Cortisone Acetate Shortness Of Breath and Other (See Comments)    Makes patient feel as if she is choking.  . Vicodin [Hydrocodone-Acetaminophen] Itching   Follow-up Information    Follow up with Thayer Headings, MD. Schedule an appointment as soon as possible for a visit in 1 week.   Specialty:  Internal Medicine   Why:  Hospital followup   Contact information:   [address] [place] [address] [phone]  The results of significant diagnostics from this hospitalization (including imaging, microbiology, ancillary and laboratory) are listed below for reference.    Significant Diagnostic Studies: Dg Chest 2 View  11/14/2014   CLINICAL DATA:  Chest pain, shortness of breath for 3 days  EXAM: CHEST  2 VIEW  COMPARISON:  02/08/2014  FINDINGS: The heart size and mediastinal contours are within normal limits. Both lungs are clear. The visualized skeletal structures are unremarkable.  IMPRESSION: No active cardiopulmonary disease.   Electronically Signed   By: Elige Ko   On: 11/14/2014 11:11   Dg Finger Thumb Right  11/03/2014   CLINICAL DATA:  Status post fall in the tuft and hurt the right thumb this morning.  EXAM: RIGHT THUMB 2+V  COMPARISON:  None.  FINDINGS: There is subtle radiopaque lucency identified at the base of the right first distal phalanx suspicious for fracture. There is no dislocation.  IMPRESSION: Subtle radiolucency identified the base of the right first distal phalanx suspicious for fracture.   Electronically Signed   By: Sherian Rein M.D.   On: 11/03/2014 12:24    Microbiology: Recent Results  (from the past 240 hour(s))  Urine culture     Status: None   Collection Time: 11/14/14  7:31 PM  Result Value Ref Range Status   Specimen Description URINE, RANDOM  Final   Special Requests NONE  Final   Colony Count NO GROWTH Performed at [facility]   Final   Culture NO GROWTH Performed at [facility]   Final   Report Status 11/16/2014 FINAL  Final     Labs: Basic Metabolic Panel:  Recent Labs Lab 11/14/14 1015 11/14/14 1942 11/15/14 0611  NA 138  --  140  K 3.3*  --  4.1  CL 104  --  112  CO2 23  --  19  GLUCOSE 158*  --  168*  BUN 9  --  <5*  CREATININE 0.96 0.69 0.63  CALCIUM 9.6  --  9.1   Liver Function Tests:  Recent Labs Lab 11/14/14 1015  AST 31  ALT 15  ALKPHOS 33*  BILITOT 0.3  PROT 7.3  ALBUMIN 3.6   No results for input(s): LIPASE, AMYLASE in the last 168 hours. No results for input(s): AMMONIA in the last 168 hours. CBC:  Recent Labs Lab 11/14/14 0947 11/14/14 1942 11/15/14 0611  WBC 9.9 9.1 8.2  HGB 13.6 11.6* 12.2  HCT 40.3 35.5* 37.0  MCV 87.2 87.4 87.9  PLT 248 246 248   Cardiac Enzymes:  Recent Labs Lab 11/14/14 1942 11/14/14 2355 11/15/14 0611  TROPONINI <0.03 <0.03 <0.03   BNP: BNP (last 3 results) No results for input(s): PROBNP in the last 8760 hours. CBG: No results for input(s): GLUCAP in the last 168 hours.     SignedEdsel Petrin  [facility] 11/16/2014, 10:43 AM

## 2014-11-16 NOTE — Discharge Instructions (Signed)
Smoking Cessation Quitting smoking is important to your health and has many advantages. However, it is not always easy to quit since nicotine is a very addictive drug. Oftentimes, people try 3 times or more before being able to quit. This document explains the best ways for you to prepare to quit smoking. Quitting takes hard work and a lot of effort, but you can do it. ADVANTAGES OF QUITTING SMOKING  You will live longer, feel better, and live better.  Your body will feel the impact of quitting smoking almost immediately.  Within 20 minutes, blood pressure decreases. Your pulse returns to its normal level.  After 8 hours, carbon monoxide levels in the blood return to normal. Your oxygen level increases.  After 24 hours, the chance of having a heart attack starts to decrease. Your breath, hair, and body stop smelling like smoke.  After 48 hours, damaged nerve endings begin to recover. Your sense of taste and smell improve.  After 72 hours, the body is virtually free of nicotine. Your bronchial tubes relax and breathing becomes easier.  After 2 to 12 weeks, lungs can hold more air. Exercise becomes easier and circulation improves.  The risk of having a heart attack, stroke, cancer, or lung disease is greatly reduced.  After 1 year, the risk of coronary heart disease is cut in half.  After 5 years, the risk of stroke falls to the same as a nonsmoker.  After 10 years, the risk of lung cancer is cut in half and the risk of other cancers decreases significantly.  After 15 years, the risk of coronary heart disease drops, usually to the level of a nonsmoker.  If you are pregnant, quitting smoking will improve your chances of having a healthy baby.  The people you live with, especially any children, will be healthier.  You will have extra money to spend on things other than cigarettes. QUESTIONS TO THINK ABOUT BEFORE ATTEMPTING TO QUIT You may want to talk about your answers with your  health care provider.  Why do you want to quit?  If you tried to quit in the past, what helped and what did not?  What will be the most difficult situations for you after you quit? How will you plan to handle them?  Who can help you through the tough times? Your family? Friends? A health care provider?  What pleasures do you get from smoking? What ways can you still get pleasure if you quit? Here are some questions to ask your health care provider:  How can you help me to be successful at quitting?  What medicine do you think would be best for me and how should I take it?  What should I do if I need more help?  What is smoking withdrawal like? How can I get information on withdrawal? GET READY  Set a quit date.  Change your environment by getting rid of all cigarettes, ashtrays, matches, and lighters in your home, car, or work. Do not let people smoke in your home.  Review your past attempts to quit. Think about what worked and what did not. GET SUPPORT AND ENCOURAGEMENT You have a better chance of being successful if you have help. You can get support in many ways.  Tell your family, friends, and coworkers that you are going to quit and need their support. Ask them not to smoke around you.  Get individual, group, or telephone counseling and support. Programs are available at local hospitals and health centers. Call   your local health department for information about programs in your area.  Spiritual beliefs and practices may help some smokers quit.  Download a "quit meter" on your computer to keep track of quit statistics, such as how long you have gone without smoking, cigarettes not smoked, and money saved.  Get a self-help book about quitting smoking and staying off tobacco. LEARN NEW SKILLS AND BEHAVIORS  Distract yourself from urges to smoke. Talk to someone, go for a walk, or occupy your time with a task.  Change your normal routine. Take a different route to work.  Drink tea instead of coffee. Eat breakfast in a different place.  Reduce your stress. Take a hot bath, exercise, or read a book.  Plan something enjoyable to do every day. Reward yourself for not smoking.  Explore interactive web-based programs that specialize in helping you quit. GET MEDICINE AND USE IT CORRECTLY Medicines can help you stop smoking and decrease the urge to smoke. Combining medicine with the above behavioral methods and support can greatly increase your chances of successfully quitting smoking.  Nicotine replacement therapy helps deliver nicotine to your body without the negative effects and risks of smoking. Nicotine replacement therapy includes nicotine gum, lozenges, inhalers, nasal sprays, and skin patches. Some may be available over-the-counter and others require a prescription.  Antidepressant medicine helps people abstain from smoking, but how this works is unknown. This medicine is available by prescription.  Nicotinic receptor partial agonist medicine simulates the effect of nicotine in your brain. This medicine is available by prescription. Ask your health care provider for advice about which medicines to use and how to use them based on your health history. Your health care provider will tell you what side effects to look out for if you choose to be on a medicine or therapy. Carefully read the information on the package. Do not use any other product containing nicotine while using a nicotine replacement product.  RELAPSE OR DIFFICULT SITUATIONS Most relapses occur within the first 3 months after quitting. Do not be discouraged if you start smoking again. Remember, most people try several times before finally quitting. You may have symptoms of withdrawal because your body is used to nicotine. You may crave cigarettes, be irritable, feel very hungry, cough often, get headaches, or have difficulty concentrating. The withdrawal symptoms are only temporary. They are strongest  when you first quit, but they will go away within 10-14 days. To reduce the chances of relapse, try to:  Avoid drinking alcohol. Drinking lowers your chances of successfully quitting.  Reduce the amount of caffeine you consume. Once you quit smoking, the amount of caffeine in your body increases and can give you symptoms, such as a rapid heartbeat, sweating, and anxiety.  Avoid smokers because they can make you want to smoke.  Do not let weight gain distract you. Many smokers will gain weight when they quit, usually less than 10 pounds. Eat a healthy diet and stay active. You can always lose the weight gained after you quit.  Find ways to improve your mood other than smoking. FOR MORE INFORMATION  www.smokefree.gov  Document Released: 10/12/2001 Document Revised: 03/04/2014 Document Reviewed: 01/27/2012 ExitCare Patient Information 2015 [employer]. This information is not intended to replace advice given to you by your health care provider. Make sure you discuss any questions you have with your health care provider. Chronic Obstructive Pulmonary Disease Chronic obstructive pulmonary disease (COPD) is a common lung condition in which airflow from the lungs is limited.   COPD is a general term that can be used to describe many different lung problems that limit airflow, including both chronic bronchitis and emphysema. If you have COPD, your lung function will probably never return to normal, but there are measures you can take to improve lung function and make yourself feel better.  CAUSES   Smoking (common).   Exposure to secondhand smoke.   Genetic problems.  Chronic inflammatory lung diseases or recurrent infections. SYMPTOMS   Shortness of breath, especially with physical activity.   Deep, persistent (chronic) cough with a large amount of thick mucus.   Wheezing.   Rapid breaths (tachypnea).   Gray or bluish discoloration (cyanosis) of the skin, especially in fingers,  toes, or lips.   Fatigue.   Weight loss.   Frequent infections or episodes when breathing symptoms become much worse (exacerbations).   Chest tightness. DIAGNOSIS  Your health care provider will take a medical history and perform a physical examination to make the initial diagnosis. Additional tests for COPD may include:   Lung (pulmonary) function tests.  Chest X-ray.  CT scan.  Blood tests. TREATMENT  Treatment available to help you feel better when you have COPD includes:   Inhaler and nebulizer medicines. These help manage the symptoms of COPD and make your breathing more comfortable.  Supplemental oxygen. Supplemental oxygen is only helpful if you have a low oxygen level in your blood.   Exercise and physical activity. These are beneficial for nearly all people with COPD. Some people may also benefit from a pulmonary rehabilitation program. HOME CARE INSTRUCTIONS   Take all medicines (inhaled or pills) as directed by your health care provider.  Avoid over-the-counter medicines or cough syrups that dry up your airway (such as antihistamines) and slow down the elimination of secretions unless instructed otherwise by your health care provider.   If you are a smoker, the most important thing that you can do is stop smoking. Continuing to smoke will cause further lung damage and breathing trouble. Ask your health care provider for help with quitting smoking. He or she can direct you to community resources or hospitals that provide support.  Avoid exposure to irritants such as smoke, chemicals, and fumes that aggravate your breathing.  Use oxygen therapy and pulmonary rehabilitation if directed by your health care provider. If you require home oxygen therapy, ask your health care provider whether you should purchase a pulse oximeter to measure your oxygen level at home.   Avoid contact with individuals who have a contagious illness.  Avoid extreme temperature and  humidity changes.  Eat healthy foods. Eating smaller, more frequent meals and resting before meals may help you maintain your strength.  Stay active, but balance activity with periods of rest. Exercise and physical activity will help you maintain your ability to do things you want to do.  Preventing infection and hospitalization is very important when you have COPD. Make sure to receive all the vaccines your health care provider recommends, especially the pneumococcal and influenza vaccines. Ask your health care provider whether you need a pneumonia vaccine.  Learn and use relaxation techniques to manage stress.  Learn and use controlled breathing techniques as directed by your health care provider. Controlled breathing techniques include:   Pursed lip breathing. Start by breathing in (inhaling) through your nose for 1 second. Then, purse your lips as if you were going to whistle and breathe out (exhale) through the pursed lips for 2 seconds.   Diaphragmatic breathing. Start by putting   one hand on your abdomen just above your waist. Inhale slowly through your nose. The hand on your abdomen should move out. Then purse your lips and exhale slowly. You should be able to feel the hand on your abdomen moving in as you exhale.   Learn and use controlled coughing to clear mucus from your lungs. Controlled coughing is a series of short, progressive coughs. The steps of controlled coughing are:   Lean your head slightly forward.   Breathe in deeply using diaphragmatic breathing.   Try to hold your breath for 3 seconds.   Keep your mouth slightly open while coughing twice.   Spit any mucus out into a tissue.   Rest and repeat the steps once or twice as needed. SEEK MEDICAL CARE IF:   You are coughing up more mucus than usual.   There is a change in the color or thickness of your mucus.   Your breathing is more labored than usual.   Your breathing is faster than usual.  SEEK  IMMEDIATE MEDICAL CARE IF:   You have shortness of breath while you are resting.   You have shortness of breath that prevents you from:  Being able to talk.   Performing your usual physical activities.   You have chest pain lasting longer than 5 minutes.   Your skin color is more cyanotic than usual.  You measure low oxygen saturations for longer than 5 minutes with a pulse oximeter. MAKE SURE YOU:   Understand these instructions.  Will watch your condition.  Will get help right away if you are not doing well or get worse. Document Released: 07/28/2005 Document Revised: 03/04/2014 Document Reviewed: 06/14/2013 ExitCare Patient Information 2015 [employer]. This information is not intended to replace advice given to you by your health care provider. Make sure you discuss any questions you have with your health care provider.  

## 2014-11-19 ENCOUNTER — Ambulatory Visit: Payer: Medicaid Other

## 2014-11-19 NOTE — Telephone Encounter (Signed)
Formed signed, and on my desk.

## 2014-11-19 NOTE — Telephone Encounter (Signed)
PA form faxed to ins  Form given to Ashtyn and will forward to her for f/u

## 2014-11-20 ENCOUNTER — Telehealth: Payer: Self-pay | Admitting: Pulmonary Disease

## 2014-11-20 NOTE — Telephone Encounter (Signed)
Called patient and pharmacy-both are aware of approval.

## 2014-11-20 NOTE — Telephone Encounter (Signed)
PA approved as of 11-19-14 [facility] [employer] -see phone note 11-20-14. Both patient and pharmacy aware.

## 2014-11-27 ENCOUNTER — Ambulatory Visit: Payer: Medicare Other

## 2014-12-01 ENCOUNTER — Other Ambulatory Visit: Payer: Self-pay | Admitting: Pulmonary Disease

## 2014-12-05 ENCOUNTER — Encounter (HOSPITAL_COMMUNITY): Payer: Self-pay | Admitting: Emergency Medicine

## 2014-12-05 ENCOUNTER — Ambulatory Visit: Payer: Medicare Other | Attending: Orthopaedic Surgery

## 2014-12-05 ENCOUNTER — Telehealth: Payer: Self-pay | Admitting: *Deleted

## 2014-12-05 ENCOUNTER — Emergency Department (HOSPITAL_COMMUNITY): Payer: Medicare Other

## 2014-12-05 DIAGNOSIS — M25512 Pain in left shoulder: Secondary | ICD-10-CM | POA: Diagnosis present

## 2014-12-05 DIAGNOSIS — G8929 Other chronic pain: Secondary | ICD-10-CM | POA: Diagnosis not present

## 2014-12-05 DIAGNOSIS — Z872 Personal history of diseases of the skin and subcutaneous tissue: Secondary | ICD-10-CM | POA: Diagnosis not present

## 2014-12-05 DIAGNOSIS — M1990 Unspecified osteoarthritis, unspecified site: Secondary | ICD-10-CM | POA: Insufficient documentation

## 2014-12-05 DIAGNOSIS — M797 Fibromyalgia: Secondary | ICD-10-CM | POA: Diagnosis not present

## 2014-12-05 DIAGNOSIS — Z79899 Other long term (current) drug therapy: Secondary | ICD-10-CM | POA: Diagnosis not present

## 2014-12-05 DIAGNOSIS — Z720 Tobacco use: Secondary | ICD-10-CM | POA: Insufficient documentation

## 2014-12-05 DIAGNOSIS — G43909 Migraine, unspecified, not intractable, without status migrainosus: Secondary | ICD-10-CM | POA: Diagnosis not present

## 2014-12-05 DIAGNOSIS — Z7952 Long term (current) use of systemic steroids: Secondary | ICD-10-CM | POA: Insufficient documentation

## 2014-12-05 DIAGNOSIS — F319 Bipolar disorder, unspecified: Secondary | ICD-10-CM | POA: Diagnosis not present

## 2014-12-05 DIAGNOSIS — R0602 Shortness of breath: Secondary | ICD-10-CM | POA: Diagnosis present

## 2014-12-05 DIAGNOSIS — R29898 Other symptoms and signs involving the musculoskeletal system: Secondary | ICD-10-CM

## 2014-12-05 DIAGNOSIS — J441 Chronic obstructive pulmonary disease with (acute) exacerbation: Secondary | ICD-10-CM | POA: Insufficient documentation

## 2014-12-05 NOTE — Telephone Encounter (Signed)
APPTS MADE AND PRINTED...TD 

## 2014-12-05 NOTE — ED Notes (Signed)
Pt. reports persistent productive cough , SOB and chest congestion onset this week , denies fever or chills.

## 2014-12-05 NOTE — Therapy (Addendum)
[place], [place], [address] Phone: [phone]   Fax:  [phone]  Physical Therapy Evaluation  Patient Details  Name: [name] MRN: [id number] Date of Birth: 10/01/1966 Referring Provider:  Marianna Payment, MD  Encounter Date: 12/05/2014      PT End of Session - 12/05/14 1451    Visit Number 1   Number of Visits 12   Date for PT Re-Evaluation 01/18/15   PT Start Time 1583   PT Stop Time 1450   PT Time Calculation (min) 35 min   Activity Tolerance Patient tolerated treatment well   Behavior During Therapy [facility] for tasks assessed/performed      Past Medical History  Diagnosis Date  . Abscess     rectal  . Depression   . Fibromyalgia   . Ovarian cyst   . Endometriosis   . Pulmonary infiltrates   . Dyspnea   . COPD (chronic obstructive pulmonary disease)   . Emphysema lung   . Chronic bronchitis     "I get it q yr" (11/14/2014)  . Migraine     "3 in the last 2 wks" (11/14/2014)  . Arthritis     "back; hips" (11/14/2014)  . Chronic back pain     "all over"  . Bipolar disorder   . Anxiety   . ILD (interstitial lung disease) 11/2013    Past Surgical History  Procedure Laterality Date  . Appendectomy  1993  . Laparoscopic cholecystectomy  08/2007  . Examination under anesthesia  02/15/2012    Procedure: EXAM UNDER ANESTHESIA;  Surgeon: Marcello Moores A. Cornett, MD;  Location: [facility];  Service: General;  Laterality: Left;  Excision of perineal cyst  . Video bronchoscopy Bilateral 02/15/2014    Procedure: VIDEO BRONCHOSCOPY WITH FLUORO;  Surgeon: Chesley Mires, MD;  [facility];  Service: Cardiopulmonary;  Laterality: Bilateral;  . Laparoscopic lysis of adhesions  06/2010    Archie Endo 06/18/2010  . Transobturator sling  09/2008    Transobturator mid urethral sling, cystoscopy, laparoscopy w/LOA/notes 03/04/2011  . Cyst excision perineal Left 01/2012    Archie Endo  02/15/2012  . Breast biopsy Left 2008  . Breast lumpectomy Left 2008    "benign"  . Vaginal hysterectomy  1993  . Tubal ligation  1993  . Ovarian cyst removal  X 2-3  . Left oophorectomy Left 08/2013    There were no vitals taken for this visit.  Visit Diagnosis:  Left shoulder pain - Plan: PT plan of care cert/re-cert  Shoulder weakness - Plan: PT plan of care cert/re-cert      Subjective Assessment - 12/05/14 1416    Symptoms LT shoulder pain.    Pertinent History No injury . She reports RT shoulder surgery for decompression with DCR. She says the pain in LT shoulder feels the same   Limitations Lifting;House hold activities  Pain with activity   How long can you sit comfortably? As needed. She leans on LT shoulder to ease pain   Diagnostic tests None   Patient Stated Goals Decreased pain in LT shoulder.    Currently in Pain? Yes   Pain Score 7    Pain Location Shoulder   Pain Orientation Left   Pain Descriptors / Indicators Aching   Pain Type Chronic pain   Pain Onset More than a month ago   Pain Frequency Intermittent   Aggravating Factors  Using Lt arm, lifting   Pain Relieving Factors meication, cold  Effect of Pain on Daily Activities Pain with use of arm   Multiple Pain Sites No          [facility] PT Assessment - 12/05/14 1411    Assessment   Medical Diagnosis LT shoulder pain , RTC syndrome   Onset Date --  90 days at least   Prior Therapy no   Precautions   Precautions None   Restrictions   Weight Bearing Restrictions No   Balance Screen   Has the patient fallen in the past 6 months Yes   How many times? 1  slipped in tub and fractured thumb   Has the patient had a decrease in activity level because of a fear of falling?  No   Is the patient reluctant to leave their home because of a fear of falling?  No   Observation/Other Assessments   Focus on Therapeutic Outcomes (FOTO)  Limited 51%, prredicted 34%   Posture/Postural Control   Posture Comments  Rounded shoulders   AROM   Right Shoulder Extension 45 Degrees   Right Shoulder Flexion 125 Degrees   Right Shoulder ABduction 155 Degrees   Right Shoulder Internal Rotation 56 Degrees   Right Shoulder External Rotation 74 Degrees   Left Shoulder Extension 35 Degrees   Left Shoulder Flexion 135 Degrees   Left Shoulder ABduction 110 Degrees   Left Shoulder Internal Rotation 55 Degrees   Left Shoulder External Rotation 68 Degrees   Strength   Right Shoulder Extension 4+/5   Right Shoulder ABduction 4+/5   Right Shoulder Internal Rotation 4+/5   Right Shoulder External Rotation 4+/5   Right Shoulder Horizontal ABduction 4+/5   Right Shoulder Horizontal ADduction 4+/5   Left Shoulder Flexion 4/5   Left Shoulder Extension 4/5   Left Shoulder ABduction 4/5   Left Shoulder Internal Rotation 4/5   Left Shoulder External Rotation 4/5   Left Shoulder Horizontal ABduction 4/5   Left Shoulder Horizontal ADduction 4/5                  [facility] Adult PT Treatment/Exercise - 12/05/14 1411    Exercises   Exercises Shoulder   Shoulder Exercises: Seated   Retraction AROM;Both;12 reps   Manual Therapy   Manual Therapy --  kineseotape LT shoulde Y ant/post and i top to scapula                PT Education - 12/05/14 1436    Education provided Yes   Person(s) Educated Patient   Methods Explanation;Demonstration;Tactile cues;Verbal cues;Handout   Comprehension Verbalized understanding;Returned demonstration          PT Short Term Goals - 12/05/14 1454    PT SHORT TERM GOAL #1   Title she will be independent wth the inital HEP   Time 3   Period Weeks   Status New   PT SHORT TERM GOAL #2   Title she will report 305 or more decrease in pain.   Time 3   Period Weeks   Status New           PT Long Term Goals - 12/05/14 1456    PT LONG TERM GOAL #1   Title She will be independent with HEP issued as of last visit   Period Weeks   Status New   PT LONG TERM GOAL  #2   Title pain inproved 50% or mroe with home tasks   Time 6   Period Weeks   Status New   PT  LONG TERM GOAL #3   Title She will be able to carry groceries in LT arm from car to home with 2-3 max pain   Time 6   Period Weeks   Status New               Plan - 12/15/2014 1452    Clinical Impression Statement Pain limiting functional use of LT arm   Pt will benefit from skilled therapeutic intervention in order to improve on the following deficits Impaired UE functional use;Pain;Decreased strength;Decreased activity tolerance;Increased muscle spasms   Rehab Potential Good   PT Frequency 2x / week   PT Duration 6 weeks   PT Treatment/Interventions Moist Heat;Ultrasound;Electrical Stimulation;Cryotherapy;Manual techniques;Dry needling;Passive range of motion;Therapeutic exercise;Therapeutic activities;Patient/family education  iontophoresis   PT Next Visit Plan isometrics, STW, modalities   PT Home Exercise Plan isometrics   Consulted and Agree with Plan of Care Patient          G-Codes - 15-Dec-2014 1412    Functional Assessment Tool Used FOTO   Functional Limitation Carrying, moving and handling objects   Carrying, Moving and Handling Objects Current Status [phone]) At least 40 percent but less than 60 percent impaired, limited or restricted   Carrying, Moving and Handling Objects Goal Status ([id number]) At least 20 percent but less than 40 percent impaired, limited or restricted       Problem List Patient Active Problem List   Diagnosis Date Noted  . Nausea with vomiting   . SOB (shortness of breath)   . COPD with acute exacerbation 11/14/2014  . Hypokalemia 11/14/2014  . Acute respiratory failure 11/14/2014  . COPD exacerbation 11/14/2014  . Abnormal EKG 11/14/2014  . COPD with emphysema 06/17/2014  . Leg swelling 06/17/2014  . Fatigue 06/17/2014  . Tobacco abuse 01/20/2014  . Pulmonary infiltrate 01/11/2014  . MIXED INCONTINENCE URGE AND STRESS 09/19/2007  .  Bipolar disorder 06/26/2007  . FIBROMYALGIA 06/26/2007  . INSOMNIA 06/26/2007    Darrel Hoover PT 15-Dec-2014, 2:59 PM  [facility] [address] [place], [place], [address] Phone: [phone]   Fax:  [phone]    PHYSICAL THERAPY DISCHARGE SUMMARY  Visits from Start of Care: Evaluation only  Current functional level related to goals / functional outcomes: Unknown as she did not return after evaluation   Remaining deficits: Unknown   Education / Equipment: NA  Plan:                                                    Patient goals were not met. Patient is being discharged due to not returning since the last visit.  ?????

## 2014-12-05 NOTE — Patient Instructions (Addendum)
SCAPULA: Retraction   Hold cane with both hands. Pinch shoulder blades together. Do not shrug shoulders. Hold __3_ seconds. Use___ lb weight on cane. __3-5_ reps per set, __6-8_ sets per day, _7__ days per week  Copyright  VHI. All rights reserved.  Posture - Sitting   Sit upright, head facing forward. Try using a roll to support lower back. Keep shoulders relaxed, and avoid rounded back. Keep hips level with knees. Avoid crossing legs for long periods.   Copyright  VHI. All rights reserved.  . Reminder to remove kineseotape in 3 days or if irritating.

## 2014-12-06 ENCOUNTER — Emergency Department (HOSPITAL_COMMUNITY)
Admission: EM | Admit: 2014-12-06 | Discharge: 2014-12-06 | Disposition: A | Discharge status: Home / Self Care | Payer: Medicare Other | Attending: Emergency Medicine | Admitting: Emergency Medicine

## 2014-12-06 DIAGNOSIS — R0600 Dyspnea, unspecified: Secondary | ICD-10-CM

## 2014-12-06 DIAGNOSIS — R0602 Shortness of breath: Secondary | ICD-10-CM

## 2014-12-06 DIAGNOSIS — J449 Chronic obstructive pulmonary disease, unspecified: Secondary | ICD-10-CM

## 2014-12-06 DIAGNOSIS — R059 Cough, unspecified: Secondary | ICD-10-CM

## 2014-12-06 DIAGNOSIS — R05 Cough: Secondary | ICD-10-CM

## 2014-12-06 NOTE — ED Notes (Signed)
Patient is alert and orientedx4.  Patient was explained discharge instructions and they understood them with no questions.  The patient's husband, [name] is taking the patient home.

## 2014-12-06 NOTE — ED Provider Notes (Signed)
CSN: [id number]     Arrival date & time 12/05/14  2305 History  This chart was scribed for Joya Gaskins, MD by Richarda Overlie, ED Scribe. This patient was seen in room D31C/D31C and the patient's care was started 1:04 AM.     Chief Complaint  Patient presents with  . Cough  . Shortness of Breath   Patient is a 49 y.o. female presenting with cough and shortness of breath. The history is provided by the patient. No language interpreter was used.  Cough Associated symptoms: shortness of breath   Associated symptoms: no chest pain, no chills and no fever   Shortness of Breath Severity:  Mild Onset quality:  Unable to specify Duration:  1 week Timing:  Unable to specify Progression:  Improving Relieved by:  Inhaler Associated symptoms: cough   Associated symptoms: no chest pain, no fever, no hemoptysis and no vomiting    HPI Comments: [name] is a 49 y.o. female with a history of COPD who presents to the Emergency Department complaining of partially resolved SOB that started this past week. She reports associated productive cough with chest congestion. Pt states that she had a breathing treatment prior to coming to the ED tonight which provided relief to her symtpoms. She reports a history of smoking but says she is going to quit.Pt is saying that she wants to go home at this time. She denies fever, vomiting, diarrhea and CP.   Past Medical History  Diagnosis Date  . Abscess     rectal  . Depression   . Fibromyalgia   . Ovarian cyst   . Endometriosis   . Pulmonary infiltrates   . Dyspnea   . COPD (chronic obstructive pulmonary disease)   . Emphysema lung   . Chronic bronchitis     "I get it q yr" (11/14/2014)  . Migraine     "3 in the last 2 wks" (11/14/2014)  . Arthritis     "back; hips" (11/14/2014)  . Chronic back pain     "all over"  . Bipolar disorder   . Anxiety   . ILD (interstitial lung disease) 11/2013   Past Surgical History  Procedure Laterality Date   . Appendectomy  1993  . Laparoscopic cholecystectomy  08/2007  . Examination under anesthesia  02/15/2012    Procedure: EXAM UNDER ANESTHESIA;  Surgeon: Maisie Fus A. Cornett, MD;  Location: [facility];  Service: General;  Laterality: Left;  Excision of perineal cyst  . Video bronchoscopy Bilateral 02/15/2014    Procedure: VIDEO BRONCHOSCOPY WITH FLUORO;  Surgeon: Coralyn Helling, MD;  [facility];  Service: Cardiopulmonary;  Laterality: Bilateral;  . Laparoscopic lysis of adhesions  06/2010    Hattie Perch 06/18/2010  . Transobturator sling  09/2008    Transobturator mid urethral sling, cystoscopy, laparoscopy w/LOA/notes 03/04/2011  . Cyst excision perineal Left 01/2012    Hattie Perch 02/15/2012  . Breast biopsy Left 2008  . Breast lumpectomy Left 2008    "benign"  . Vaginal hysterectomy  1993  . Tubal ligation  1993  . Ovarian cyst removal  X 2-3  . Left oophorectomy Left 08/2013   Family History  Problem Relation Age of Onset  . Cancer Father     lung  . COPD Father   . Early death Father   . Other Neg Hx   . Hypertension Sister   . Cancer Maternal Aunt     LUNG CANCER   History  Substance Use Topics  .  Smoking status: Current Every Day Smoker -- 1.00 packs/day for 32 years    Types: Cigarettes  . Smokeless tobacco: Never Used  . Alcohol Use: No   OB History    Gravida Para Term Preterm AB TAB SAB Ectopic Multiple Living   0 0 0 0 0 0 2     Review of Systems  Constitutional: Negative for fever and chills.  Respiratory: Positive for cough and shortness of breath. Negative for hemoptysis.   Cardiovascular: Negative for chest pain.  Gastrointestinal: Negative for vomiting and diarrhea.  All other systems reviewed and are negative.   Allergies  Cortisone acetate and Vicodin  Home Medications   Prior to Admission medications   Medication Sig Start Date End Date Taking? Authorizing Provider  albuterol (PROVENTIL HFA;VENTOLIN HFA) 108 (90 BASE) MCG/ACT  inhaler Inhale 2 puffs into the lungs every 6 (six) hours as needed for wheezing or shortness of breath. 11/04/14   Coralyn Helling, MD  ALPRAZolam Prudy Feeler) 1 MG tablet Take 1 mg by mouth 4 (four) times daily.     Historical Provider, MD  amphetamine-dextroamphetamine (ADDERALL XR) 20 MG 24 hr capsule Take 20 mg by mouth daily. 10/16/14   Historical Provider, MD  ANORO ELLIPTA 62.5-25 MCG/INH AEPB INHALE 1 PUFF BY MOUTH DAILY 12/05/14   Coralyn Helling, MD  azithromycin (ZITHROMAX) 250 MG tablet Take 1 tablet (250 mg total) by mouth daily. 11/16/14   Maryann Mikhail, DO  benzonatate (TESSALON) 100 MG capsule Take 1 capsule (100 mg total) by mouth 3 (three) times daily. 11/16/14   Maryann Mikhail, DO  FANAPT 6 MG TABS Take 6 mg by mouth 3 (three) times daily.  02/26/14   Historical Provider, MD  FLUoxetine (PROZAC) 40 MG capsule Take 40 mg by mouth 2 (two) times daily.     Historical Provider, MD  lamoTRIgine (LAMICTAL) 200 MG tablet Take 200 mg by mouth 2 (two) times daily.  02/17/14   Historical Provider, MD  nicotine (NICODERM CQ - DOSED IN MG/24 HOURS) 21 mg/24hr patch Place 1 patch (21 mg total) onto the skin daily. 11/16/14   Maryann Mikhail, DO  OPANA ER, CRUSH RESISTANT, 20 MG T12A Take 20 mg by mouth every 12 (twelve) hours. 11/04/14   Historical Provider, MD  oxyCODONE-acetaminophen (PERCOCET/ROXICET) 5-325 MG per tablet Take 1 tablet by mouth every 6 (six) hours as needed for severe pain. 11/03/14   Jamesetta Orleans Lawyer, PA-C  predniSONE (STERAPRED UNI-PAK) 10 MG tablet Take by mouth daily. Prednisone dosing: Take  Prednisone  (4 tabs) x 3 days, then taper to  (3 tabs) x 3 days, then  (2 tabs) x 3days, then  (1 tab) x 3days, then stop 11/16/14   Maryann Mikhail, DO  pregabalin (LYRICA) 225 MG capsule Take 225 mg by mouth 2 (two) times daily.    Historical Provider, MD  rizatriptan (MAXALT) 10 MG tablet Take 20 mg by mouth daily as needed for migraine. May repeat in 2 hours if needed    Historical  Provider, MD  tiZANidine (ZANAFLEX) 4 MG tablet Take 4 mg by mouth every 6 (six) hours as needed for muscle spasms.    Historical Provider, MD  Umeclidinium-Vilanterol 62.5-25 MCG/INH AEPB Inhale 1 puff into the lungs daily.    Historical Provider, MD  zolpidem (AMBIEN) 10 MG tablet Take 10 mg by mouth at bedtime.  02/25/14   Historical Provider, MD   BP 114/65 mmHg  Pulse 66  Temp(Src) 98.6 F (37 C) (Oral)  Resp 20  SpO2 96% Physical Exam CONSTITUTIONAL: Well developed/well nourished HEAD: Normocephalic/atraumatic EYES: EOMI/PERRL ENMT: Mucous membranes moist NECK: supple no meningeal signs SPINE/BACK:entire spine nontender CV: S1/S2 noted, no murmurs/rubs/gallops noted LUNGS: Coarse breath sounds bilaterally. No distress noted.  ABDOMEN: soft, nontender, no rebound or guarding, bowel sounds noted throughout abdomen GU:no cva tenderness NEURO: Pt is awake/alert/appropriate, moves all extremitiesx4.  No facial droop.   EXTREMITIES: pulses normal/equal, full ROM SKIN: warm, color normal PSYCH: no abnormalities of mood noted, alert and oriented to situation  ED Course  Procedures   DIAGNOSTIC STUDIES: Oxygen Saturation is 96% on RA, normal by my interpretation.    COORDINATION OF CARE: 1:09 AM Discussed treatment plan with pt at bedside and pt agreed to plan. Pt well appearing and improved However CXR is abnormal - I advised further workup including EKG and BNP Pt refused and requested to be discharged home   Imaging Review Dg Chest 2 View  12/06/2014   CLINICAL DATA:  Subacute onset of productive cough and shortness of breath for 3 weeks. Initial encounter.  EXAM: CHEST  2 VIEW  COMPARISON:  Chest radiograph performed 11/14/2014  FINDINGS: The lungs are well-aerated. Mild vascular congestion is noted. Mild bilateral central airspace opacity may reflect mild pulmonary edema, slightly more prominent than on the prior study. There is no evidence of pleural effusion or  pneumothorax.  The heart is borderline normal in size. No acute osseous abnormalities are seen.  IMPRESSION: Mild vascular congestion. Mild bilateral central airspace opacity may reflect mild pulmonary edema.   Electronically Signed   By: Roanna RaiderJeffery  Chang M.D.   On: 12/06/2014 00:55      MDM   Final diagnoses:  Dyspnea  Chronic obstructive pulmonary disease, unspecified COPD, unspecified chronic bronchitis type    Nursing notes including past medical history and social history reviewed and considered in documentation xrays/imaging reviewed by myself and considered during evaluation Previous records reviewed and considered   I personally performed the services described in this documentation, which was scribed in my presence. The recorded information has been reviewed and is accurate.      Joya Gaskinsonald W Jakia Kennebrew, MD 12/06/14 [phone]

## 2014-12-06 NOTE — ED Notes (Signed)
Family at bedside. 

## 2014-12-17 ENCOUNTER — Ambulatory Visit: Payer: Medicare Other

## 2014-12-17 NOTE — Patient Instructions (Signed)
Push-Up Position Press-Up   In push-up position, press shoulders up. Then sag down to start position. Repeat ___10-12_ times or for ____ minutes. Do __1__ sessions per day.                   DO OFF COUNTER FIRST . SHOULDERS DOWN  http://cc.exer.us/64   Copyright  VHI. All rights reserved.  Strengthening: Resisted Internal Rotation   Hold tubing in left hand, elbow at side and forearm out. Rotate forearm in across body. Repeat ____ times per set. Do ____ sets per session. Do ____ sessions per day.  http://orth.exer.us/830   Copyright  VHI. All rights reserved.  Strengthening: Resisted External Rotation   Hold tubing in right hand, elbow at side and forearm across body. Rotate forearm out. Repeat ____ times per set. Do ____ sets per session. Do ____ sessions per day.  http://orth.exer.us/828   Copyright  VHI. All rights reserved.  Strengthening: Resisted Flexion   Hold tubing with left arm at side. Pull forward and up. Move shoulder through pain-free range of motion. Repeat ____ times per set. Do ____ sets per session. Do ____ sessions per day.  http://orth.exer.us/824   Copyright  VHI. All rights reserved.  Strengthening: Resisted Extension      Hold tubing in right hand, arm forward. Pull arm back, elbow straight.                                  DO ALL 10-15 REPS RED BAND 1X/DAYScapular Retraction: "T" (Eccentric) - Prone (Ball)   Lie over ball or table. Quickly lift arms into "T", thumbs up. Squeeze shoulder blades. Slowly lower for 3-5 seconds. Keep head in line with spine. 10-15___ reps per set, __1_ sets per day, __5_ days per week. Add _3__ lbs when you achieve ___ repetitions.  CAN DO ONE ARM OFF BED  Copyright  VHI. All rights reserved.   Repeat ____ times per set. Do ____ sets per session. Do ____ sessions per day.  http://orth.exer.us/832   Copyright  VHI. All rights reserved.

## 2014-12-19 ENCOUNTER — Ambulatory Visit: Payer: Medicare Other

## 2014-12-23 ENCOUNTER — Other Ambulatory Visit: Payer: Self-pay | Admitting: Orthopaedic Surgery

## 2014-12-23 DIAGNOSIS — M25512 Pain in left shoulder: Secondary | ICD-10-CM

## 2014-12-24 ENCOUNTER — Ambulatory Visit: Payer: Medicare Other | Admitting: Rehabilitation

## 2014-12-26 ENCOUNTER — Ambulatory Visit: Payer: Medicare Other

## 2015-01-03 ENCOUNTER — Ambulatory Visit
Admission: RE | Admit: 2015-01-03 | Discharge: 2015-01-03 | Disposition: A | Discharge status: Home / Self Care | Payer: Medicare Other | Source: Ambulatory Visit | Attending: Orthopaedic Surgery | Admitting: Orthopaedic Surgery

## 2015-01-03 DIAGNOSIS — M25512 Pain in left shoulder: Secondary | ICD-10-CM

## 2015-01-08 NOTE — Therapy (Addendum)
[facility] [address] [place], [place], [address] Phone: [phone]   Fax:  [phone]  Physical Therapy Treatment  Patient Details  Name: [name] MRN: [id number] Date of Birth: 27-Sep-1966 Referring Provider:  Thayer HeadingsMackenzie, Brian, MD  Encounter Date: 12/17/2014    Past Medical History  Diagnosis Date  . Abscess     rectal  . Depression   . Fibromyalgia   . Ovarian cyst   . Endometriosis   . Pulmonary infiltrates   . Dyspnea   . COPD (chronic obstructive pulmonary disease)   . Emphysema lung   . Chronic bronchitis     "I get it q yr" (11/14/2014)  . Migraine     "3 in the last 2 wks" (11/14/2014)  . Arthritis     "back; hips" (11/14/2014)  . Chronic back pain     "all over"  . Bipolar disorder   . Anxiety   . ILD (interstitial lung disease) 11/2013    Past Surgical History  Procedure Laterality Date  . Appendectomy  1993  . Laparoscopic cholecystectomy  08/2007  . Examination under anesthesia  02/15/2012    Procedure: EXAM UNDER ANESTHESIA;  Surgeon: Maisie Fushomas A. Cornett, MD;  Location: [facility];  Service: General;  Laterality: Left;  Excision of perineal cyst  . Video bronchoscopy Bilateral 02/15/2014    Procedure: VIDEO BRONCHOSCOPY WITH FLUORO;  Surgeon: Coralyn HellingVineet Sood, MD;  Location: [facility];  Service: Cardiopulmonary;  Laterality: Bilateral;  . Laparoscopic lysis of adhesions  06/2010    Hattie Perch/notes 06/18/2010  . Transobturator sling  09/2008    Transobturator mid urethral sling, cystoscopy, laparoscopy w/LOA/notes 03/04/2011  . Cyst excision perineal Left 01/2012    Hattie Perch/notes 02/15/2012  . Breast biopsy Left 2008  . Breast lumpectomy Left 2008    "benign"  . Vaginal hysterectomy  1993  . Tubal ligation  1993  . Ovarian cyst removal  X 2-3  . Left oophorectomy Left 08/2013    There were no vitals taken for this visit.  Visit Diagnosis:  Left shoulder pain     Rema JasmineConnie Jasek canceled  but another patient with last name similar to Frazier RichardsShepherd was canceled . That patient answered to [facility] name and treatment  was started then stopped when she reported she was not Rema Jasmineonnie Fels and the scheduling error was identified.                       PT Short Term Goals - 12/05/14 1454    PT SHORT TERM GOAL #1   Title she will be independent wth the inital HEP   Time 3   Period Weeks   Status New   PT SHORT TERM GOAL #2   Title she will report 305 or more decrease in pain.   Time 3   Period Weeks   Status New           PT Long Term Goals - 12/05/14 1456    PT LONG TERM GOAL #1   Title She will be independent with HEP issued as of last visit   Period Weeks   Status New   PT LONG TERM GOAL #2   Title pain inproved 50% or mroe with home tasks   Time 6   Period Weeks   Status New   PT LONG TERM GOAL #3   Title She will be able to carry groceries in LT arm from car to home with 2-3  max pain   Time 6   Period Weeks   Status New               Problem List Patient Active Problem List   Diagnosis Date Noted  . Nausea with vomiting   . SOB (shortness of breath)   . COPD with acute exacerbation 11/14/2014  . Hypokalemia 11/14/2014  . Acute respiratory failure 11/14/2014  . COPD exacerbation 11/14/2014  . Abnormal EKG 11/14/2014  . COPD with emphysema 06/17/2014  . Leg swelling 06/17/2014  . Fatigue 06/17/2014  . Tobacco abuse 01/20/2014  . Pulmonary infiltrate 01/11/2014  . MIXED INCONTINENCE URGE AND STRESS 09/19/2007  . Bipolar disorder 06/26/2007  . FIBROMYALGIA 06/26/2007  . INSOMNIA 06/26/2007    Caprice Red PT 01/08/2015, 8:58 AM  [facility] [address] [place], [place], [address] Phone: [phone]   Fax:  [phone]

## 2015-01-08 NOTE — Therapy (Signed)
[facility] [address] [place], [place], [address] Phone: [phone]   Fax:  [phone]  Physical Therapy Treatment  Patient Details  Name: [name] MRN: [id number] Date of Birth: 06-05-1966 Referring Provider:  Thayer HeadingsMackenzie, Brian, MD  Encounter Date: 12/17/2014    Past Medical History  Diagnosis Date  . Abscess     rectal  . Depression   . Fibromyalgia   . Ovarian cyst   . Endometriosis   . Pulmonary infiltrates   . Dyspnea   . COPD (chronic obstructive pulmonary disease)   . Emphysema lung   . Chronic bronchitis     "I get it q yr" (11/14/2014)  . Migraine     "3 in the last 2 wks" (11/14/2014)  . Arthritis     "back; hips" (11/14/2014)  . Chronic back pain     "all over"  . Bipolar disorder   . Anxiety   . ILD (interstitial lung disease) 11/2013    Past Surgical History  Procedure Laterality Date  . Appendectomy  1993  . Laparoscopic cholecystectomy  08/2007  . Examination under anesthesia  02/15/2012    Procedure: EXAM UNDER ANESTHESIA;  Surgeon: Maisie Fushomas A. Cornett, MD;  Location: [facility];  Service: General;  Laterality: Left;  Excision of perineal cyst  . Video bronchoscopy Bilateral 02/15/2014    Procedure: VIDEO BRONCHOSCOPY WITH FLUORO;  Surgeon: Coralyn HellingVineet Sood, MD;  Location: [facility];  Service: Cardiopulmonary;  Laterality: Bilateral;  . Laparoscopic lysis of adhesions  06/2010    Hattie Perch/notes 06/18/2010  . Transobturator sling  09/2008    Transobturator mid urethral sling, cystoscopy, laparoscopy w/LOA/notes 03/04/2011  . Cyst excision perineal Left 01/2012    Hattie Perch/notes 02/15/2012  . Breast biopsy Left 2008  . Breast lumpectomy Left 2008    "benign"  . Vaginal hysterectomy  1993  . Tubal ligation  1993  . Ovarian cyst removal  X 2-3  . Left oophorectomy Left 08/2013    There were no vitals taken for this visit.  Visit Diagnosis:  Left shoulder pain   This patient called to cancel  but another patient was canceled and she cam back when called from lobby Treatment was initiated until she revealed she was not Julie Jasmineonnie Price.  The treatment was stopped and no charges filed due to scheduling error of canceling wrong patient                         PT Short Term Goals - 12/05/14 1454    PT SHORT TERM GOAL #1   Title she will be independent wth the inital HEP   Time 3   Period Weeks   Status New   PT SHORT TERM GOAL #2   Title she will report 305 or more decrease in pain.   Time 3   Period Weeks   Status New           PT Long Term Goals - 12/05/14 1456    PT LONG TERM GOAL #1   Title She will be independent with HEP issued as of last visit   Period Weeks   Status New   PT LONG TERM GOAL #2   Title pain inproved 50% or mroe with home tasks   Time 6   Period Weeks   Status New   PT LONG TERM GOAL #3   Title She will be able to carry groceries in LT arm from car to  home with 2-3 max pain   Time 6   Period Weeks   Status New               Problem List Patient Active Problem List   Diagnosis Date Noted  . Nausea with vomiting   . SOB (shortness of breath)   . COPD with acute exacerbation 11/14/2014  . Hypokalemia 11/14/2014  . Acute respiratory failure 11/14/2014  . COPD exacerbation 11/14/2014  . Abnormal EKG 11/14/2014  . COPD with emphysema 06/17/2014  . Leg swelling 06/17/2014  . Fatigue 06/17/2014  . Tobacco abuse 01/20/2014  . Pulmonary infiltrate 01/11/2014  . MIXED INCONTINENCE URGE AND STRESS 09/19/2007  . Bipolar disorder 06/26/2007  . FIBROMYALGIA 06/26/2007  . INSOMNIA 06/26/2007    Caprice Red PT 01/08/2015, 8:54 AM  [facility] [address] [place], [place], [address] Phone: [phone]   Fax:  [phone]

## 2015-03-28 ENCOUNTER — Other Ambulatory Visit: Payer: Self-pay | Admitting: Pulmonary Disease

## 2015-06-17 ENCOUNTER — Other Ambulatory Visit: Payer: Self-pay | Admitting: Orthopaedic Surgery

## 2015-06-17 DIAGNOSIS — M545 Low back pain: Secondary | ICD-10-CM

## 2015-06-22 ENCOUNTER — Ambulatory Visit
Admission: RE | Admit: 2015-06-22 | Discharge: 2015-06-22 | Disposition: A | Discharge status: Home / Self Care | Payer: Medicare Other | Source: Ambulatory Visit | Attending: Orthopaedic Surgery | Admitting: Orthopaedic Surgery

## 2015-06-22 ENCOUNTER — Other Ambulatory Visit: Payer: Medicare Other

## 2015-06-22 DIAGNOSIS — M545 Low back pain: Secondary | ICD-10-CM

## 2015-06-24 ENCOUNTER — Other Ambulatory Visit: Payer: Medicare Other

## 2015-07-02 ENCOUNTER — Encounter: Payer: Self-pay | Admitting: Physical Medicine & Rehabilitation

## 2015-07-28 ENCOUNTER — Ambulatory Visit (HOSPITAL_BASED_OUTPATIENT_CLINIC_OR_DEPARTMENT_OTHER): Payer: Medicare Other | Admitting: Physical Medicine & Rehabilitation

## 2015-07-28 ENCOUNTER — Encounter: Payer: Self-pay | Admitting: Physical Medicine & Rehabilitation

## 2015-07-28 ENCOUNTER — Encounter: Payer: Medicare Other | Attending: Physical Medicine & Rehabilitation

## 2015-07-28 VITALS — BP 101/62 | HR 63

## 2015-07-28 DIAGNOSIS — Z5181 Encounter for therapeutic drug level monitoring: Secondary | ICD-10-CM | POA: Diagnosis not present

## 2015-07-28 DIAGNOSIS — Z79899 Other long term (current) drug therapy: Secondary | ICD-10-CM | POA: Diagnosis not present

## 2015-07-28 DIAGNOSIS — F17210 Nicotine dependence, cigarettes, uncomplicated: Secondary | ICD-10-CM | POA: Diagnosis not present

## 2015-07-28 DIAGNOSIS — J449 Chronic obstructive pulmonary disease, unspecified: Secondary | ICD-10-CM | POA: Diagnosis not present

## 2015-07-28 DIAGNOSIS — G894 Chronic pain syndrome: Secondary | ICD-10-CM | POA: Diagnosis present

## 2015-07-28 DIAGNOSIS — M25512 Pain in left shoulder: Secondary | ICD-10-CM | POA: Diagnosis not present

## 2015-07-28 DIAGNOSIS — M797 Fibromyalgia: Secondary | ICD-10-CM | POA: Insufficient documentation

## 2015-07-28 DIAGNOSIS — M542 Cervicalgia: Secondary | ICD-10-CM | POA: Insufficient documentation

## 2015-07-28 NOTE — Patient Instructions (Signed)
Please see with Dr. Otelia Sergeant Recommends in terms of neck surgery. If he recommends neck surgery then we will see you back only if you continue to have significant pain postoperatively. He'll need to give it a couple months postoperatively to assess this. If Dr. Otelia Sergeant Does not recommend surgery then we will see you back in about 2 weeks

## 2015-07-28 NOTE — Progress Notes (Addendum)
Subjective:    Patient ID: [name], female    DOB: Sep 11, 1966, 49 y.o.   MRN: [id number]  HPI  CC Left sided neck and shoulder pain 49 year old female with past medical history significant for fibromyalgia and bipolar disorder who has a 70yr history  of neck pain and left shoulder pain. Patient states that she was initially injured on the job at [employer] but never filed for [employer].  Left side greater than right side of the neck pain. Posterior neck mainly involved pain travels down to the shoulder. She's had several MRIs of the cervical spine as well as a left shoulder MRI. Results are as listed below. She does have left C5-C6 disc degeneration as well as foraminal stenosis. In addition she has tendinopathy noted on shoulder MRI left side. Underwent subacromial decompression and debridement and arthroscopy by orthopedics earlier this year. Has an appointment to see an orthopedic spine surgeon today. Has not seen a neurosurgeon or orthopedic spine surgeon in regards to her neck in the past.  Patient complains of weakness in the upper arm. No history of EMG in the left upper.   Patient has had 6 injections of her neck with fluoroscopy, Dr. Ethelene Hal perform the injections at [facility] with some sedation.It sounded like she had facet injections although I do not have any records from this. Unsure whether she has had any cervical epidurals. Patient states  the injections were helpful for about a month  Dr. Ronne Binning at [facility] is primary care physician Dr. Roda Shutters is her orthopedist and has been prescribing about to oxycodone 5 mg tablets per day. Psychiatrist is Dr. Donell Beers  Last dose of oxycodone was more than 1 week ago.Did not have any withdrawal she is worried that she may have a withdrawal syndrome however reassured her that the time his past already.. Also she is only on twice a day dosing so that there should not be any withdrawal. Pain  Inventory Average Pain 8 Pain Right Now 8 My pain is sharp, dull and aching  In the last 24 hours, has pain interfered with the following? General activity 8 Relation with others 0 Enjoyment of life 9 What TIME of day is your pain at its worst? all Sleep (in general) Poor  Pain is worse with: sitting, inactivity and some activites Pain improves with: heat/ice and medication Relief from Meds: 9  Mobility walk without assistance ability to climb steps?  yes do you drive?  yes  Function I need assistance with the following:  household duties and shopping  Neuro/Psych depression anxiety  Prior Studies Any changes since last visit?  no  Physicians involved in your care Any changes since last visit?  no   Family History  Problem Relation Age of Onset  . Price Father     lung  . COPD Father   . Early death Father   . Other Neg Hx   . Hypertension Sister   . Price Maternal Aunt     LUNG Price   Social History   Social History  . Marital Status: Married    Spouse Name: N/A  . Number of Children: N/A  . Years of Education: N/A   Social History Main Topics  . Smoking status: Current Every Day Smoker -- 1.00 packs/day for 32 years    Types: Cigarettes  . Smokeless tobacco: Never Used     Comment: currently 1/2 ppd  . Alcohol Use: No  . Drug Use: No  .  Sexual Activity: Not Currently    Birth Control/ Protection: Surgical   Other Topics Concern  . None   Social History Narrative   Past Surgical History  Procedure Laterality Date  . Appendectomy  1993  . Laparoscopic cholecystectomy  08/2007  . Examination under anesthesia  02/15/2012    Procedure: EXAM UNDER ANESTHESIA;  Surgeon: Maisie Fus A. Cornett, MD;  Location: [facility];  Service: General;  Laterality: Left;  Excision of perineal cyst  . Video bronchoscopy Bilateral 02/15/2014    Procedure: VIDEO BRONCHOSCOPY WITH FLUORO;  Surgeon: Coralyn Helling, MD;  Location: [facility];  Service:  Cardiopulmonary;  Laterality: Bilateral;  . Laparoscopic lysis of adhesions  06/2010    Hattie Perch 06/18/2010  . Transobturator sling  09/2008    Transobturator mid urethral sling, cystoscopy, laparoscopy w/LOA/notes 03/04/2011  . Cyst excision perineal Left 01/2012    Hattie Perch 02/15/2012  . Breast biopsy Left 2008  . Breast lumpectomy Left 2008    "benign"  . Vaginal hysterectomy  1993  . Tubal ligation  1993  . Ovarian cyst removal  X 2-3  . Left oophorectomy Left 08/2013   Past Medical History  Diagnosis Date  . Abscess     rectal  . Depression   . Fibromyalgia   . Ovarian cyst   . Endometriosis   . Pulmonary infiltrates   . Dyspnea   . COPD (chronic obstructive pulmonary disease)   . Emphysema lung   . Chronic bronchitis     "I get it q yr" (11/14/2014)  . Migraine     "3 in the last 2 wks" (11/14/2014)  . Arthritis     "back; hips" (11/14/2014)  . Chronic back pain     "all over"  . Bipolar disorder   . Anxiety   . ILD (interstitial lung disease) 11/2013   BP 101/62 mmHg  Pulse 63  SpO2 97%  Opioid Risk Score:   Fall Risk Score:  `1  Depression screen PHQ 2/9  Depression screen PHQ 2/9 07/28/2015  Decreased Interest 1  Down, Depressed, Hopeless 1  PHQ - 2 Score 2  Altered sleeping 3  Tired, decreased energy 2  Change in appetite 1  Feeling bad or failure about yourself  0  Trouble concentrating 1  Moving slowly or fidgety/restless 0  Suicidal thoughts 0  PHQ-9 Score 9  Difficult doing work/chores Somewhat difficult     Review of Systems  Musculoskeletal: Positive for neck pain.  Hematological: Bruises/bleeds easily.  Psychiatric/Behavioral: Positive for dysphoric mood. The patient is nervous/anxious.   All other systems reviewed and are negative.      Objective:   Physical Exam  Cardiovascular: Normal rate, regular rhythm and normal heart sounds.   Pulmonary/Chest: Effort normal and breath sounds normal.  Abdominal: Soft. Bowel sounds are normal.    Nursing note and vitals reviewed.   Neuro:  Eyes without evidence of nystagmus  Tone is normal without evidence of spasticity Cerebellar exam shows no evidence of ataxia on finger nose finger or heel to shin testing No evidence of trunkal ataxia  Motor strength is 5/5 in bilateral deltoid, biceps, triceps, finger flexors and extensors, wrist flexors and extensors, hip flexors, knee flexors and extensors, ankle dorsiflexors, plantar flexors, invertors and evertors, toe flexors and extensors  Sensory exam is normal to pinprick, proprioception and light touch in the upper and lower limbs   Cranial nerves II- Visual fields are intact to confrontation testing, no blurring of vision III- no evidence of  ptosis, upward, downward and medial gaze intact IV- no vertical diplopia or head tilt V- no facial numbness or masseter weakness VI- no pupil abduction weakness VII- no facial droop, good lid closure VII- normal auditory acuity IX- no pharygeal weakness, gag nl X- no pharyngeal weakness, no hoarseness XI- no trap or SCM weakness XII- no glossal weakness        Assessment & Plan:  1. Chronic neck pain and shoulder pain. No relief after left shoulder surgery. Is undergoing  Orthopedic spine surgery evaluation. Patient may benefit from C5-C6 decompression. I instructed the patient to first decide whether or not she will have surgery in conjunction with her orthopedic spine specialist. If she undergoes surgery then she should return this clinic after adequate postoperative recovery i.e. At least 2 months. She should only return if she has continued pain postoperatively.  If she decides not to undergo surgery she will come back here in a couple weeks will review urine drug screen, agree with low-dose oxycodone 5 mg twice a day.  Opioid risk is moderate, 5, will need monthly monitoring.

## 2015-08-12 ENCOUNTER — Encounter: Payer: Self-pay | Admitting: Registered Nurse

## 2015-08-12 ENCOUNTER — Encounter: Payer: Medicare Other | Attending: Registered Nurse | Admitting: Registered Nurse

## 2015-08-12 VITALS — BP 121/68 | HR 68 | Resp 14

## 2015-08-12 DIAGNOSIS — Z5181 Encounter for therapeutic drug level monitoring: Secondary | ICD-10-CM | POA: Diagnosis not present

## 2015-08-12 DIAGNOSIS — F17210 Nicotine dependence, cigarettes, uncomplicated: Secondary | ICD-10-CM | POA: Diagnosis not present

## 2015-08-12 DIAGNOSIS — Z79899 Other long term (current) drug therapy: Secondary | ICD-10-CM | POA: Insufficient documentation

## 2015-08-12 DIAGNOSIS — J449 Chronic obstructive pulmonary disease, unspecified: Secondary | ICD-10-CM | POA: Insufficient documentation

## 2015-08-12 DIAGNOSIS — M797 Fibromyalgia: Secondary | ICD-10-CM | POA: Diagnosis not present

## 2015-08-12 DIAGNOSIS — M25512 Pain in left shoulder: Secondary | ICD-10-CM | POA: Insufficient documentation

## 2015-08-12 DIAGNOSIS — M542 Cervicalgia: Secondary | ICD-10-CM | POA: Insufficient documentation

## 2015-08-12 DIAGNOSIS — G894 Chronic pain syndrome: Secondary | ICD-10-CM | POA: Insufficient documentation

## 2015-08-12 MED ORDER — OXYCODONE HCL 5 MG PO TABS: 5.0000 mg | ORAL_TABLET | Freq: Two times a day (BID) | ORAL | Status: DC | PRN

## 2015-08-12 NOTE — Progress Notes (Signed)
Subjective:    Patient ID: [name], feBaird Cancer9/25/1967, 50 y.o.   MRN: [id number]  HPI: [name] is a 49 year old female who returns for follow up for chronic pain and medication refill. She says her pain is located in her neck, bilateral shoulders and lower back. Also states she has " Fibro Pain". She rates her pain 7. Her current exercise regime is walking. [name] has decided not to have surgery and will follow up with pain management for pain control. UDS was consistent narcotic contract reviewed and signed. She will be prescribed oxycodone 5 mg twice a day, she verbalizes understanding.  Pain Inventory Average Pain 7 Pain Right Now 7 My pain is dull and aching  In the last 24 hours, has pain interfered with the following? General activity 7 Relation with others 4 Enjoyment of life 7 What TIME of day is your pain at its worst? all Sleep (in general) Poor  Pain is worse with: walking, sitting, inactivity and some activites Pain improves with: rest, heat/ice and medication Relief from Meds: 0  Mobility walk without assistance do you drive?  yes  Function disabled: date disabled . I need assistance with the following:  household duties and shopping  Neuro/Psych weakness numbness  Prior Studies Any changes since last visit?  no  Physicians involved in your care Any changes since last visit?  no   Family History  Problem Relation Age of Onset  . Cancer Father     lung  . COPD Father   . Early death Father   . Other Neg Hx   . Hypertension Sister   . Cancer Maternal Aunt     LUNG CANCER   Social History   Social History  . Marital Status: Married    Spouse Name: N/A  . Number of Children: N/A  . Years of Education: N/A   Social History Main Topics  . Smoking status: Current Every Day Smoker -- 1.00 packs/day for 32 years    Types: Cigarettes  . Smokeless tobacco: Never Used     Comment: currently 1/2 ppd  . Alcohol  Use: No  . Drug Use: No  . Sexual Activity: Not Currently    Birth Control/ Protection: Surgical   Other Topics Concern  . None   Social History Narrative   Past Surgical History  Procedure Laterality Date  . Appendectomy  1993  . Laparoscopic cholecystectomy  08/2007  . Examination under anesthesia  02/15/2012    Procedure: EXAM UNDER ANESTHESIA;  Surgeon: Maisie Fus A. Cornett, MD;  Location: [facility];  Service: General;  Laterality: Left;  Excision of perineal cyst  . Video bronchoscopy Bilateral 02/15/2014    Procedure: VIDEO BRONCHOSCOPY WITH FLUORO;  Surgeon: Coralyn Helling, MD;  Location: [facility];  Service: Cardiopulmonary;  Laterality: Bilateral;  . Laparoscopic lysis of adhesions  06/2010    Hattie Perch 06/18/2010  . Transobturator sling  09/2008    Transobturator mid urethral sling, cystoscopy, laparoscopy w/LOA/notes 03/04/2011  . Cyst excision perineal Left 01/2012    Hattie Perch 02/15/2012  . Breast biopsy Left 2008  . Breast lumpectomy Left 2008    "benign"  . Vaginal hysterectomy  1993  . Tubal ligation  1993  . Ovarian cyst removal  X 2-3  . Left oophorectomy Left 08/2013   Past Medical History  Diagnosis Date  . Abscess     rectal  . Depression   . Fibromyalgia   .  Ovarian cyst   . Endometriosis   . Pulmonary infiltrates   . Dyspnea   . COPD (chronic obstructive pulmonary disease) (HCC)   . Emphysema lung (HCC)   . Chronic bronchitis (HCC)     "I get it q yr" (11/14/2014)  . Migraine     "3 in the last 2 wks" (11/14/2014)  . Arthritis     "back; hips" (11/14/2014)  . Chronic back pain     "all over"  . Bipolar disorder (HCC)   . Anxiety   . ILD (interstitial lung disease) (HCC) 11/2013   BP 121/68 mmHg  Pulse 68  Resp 14  SpO2 93%  Opioid Risk Score:   Fall Risk Score:  `1  Depression screen PHQ 2/9  Depression screen PHQ 2/9 07/28/2015  Decreased Interest 1  Down, Depressed, Hopeless 1  PHQ - 2 Score 2  Altered sleeping 3  Tired,  decreased energy 2  Change in appetite 1  Feeling bad or failure about yourself  0  Trouble concentrating 1  Moving slowly or fidgety/restless 0  Suicidal thoughts 0  PHQ-9 Score 9  Difficult doing work/chores Somewhat difficult     Review of Systems  Neurological: Positive for weakness and numbness.  Hematological: Bruises/bleeds easily.  All other systems reviewed and are negative.      Objective:   Physical Exam  Constitutional: She is oriented to person, place, and time. She appears well-developed and well-nourished.  HENT:  Head: Normocephalic and atraumatic.  Neck: Normal range of motion. Neck supple.  Cervical Paraspinal Tenderness: C-5- C-6  Cardiovascular: Normal rate and regular rhythm.   Pulmonary/Chest: Effort normal and breath sounds normal.  Musculoskeletal:  Normal Muscle Bulk and Muscle Testing Reveals: Upper Extremities: Full ROM and Muscle Strength 5/5 Thoracic Paraspinal Tenderness: T-1- T-3 Lumbar Paraspinal Tenderness: L-4- L-5 Lower Extremities: Full ROM and Muscle Strength 5/5 Arises from chair with ease Narrow Based Gait  Neurological: She is alert and oriented to person, place, and time.  Skin: Skin is warm and dry.  Psychiatric: She has a normal mood and affect.  Nursing note and vitals reviewed.         Assessment & Plan:  1. Chronic neck and shoulder pain.She has refused surgery at this time.RX: Oxycodone  twice a day #60.  20 minutes of face to face patient care time was spent during this visit. All questions were encouraged and answered.  F/U in 1 month

## 2015-08-12 NOTE — Progress Notes (Signed)
Urine drug screen for this encounter is consistent for prescribed medication 

## 2015-08-25 ENCOUNTER — Telehealth: Payer: Self-pay | Admitting: Pulmonary Disease

## 2015-08-25 NOTE — Telephone Encounter (Signed)
Spoke with VS, he has form but needs OV and ok to schedule with another provider. Called pt and appt scheduled with MW on Monday 10/31. Nothing further needed

## 2015-09-01 ENCOUNTER — Ambulatory Visit: Payer: Medicare Other | Admitting: Internal Medicine

## 2015-09-03 ENCOUNTER — Encounter: Payer: Medicare Other | Admitting: Registered Nurse

## 2015-09-05 ENCOUNTER — Ambulatory Visit: Payer: Medicare Other | Admitting: Registered Nurse

## 2015-09-10 ENCOUNTER — Encounter: Payer: Medicare Other | Attending: Registered Nurse | Admitting: Registered Nurse

## 2015-09-10 ENCOUNTER — Encounter: Payer: Self-pay | Admitting: Registered Nurse

## 2015-09-10 VITALS — BP 148/86 | HR 96

## 2015-09-10 DIAGNOSIS — M25512 Pain in left shoulder: Secondary | ICD-10-CM | POA: Diagnosis not present

## 2015-09-10 DIAGNOSIS — Z79899 Other long term (current) drug therapy: Secondary | ICD-10-CM

## 2015-09-10 DIAGNOSIS — G894 Chronic pain syndrome: Secondary | ICD-10-CM | POA: Diagnosis not present

## 2015-09-10 DIAGNOSIS — M797 Fibromyalgia: Secondary | ICD-10-CM | POA: Diagnosis not present

## 2015-09-10 DIAGNOSIS — J449 Chronic obstructive pulmonary disease, unspecified: Secondary | ICD-10-CM | POA: Insufficient documentation

## 2015-09-10 DIAGNOSIS — Z5181 Encounter for therapeutic drug level monitoring: Secondary | ICD-10-CM

## 2015-09-10 DIAGNOSIS — M542 Cervicalgia: Secondary | ICD-10-CM | POA: Insufficient documentation

## 2015-09-10 DIAGNOSIS — F17210 Nicotine dependence, cigarettes, uncomplicated: Secondary | ICD-10-CM | POA: Insufficient documentation

## 2015-09-10 DIAGNOSIS — M5030 Other cervical disc degeneration, unspecified cervical region: Secondary | ICD-10-CM | POA: Diagnosis not present

## 2015-09-10 MED ORDER — OXYCODONE HCL 5 MG PO TABS: 5.0000 mg | ORAL_TABLET | Freq: Two times a day (BID) | ORAL | Status: DC | PRN

## 2015-09-10 NOTE — Progress Notes (Signed)
Subjective:    Patient ID: [name], female    DOB: [birth year], 49 y.o.   MRN: [id number]  HPI: [name] is a 49 year old female who returns for follow up for chronic pain and medication refill. She says her pain is located in her neck radiating into her bilateral shoulders and upper extremities and lower back. Also states she has " Fibro Pain". She rates her pain 8. Also states her pain has intensified in her neck and lower back with weather changing. She is awaiting medical clearance she states  and Dr. Lincoln Brigham will perform cervical surgery. Will increase her oxycodone tablets today she verbalizes understanding.Her current exercise regime is walking.  Pain Inventory Average Pain 7 Pain Right Now 8 My pain is dull and aching  In the last 24 hours, has pain interfered with the following? General activity 7 Relation with others 4 Enjoyment of life 4 What TIME of day is your pain at its worst? all the time Sleep (in general) Poor  Pain is worse with: sitting and some activites Pain improves with: rest, heat/ice and medication Relief from Meds: 4  Mobility ability to climb steps?  yes do you drive?  yes  Function disabled: date disabled NA I need assistance with the following:  household duties and shopping Do you have any goals in this area?  yes  Neuro/Psych weakness  Prior Studies Any changes since last visit?  no  Physicians involved in your care Any changes since last visit?  no   Family History  Problem Relation Age of Onset  . Price Father     lung  . COPD Father   . Early death Father   . Other Neg Hx   . Hypertension Sister   . Price Maternal Aunt     LUNG Price   Social History   Social History  . Marital Status: Married    Spouse Name: N/A  . Number of Children: N/A  . Years of Education: N/A   Social History Main Topics  . Smoking status: Current Every Day Smoker -- 1.00 packs/day for 32 years    Types: Cigarettes  .  Smokeless tobacco: Never Used     Comment: currently 1/2 ppd  . Alcohol Use: No  . Drug Use: No  . Sexual Activity: Not Currently    Birth Control/ Protection: Surgical   Other Topics Concern  . None   Social History Narrative   Past Surgical History  Procedure Laterality Date  . Appendectomy  1993  . Laparoscopic cholecystectomy  08/2007  . Examination under anesthesia  02/15/2012    Procedure: EXAM UNDER ANESTHESIA;  Surgeon: Maisie Fus A. Cornett, MD;  Location: [facility];  Service: General;  Laterality: Left;  Excision of perineal cyst  . Video bronchoscopy Bilateral 02/15/2014    Procedure: VIDEO BRONCHOSCOPY WITH FLUORO;  Surgeon: Coralyn Helling, MD;  Location: [facility];  Service: Cardiopulmonary;  Laterality: Bilateral;  . Laparoscopic lysis of adhesions  06/2010    Hattie Perch 06/18/2010  . Transobturator sling  09/2008    Transobturator mid urethral sling, cystoscopy, laparoscopy w/LOA/notes 03/04/2011  . Cyst excision perineal Left 01/2012    Hattie Perch 02/15/2012  . Breast biopsy Left 2008  . Breast lumpectomy Left 2008    "benign"  . Vaginal hysterectomy  1993  . Tubal ligation  1993  . Ovarian cyst removal  X 2-3  . Left oophorectomy Left 08/2013   Past Medical History  Diagnosis Date  .  Abscess     rectal  . Depression   . Fibromyalgia   . Ovarian cyst   . Endometriosis   . Pulmonary infiltrates   . Dyspnea   . COPD (chronic obstructive pulmonary disease) (HCC)   . Emphysema lung (HCC)   . Chronic bronchitis (HCC)     "I get it q yr" (11/14/2014)  . Migraine     "3 in the last 2 wks" (11/14/2014)  . Arthritis     "back; hips" (11/14/2014)  . Chronic back pain     "all over"  . Bipolar disorder (HCC)   . Anxiety   . ILD (interstitial lung disease) (HCC) 11/2013   BP 148/86 mmHg  Pulse 96  SpO2 95%  Opioid Risk Score:   Fall Risk Score:  `1  Depression screen PHQ 2/9  Depression screen PHQ 2/9 07/28/2015  Decreased Interest 1  Down, Depressed,  Hopeless 1  PHQ - 2 Score 2  Altered sleeping 3  Tired, decreased energy 2  Change in appetite 1  Feeling bad or failure about yourself  0  Trouble concentrating 1  Moving slowly or fidgety/restless 0  Suicidal thoughts 0  PHQ-9 Score 9  Difficult doing work/chores Somewhat difficult     Review of Systems  Neurological: Positive for weakness.  All other systems reviewed and are negative.      Objective:   Physical Exam  Constitutional: She is oriented to person, place, and time. She appears well-developed and well-nourished.  HENT:  Head: Normocephalic.  Neck: Normal range of motion. Neck supple.  Cervical Paraspinal Tenderness: C-3- C-6  Cardiovascular: Normal rate and regular rhythm.   Pulmonary/Chest: Effort normal and breath sounds normal.  Musculoskeletal:  Normal Muscle Bulk and Muscle Testing Reveals: Upper Extremities: Full ROM and Muscle Strength 5/5 Thoracic Paraspinal Tenderness: T-1- T-3 Lumbar Paraspinal Tenderness: L-4- L-5 Lower Extremities: Full ROM and Muscle Strength 5/5 Arises from chair with ease Narrow Based Gait  Neurological: She is alert and oriented to person, place, and time.  Skin: Skin is warm and dry.  Psychiatric: She has a normal mood and affect.  Nursing note and vitals reviewed.         Assessment & Plan:  1. Chronic neck and shoulder pain.She has refused surgery at this time.RX: Oxycodone 5mg  twice a day, may take an extra tablet when pain is sever. Increased to #70.  20 minutes of face to face patient care time was spent during this visit. All questions were encouraged and answered.  F/U in 1 month

## 2015-09-16 ENCOUNTER — Ambulatory Visit (INDEPENDENT_AMBULATORY_CARE_PROVIDER_SITE_OTHER): Payer: Medicare Other | Admitting: Internal Medicine

## 2015-09-16 ENCOUNTER — Encounter: Payer: Self-pay | Admitting: Internal Medicine

## 2015-09-16 VITALS — BP 106/70 | HR 65 | Ht 63.0 in | Wt 186.4 lb

## 2015-09-16 DIAGNOSIS — Z720 Tobacco use: Secondary | ICD-10-CM

## 2015-09-16 DIAGNOSIS — R0602 Shortness of breath: Secondary | ICD-10-CM

## 2015-09-16 DIAGNOSIS — F17210 Nicotine dependence, cigarettes, uncomplicated: Secondary | ICD-10-CM

## 2015-09-16 DIAGNOSIS — E669 Obesity, unspecified: Secondary | ICD-10-CM

## 2015-09-16 DIAGNOSIS — J432 Centrilobular emphysema: Secondary | ICD-10-CM
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
## 2015-09-16 NOTE — Patient Instructions (Signed)
Ideally you need to quit smoking at least 2 weeks before elective surgery to reduce post op complications related to your chronic excess mucus/ coughing related to chronic bronchitis from smoking but your risk is not prohibitive so I will clear you for surgery at your request.   You are cleared for surgery but try to get out of bed asap post op with minimal narcotics (enough to control the pain and make it tolerable and no more than that)  Pulmonary follow up is as needed

## 2015-09-19 ENCOUNTER — Other Ambulatory Visit (HOSPITAL_COMMUNITY): Payer: Self-pay | Admitting: Specialist

## 2015-09-21 ENCOUNTER — Encounter: Payer: Self-pay | Admitting: Internal Medicine

## 2015-09-21 DIAGNOSIS — E669 Obesity, unspecified: Secondary | ICD-10-CM | POA: Insufficient documentation

## 2015-09-21 NOTE — Assessment & Plan Note (Addendum)
Spirometry 09/16/2015 with small airways changes only   Slight active smoking history she is actually does not meet the criteria for COPD but has Gold 0 basis of CT scanning showing emphysematous change and clinical evidence of chronic bronchitis which would of course clear up quite rapidly if she were able to stop smoking for at least 2 weeks preop, which is my recommendation for elective surgery.  Discussed in detail all the  indications, usual  risks and alternatives  relative to the benefits with patient who agrees to proceed with neck surgery as rec   Each maintenance medication was reviewed in detail including most importantly the difference between maintenance and as needed and under what circumstances the prns are to be used.  Please see instructions for details which were reviewed in writing and the patient given a copy.    Advised saba is prn, not maint rx   Total time devoted to counseling  = 35/2064m review case with pt/ discussion of options/alternatives/ giving and going over instructions (see avs)

## 2015-09-21 NOTE — Assessment & Plan Note (Addendum)
Most likely this is due to, not COPD, based on today's evaluation - has history suggesting DIP which is most likely also related to smoking. She tells me she has a chest x-ray recently done but not available today. Even if still present does not pose a significant  periop risk.

## 2015-09-21 NOTE — Assessment & Plan Note (Signed)
>   3 min discussion I reviewed the Fletcher curve with the patient that basically indicates  if you quit smoking when your best day FEV1 is still well preserved (as is clearly  the case here)  it is highly unlikely you will progress to severe disease and informed the patient there was no medication on the market that has proven to alter the curve/ its downward trajectory  or the likelihood of progression of their disease.  Therefore stopping smoking and maintaining abstinence is the most important aspect of care, not choice of inhalers or for that matter, doctors.    Critical that he stop smoking for 2 weeks preoperatively clear the chronic bronchitic changes that are most likely present causing her morning cough

## 2015-09-30 ENCOUNTER — Encounter (HOSPITAL_COMMUNITY)
Admission: RE | Admit: 2015-09-30 | Discharge: 2015-09-30 | Disposition: A | Discharge status: Home / Self Care | Payer: Medicare Other | Source: Ambulatory Visit | Attending: Specialist | Admitting: Specialist

## 2015-09-30 ENCOUNTER — Encounter (HOSPITAL_COMMUNITY): Payer: Self-pay

## 2015-09-30 DIAGNOSIS — M797 Fibromyalgia: Secondary | ICD-10-CM | POA: Diagnosis not present

## 2015-09-30 DIAGNOSIS — Z87442 Personal history of urinary calculi: Secondary | ICD-10-CM | POA: Diagnosis not present

## 2015-09-30 DIAGNOSIS — M4722 Other spondylosis with radiculopathy, cervical region: Secondary | ICD-10-CM | POA: Diagnosis present

## 2015-09-30 DIAGNOSIS — G8929 Other chronic pain: Secondary | ICD-10-CM | POA: Diagnosis not present

## 2015-09-30 DIAGNOSIS — Z885 Allergy status to narcotic agent status: Secondary | ICD-10-CM | POA: Diagnosis not present

## 2015-09-30 DIAGNOSIS — R918 Other nonspecific abnormal finding of lung field: Secondary | ICD-10-CM | POA: Diagnosis not present

## 2015-09-30 DIAGNOSIS — G43909 Migraine, unspecified, not intractable, without status migrainosus: Secondary | ICD-10-CM | POA: Diagnosis not present

## 2015-09-30 DIAGNOSIS — F319 Bipolar disorder, unspecified: Secondary | ICD-10-CM | POA: Diagnosis not present

## 2015-09-30 DIAGNOSIS — M13852 Other specified arthritis, left hip: Secondary | ICD-10-CM | POA: Diagnosis not present

## 2015-09-30 DIAGNOSIS — M7502 Adhesive capsulitis of left shoulder: Secondary | ICD-10-CM | POA: Diagnosis not present

## 2015-09-30 DIAGNOSIS — J849 Interstitial pulmonary disease, unspecified: Secondary | ICD-10-CM | POA: Diagnosis not present

## 2015-09-30 DIAGNOSIS — M13851 Other specified arthritis, right hip: Secondary | ICD-10-CM | POA: Diagnosis not present

## 2015-09-30 DIAGNOSIS — F17210 Nicotine dependence, cigarettes, uncomplicated: Secondary | ICD-10-CM | POA: Diagnosis not present

## 2015-09-30 DIAGNOSIS — J449 Chronic obstructive pulmonary disease, unspecified: Secondary | ICD-10-CM | POA: Diagnosis not present

## 2015-09-30 DIAGNOSIS — Z888 Allergy status to other drugs, medicaments and biological substances status: Secondary | ICD-10-CM | POA: Diagnosis not present

## 2015-09-30 DIAGNOSIS — F419 Anxiety disorder, unspecified: Secondary | ICD-10-CM | POA: Diagnosis not present

## 2015-09-30 DIAGNOSIS — M549 Dorsalgia, unspecified: Secondary | ICD-10-CM | POA: Diagnosis not present

## 2015-09-30 HISTORY — DX: Personal history of urinary calculi: Z87.442

## 2015-09-30 LAB — COMPREHENSIVE METABOLIC PANEL
ALT: 21 U/L (ref 14–54)
AST: 19 U/L (ref 15–41)
Albumin: 4 g/dL (ref 3.5–5.0)
Alkaline Phosphatase: 33 U/L — ABNORMAL LOW (ref 38–126)
Anion gap: 9 (ref 5–15)
BUN: 9 mg/dL (ref 6–20)
CO2: 23 mmol/L (ref 22–32)
Calcium: 9.6 mg/dL (ref 8.9–10.3)
Chloride: 107 mmol/L (ref 101–111)
Creatinine, Ser: 0.85 mg/dL (ref 0.44–1.00)
GFR calc Af Amer: >60 mL/min (ref >60)
GFR calc non Af Amer: >60 mL/min (ref >60)
Glucose, Bld: 94 mg/dL (ref 65–99)
Potassium: 4.5 mmol/L (ref 3.5–5.1)
Sodium: 139 mmol/L (ref 135–145)
Total Bilirubin: 0.1 mg/dL — ABNORMAL LOW (ref 0.3–1.2)
Total Protein: 6.9 g/dL (ref 6.5–8.1)

## 2015-09-30 LAB — CBC
HCT: 43.1 % (ref 36.0–46.0)
Hemoglobin: 14.4 g/dL (ref 12.0–15.0)
MCH: 29.8 pg (ref 26.0–34.0)
MCHC: 33.4 g/dL (ref 30.0–36.0)
MCV: 89 fL (ref 78.0–100.0)
Platelets: 296 10*3/uL (ref 150–400)
RBC: 4.84 MIL/uL (ref 3.87–5.11)
RDW: 15.2 % (ref 11.5–15.5)
WBC: 8.3 10*3/uL (ref 4.0–10.5)

## 2015-09-30 LAB — SURGICAL PCR SCREEN
MRSA, PCR: NEGATIVE
Staphylococcus aureus: NEGATIVE

## 2015-09-30 NOTE — Pre-Procedure Instructions (Signed)
[name] CancerConnie R Price  09/30/2015      [facility] [phone] [name]- , [place] - [address] [facility] [address] [place] [id number] Phone: [phone] Fax: [phone]    Your procedure is scheduled on   Friday  10/03/15   [facility].M.  Call this number if you have problems the morning of surgery:  [phone]   Remember:  Do not eat food or drink liquids after midnight.  Take these medicines the morning of surgery with A SIP OF WATER    ALBUTEROL INHALER, ALPRAZOLAM, ADDERRALL, FLUOXETINE, LAMICTAL, OXYCODONE IF NEEDED       Do not wear jewelry, make-up or nail polish.  Do not wear lotions, powders, or perfumes.  You may wear deodorant.  Do not shave 48 hours prior to surgery.  Men may shave face and neck.  Do not bring valuables to the hospital.  [facility] is not [facility] any belongings or valuables.  Contacts, dentures or bridgework may not be worn into surgery.  Leave your suitcase in the car.  After surgery it may be brought to your room.  For patients admitted to the hospital, discharge time will be determined by your treatment team.  Patients discharged the day of surgery will not be allowed to drive home.   Name and phone number of your driver:   Special instructions:  [facility] - Preparing for Surgery  Before surgery, you can play an important role.  Because skin is not sterile, your skin needs to be as free of germs as possible.  You can reduce the number of germs on you skin by washing with CHG (chlorahexidine gluconate) soap before surgery.  CHG is an antiseptic cleaner which kills germs and bonds with the skin to continue killing germs even after washing.  Please DO NOT use if you have an allergy to CHG or antibacterial soaps.  If your skin becomes reddened/irritated stop using the CHG and inform your nurse when you arrive at Short Stay.  Do not shave  (including legs and underarms) for at least 48 hours prior to the first CHG shower.  You may shave your face.  Please follow these instructions carefully:   1.  Shower with CHG Soap the night before surgery and the                                morning of Surgery.  2.  If you choose to wash your hair, wash your hair first as usual with your       normal shampoo.  3.  After you shampoo, rinse your hair and body thoroughly to remove the                      Shampoo.  4.  Use CHG as you would any other liquid soap.  You can apply chg directly       to the skin and wash gently with scrungie or a clean washcloth.  5.  Apply the CHG Soap to your body ONLY FROM THE NECK DOWN.        Do not use on open wounds or open sores.  Avoid contact with your eyes,       ears, mouth and genitals (private parts).  Wash genitals (private parts)  with your normal soap.  6.  Wash thoroughly, paying special attention to the area where your surgery        will be performed.  7.  Thoroughly rinse your body with warm water from the neck down.  8.  DO NOT shower/wash with your normal soap after using and rinsing off       the CHG Soap.  9.  Pat yourself dry with a clean towel.            10.  Wear clean pajamas.            11.  Place clean sheets on your bed the night of your first shower and do not        sleep with pets.  Day of Surgery  Do not apply any lotions/deoderants the morning of surgery.  Please wear clean clothes to the hospital/surgery center.    Please read over the following fact sheets that you were given. Pain Booklet, Coughing and Deep Breathing, MRSA Information and Surgical Site Infection Prevention

## 2015-09-30 NOTE — Progress Notes (Signed)
REQUESTED STRESS TEST AND OV FROM  DR. Jacinto HalimGANJI.

## 2015-10-02 MED ORDER — CHLORHEXIDINE GLUCONATE 4 % EX LIQD: 60.0000 mL | Freq: Once | CUTANEOUS | Status: DC

## 2015-10-02 MED ORDER — CEFAZOLIN SODIUM-DEXTROSE 2-3 GM-% IV SOLR
2.0000 g | INTRAVENOUS | Status: AC
  Administered 2015-10-03: 2 g via INTRAVENOUS
  Filled 2015-10-02: qty 50

## 2015-10-03 ENCOUNTER — Encounter (HOSPITAL_COMMUNITY): Payer: Self-pay | Admitting: *Deleted

## 2015-10-03 ENCOUNTER — Inpatient Hospital Stay (HOSPITAL_COMMUNITY): Payer: Medicare Other

## 2015-10-03 ENCOUNTER — Inpatient Hospital Stay (HOSPITAL_COMMUNITY): Payer: Medicare Other | Admitting: Anesthesiology

## 2015-10-03 ENCOUNTER — Observation Stay (HOSPITAL_COMMUNITY)
Admission: RE | Admit: 2015-10-03 | Discharge: 2015-10-04 | Disposition: A | Discharge status: Home / Self Care | Payer: Medicare Other | Source: Ambulatory Visit | Attending: Specialist | Admitting: Specialist

## 2015-10-03 ENCOUNTER — Encounter (HOSPITAL_COMMUNITY)
Admission: RE | Disposition: A | Discharge status: Home / Self Care | Payer: Medicare Other | Source: Ambulatory Visit | Attending: Specialist

## 2015-10-03 DIAGNOSIS — M4722 Other spondylosis with radiculopathy, cervical region: Secondary | ICD-10-CM | POA: Diagnosis not present

## 2015-10-03 DIAGNOSIS — F17210 Nicotine dependence, cigarettes, uncomplicated: Secondary | ICD-10-CM | POA: Insufficient documentation

## 2015-10-03 DIAGNOSIS — Z885 Allergy status to narcotic agent status: Secondary | ICD-10-CM | POA: Insufficient documentation

## 2015-10-03 DIAGNOSIS — F419 Anxiety disorder, unspecified: Secondary | ICD-10-CM | POA: Insufficient documentation

## 2015-10-03 DIAGNOSIS — R918 Other nonspecific abnormal finding of lung field: Secondary | ICD-10-CM | POA: Diagnosis not present

## 2015-10-03 DIAGNOSIS — M797 Fibromyalgia: Secondary | ICD-10-CM | POA: Diagnosis not present

## 2015-10-03 DIAGNOSIS — Z888 Allergy status to other drugs, medicaments and biological substances status: Secondary | ICD-10-CM | POA: Insufficient documentation

## 2015-10-03 DIAGNOSIS — M13852 Other specified arthritis, left hip: Secondary | ICD-10-CM | POA: Insufficient documentation

## 2015-10-03 DIAGNOSIS — G8929 Other chronic pain: Secondary | ICD-10-CM | POA: Insufficient documentation

## 2015-10-03 DIAGNOSIS — M13851 Other specified arthritis, right hip: Secondary | ICD-10-CM | POA: Insufficient documentation

## 2015-10-03 DIAGNOSIS — F319 Bipolar disorder, unspecified: Secondary | ICD-10-CM | POA: Insufficient documentation

## 2015-10-03 DIAGNOSIS — M549 Dorsalgia, unspecified: Secondary | ICD-10-CM | POA: Insufficient documentation

## 2015-10-03 DIAGNOSIS — J449 Chronic obstructive pulmonary disease, unspecified: Secondary | ICD-10-CM | POA: Insufficient documentation

## 2015-10-03 DIAGNOSIS — M4712 Other spondylosis with myelopathy, cervical region: Secondary | ICD-10-CM | POA: Diagnosis present

## 2015-10-03 DIAGNOSIS — Z419 Encounter for procedure for purposes other than remedying health state, unspecified: Secondary | ICD-10-CM

## 2015-10-03 DIAGNOSIS — M7502 Adhesive capsulitis of left shoulder: Secondary | ICD-10-CM | POA: Diagnosis not present

## 2015-10-03 DIAGNOSIS — Z87442 Personal history of urinary calculi: Secondary | ICD-10-CM | POA: Insufficient documentation

## 2015-10-03 DIAGNOSIS — J849 Interstitial pulmonary disease, unspecified: Secondary | ICD-10-CM | POA: Insufficient documentation

## 2015-10-03 DIAGNOSIS — G43909 Migraine, unspecified, not intractable, without status migrainosus: Secondary | ICD-10-CM | POA: Insufficient documentation

## 2015-10-03 HISTORY — PX: ANTERIOR CERVICAL DECOMP/DISCECTOMY FUSION: SHX1161

## 2015-10-03 HISTORY — PX: SHOULDER CLOSED REDUCTION: SHX1051

## 2015-10-03 SURGERY — MANIPULATION, JOINT, SHOULDER, WITH ANESTHESIA
Anesthesia: General | Site: Shoulder | Laterality: Left

## 2015-10-03 MED ORDER — GLYCOPYRROLATE 0.2 MG/ML IJ SOLN
INTRAMUSCULAR | Status: DC | PRN
  Administered 2015-10-03: 0.2 mg via INTRAVENOUS

## 2015-10-03 MED ORDER — OXYCODONE-ACETAMINOPHEN 5-325 MG PO TABS
1.0000 | ORAL_TABLET | ORAL | Status: DC | PRN
  Administered 2015-10-03 – 2015-10-04 (×3): 2 via ORAL
  Filled 2015-10-03 (×3): qty 2

## 2015-10-03 MED ORDER — OXYCODONE HCL 5 MG/5ML PO SOLN: 5.0000 mg | Freq: Once | ORAL | Status: DC | PRN

## 2015-10-03 MED ORDER — ONDANSETRON HCL 4 MG/2ML IJ SOLN
INTRAMUSCULAR | Status: DC | PRN
  Administered 2015-10-03: 4 mg via INTRAVENOUS

## 2015-10-03 MED ORDER — SODIUM CHLORIDE 0.9 % IV SOLN: 250.0000 mL | INTRAVENOUS | Status: DC

## 2015-10-03 MED ORDER — PROPOFOL 10 MG/ML IV BOLUS
INTRAVENOUS | Status: AC
  Filled 2015-10-03: qty 20

## 2015-10-03 MED ORDER — BUPIVACAINE HCL 0.5 % IJ SOLN
INTRAMUSCULAR | Status: DC | PRN
  Administered 2015-10-03: 10 mL
  Administered 2015-10-03: 4 mL

## 2015-10-03 MED ORDER — METHOCARBAMOL 500 MG PO TABS: 500.0000 mg | ORAL_TABLET | Freq: Four times a day (QID) | ORAL | Status: DC | PRN

## 2015-10-03 MED ORDER — PROPOFOL 10 MG/ML IV BOLUS
INTRAVENOUS | Status: DC | PRN
  Administered 2015-10-03: 180 mg via INTRAVENOUS

## 2015-10-03 MED ORDER — BUPIVACAINE HCL (PF) 0.5 % IJ SOLN
INTRAMUSCULAR | Status: AC
  Filled 2015-10-03: qty 30

## 2015-10-03 MED ORDER — LIDOCAINE HCL (CARDIAC) 20 MG/ML IV SOLN
INTRAVENOUS | Status: DC | PRN
  Administered 2015-10-03: 50 mg via INTRAVENOUS

## 2015-10-03 MED ORDER — ZOLPIDEM TARTRATE 5 MG PO TABS: 5.0000 mg | ORAL_TABLET | Freq: Every evening | ORAL | Status: DC | PRN

## 2015-10-03 MED ORDER — ACETAMINOPHEN 325 MG PO TABS: 650.0000 mg | ORAL_TABLET | ORAL | Status: DC | PRN

## 2015-10-03 MED ORDER — EPHEDRINE SULFATE 50 MG/ML IJ SOLN
INTRAMUSCULAR | Status: AC
  Filled 2015-10-03: qty 1

## 2015-10-03 MED ORDER — MENTHOL 3 MG MT LOZG: 1.0000 | LOZENGE | OROMUCOSAL | Status: DC | PRN

## 2015-10-03 MED ORDER — HYDROCODONE-ACETAMINOPHEN 5-325 MG PO TABS: 1.0000 | ORAL_TABLET | ORAL | Status: DC | PRN

## 2015-10-03 MED ORDER — MORPHINE SULFATE (PF) 2 MG/ML IV SOLN
1.0000 mg | INTRAVENOUS | Status: DC | PRN
  Administered 2015-10-03: 2 mg via INTRAVENOUS
  Administered 2015-10-03 – 2015-10-04 (×2): 4 mg via INTRAVENOUS
  Administered 2015-10-04: 2 mg via INTRAVENOUS
  Filled 2015-10-03 (×5): qty 2

## 2015-10-03 MED ORDER — THROMBIN 20000 UNITS EX KIT
PACK | CUTANEOUS | Status: DC | PRN
  Administered 2015-10-03: 20 mL via TOPICAL

## 2015-10-03 MED ORDER — AMPHETAMINE-DEXTROAMPHET ER 10 MG PO CP24
30.0000 mg | ORAL_CAPSULE | Freq: Two times a day (BID) | ORAL | Status: DC
  Administered 2015-10-03 – 2015-10-04 (×2): 30 mg via ORAL
  Filled 2015-10-03 (×2): qty 3

## 2015-10-03 MED ORDER — MIDAZOLAM HCL 5 MG/5ML IJ SOLN
INTRAMUSCULAR | Status: DC | PRN
  Administered 2015-10-03: 2 mg via INTRAVENOUS

## 2015-10-03 MED ORDER — ALUM & MAG HYDROXIDE-SIMETH 200-200-20 MG/5ML PO SUSP: 30.0000 mL | Freq: Four times a day (QID) | ORAL | Status: DC | PRN

## 2015-10-03 MED ORDER — FENTANYL CITRATE (PF) 100 MCG/2ML IJ SOLN
INTRAMUSCULAR | Status: DC | PRN
  Administered 2015-10-03: 50 ug via INTRAVENOUS
  Administered 2015-10-03: 100 ug via INTRAVENOUS
  Administered 2015-10-03: 50 ug via INTRAVENOUS
  Administered 2015-10-03: 100 ug via INTRAVENOUS
  Administered 2015-10-03: 50 ug via INTRAVENOUS
  Administered 2015-10-03: 100 ug via INTRAVENOUS
  Administered 2015-10-03: 50 ug via INTRAVENOUS

## 2015-10-03 MED ORDER — SODIUM CHLORIDE 0.9 % IJ SOLN: 3.0000 mL | INTRAMUSCULAR | Status: DC | PRN

## 2015-10-03 MED ORDER — ALBUTEROL SULFATE (2.5 MG/3ML) 0.083% IN NEBU: 3.0000 mL | INHALATION_SOLUTION | Freq: Four times a day (QID) | RESPIRATORY_TRACT | Status: DC | PRN

## 2015-10-03 MED ORDER — QUETIAPINE FUMARATE 25 MG PO TABS
25.0000 mg | ORAL_TABLET | Freq: Three times a day (TID) | ORAL | Status: DC
  Administered 2015-10-03 – 2015-10-04 (×2): 25 mg via ORAL
  Filled 2015-10-03 (×6): qty 1

## 2015-10-03 MED ORDER — FLUOXETINE HCL 20 MG PO CAPS
40.0000 mg | ORAL_CAPSULE | Freq: Two times a day (BID) | ORAL | Status: DC
  Administered 2015-10-03 – 2015-10-04 (×2): 40 mg via ORAL
  Filled 2015-10-03 (×2): qty 2

## 2015-10-03 MED ORDER — 0.9 % SODIUM CHLORIDE (POUR BTL) OPTIME
TOPICAL | Status: DC | PRN
Start: 2015-10-03 — End: 2015-10-03
  Administered 2015-10-03: 1000 mL

## 2015-10-03 MED ORDER — THROMBIN 20000 UNITS EX SOLR
CUTANEOUS | Status: AC
  Filled 2015-10-03: qty 20000

## 2015-10-03 MED ORDER — PHENOL 1.4 % MT LIQD: 1.0000 | OROMUCOSAL | Status: DC | PRN

## 2015-10-03 MED ORDER — LACTATED RINGERS IV SOLN
INTRAVENOUS | Status: DC | PRN
  Administered 2015-10-03: 12:00:00 via INTRAVENOUS

## 2015-10-03 MED ORDER — PROPRANOLOL HCL 1 MG/ML IV SOLN
INTRAVENOUS | Status: AC
  Filled 2015-10-03: qty 1

## 2015-10-03 MED ORDER — DOCUSATE SODIUM 100 MG PO CAPS
100.0000 mg | ORAL_CAPSULE | Freq: Two times a day (BID) | ORAL | Status: DC
  Administered 2015-10-03 – 2015-10-04 (×2): 100 mg via ORAL
  Filled 2015-10-03 (×2): qty 1

## 2015-10-03 MED ORDER — ZOLPIDEM TARTRATE 5 MG PO TABS
5.0000 mg | ORAL_TABLET | Freq: Every evening | ORAL | Status: DC | PRN
  Administered 2015-10-03: 10 mg via ORAL
  Filled 2015-10-03: qty 2

## 2015-10-03 MED ORDER — ROCURONIUM BROMIDE 100 MG/10ML IV SOLN
INTRAVENOUS | Status: DC | PRN
  Administered 2015-10-03: 30 mg via INTRAVENOUS
  Administered 2015-10-03: 50 mg via INTRAVENOUS

## 2015-10-03 MED ORDER — SODIUM CHLORIDE 0.9 % IJ SOLN
INTRAMUSCULAR | Status: AC
  Filled 2015-10-03: qty 10

## 2015-10-03 MED ORDER — FENTANYL CITRATE (PF) 250 MCG/5ML IJ SOLN
INTRAMUSCULAR | Status: AC
  Filled 2015-10-03: qty 5

## 2015-10-03 MED ORDER — PHENYLEPHRINE HCL 10 MG/ML IJ SOLN
INTRAMUSCULAR | Status: DC | PRN
  Administered 2015-10-03 (×3): 80 ug via INTRAVENOUS

## 2015-10-03 MED ORDER — LACTATED RINGERS IV SOLN
INTRAVENOUS | Status: DC
  Administered 2015-10-03: 11:00:00 via INTRAVENOUS

## 2015-10-03 MED ORDER — SODIUM CHLORIDE 0.9 % IJ SOLN: 3.0000 mL | Freq: Two times a day (BID) | INTRAMUSCULAR | Status: DC

## 2015-10-03 MED ORDER — HYDROMORPHONE HCL 1 MG/ML IJ SOLN: 0.2500 mg | INTRAMUSCULAR | Status: DC | PRN

## 2015-10-03 MED ORDER — ACETAMINOPHEN 650 MG RE SUPP: 650.0000 mg | RECTAL | Status: DC | PRN

## 2015-10-03 MED ORDER — LAMOTRIGINE 100 MG PO TABS
200.0000 mg | ORAL_TABLET | Freq: Two times a day (BID) | ORAL | Status: DC
  Administered 2015-10-03 – 2015-10-04 (×2): 200 mg via ORAL
  Filled 2015-10-03 (×2): qty 2

## 2015-10-03 MED ORDER — POLYETHYLENE GLYCOL 3350 17 G PO PACK
17.0000 g | PACK | Freq: Every day | ORAL | Status: DC | PRN
Start: 2015-10-03 — End: 2015-10-04

## 2015-10-03 MED ORDER — EPHEDRINE SULFATE 50 MG/ML IJ SOLN
INTRAMUSCULAR | Status: DC | PRN
  Administered 2015-10-03 (×2): 20 mg via INTRAVENOUS

## 2015-10-03 MED ORDER — SUMATRIPTAN SUCCINATE 50 MG PO TABS
50.0000 mg | ORAL_TABLET | ORAL | Status: DC | PRN
  Administered 2015-10-03 – 2015-10-04 (×2): 50 mg via ORAL
  Filled 2015-10-03 (×3): qty 1

## 2015-10-03 MED ORDER — PREGABALIN 100 MG PO CAPS
200.0000 mg | ORAL_CAPSULE | Freq: Two times a day (BID) | ORAL | Status: DC
  Administered 2015-10-03 – 2015-10-04 (×2): 200 mg via ORAL
  Filled 2015-10-03 (×2): qty 2

## 2015-10-03 MED ORDER — OXYCODONE HCL 5 MG PO TABS: 5.0000 mg | ORAL_TABLET | Freq: Once | ORAL | Status: DC | PRN

## 2015-10-03 MED ORDER — ALPRAZOLAM 0.5 MG PO TABS
1.0000 mg | ORAL_TABLET | Freq: Four times a day (QID) | ORAL | Status: DC
  Administered 2015-10-03 – 2015-10-04 (×3): 1 mg via ORAL
  Filled 2015-10-03 (×3): qty 2

## 2015-10-03 MED ORDER — ONDANSETRON HCL 4 MG/2ML IJ SOLN: 4.0000 mg | Freq: Four times a day (QID) | INTRAMUSCULAR | Status: DC | PRN

## 2015-10-03 MED ORDER — PHENYLEPHRINE 40 MCG/ML (10ML) SYRINGE FOR IV PUSH (FOR BLOOD PRESSURE SUPPORT)
PREFILLED_SYRINGE | INTRAVENOUS | Status: AC
  Filled 2015-10-03: qty 10

## 2015-10-03 MED ORDER — ZOLPIDEM TARTRATE 5 MG PO TABS: 10.0000 mg | ORAL_TABLET | Freq: Every day | ORAL | Status: DC

## 2015-10-03 MED ORDER — MIDAZOLAM HCL 2 MG/2ML IJ SOLN
INTRAMUSCULAR | Status: AC
  Filled 2015-10-03: qty 2

## 2015-10-03 MED ORDER — BREXPIPRAZOLE 1 MG PO TABS: 3.0000 | ORAL_TABLET | Freq: Every day | ORAL | Status: DC

## 2015-10-03 MED ORDER — CEFAZOLIN SODIUM 1-5 GM-% IV SOLN
1.0000 g | Freq: Three times a day (TID) | INTRAVENOUS | Status: AC
  Administered 2015-10-03 – 2015-10-04 (×2): 1 g via INTRAVENOUS
  Filled 2015-10-03 (×2): qty 50

## 2015-10-03 MED ORDER — METHOCARBAMOL 1000 MG/10ML IJ SOLN
500.0000 mg | Freq: Four times a day (QID) | INTRAMUSCULAR | Status: DC | PRN
  Filled 2015-10-03: qty 5

## 2015-10-03 MED ORDER — ONDANSETRON HCL 4 MG/2ML IJ SOLN
4.0000 mg | INTRAMUSCULAR | Status: DC | PRN
  Administered 2015-10-04: 4 mg via INTRAVENOUS
  Filled 2015-10-03: qty 2

## 2015-10-03 MED ORDER — BUPIVACAINE LIPOSOME 1.3 % IJ SUSP
20.0000 mL | Freq: Once | INTRAMUSCULAR | Status: AC
Start: 2015-10-03 — End: 2015-10-03
  Administered 2015-10-03: 20 mL
  Filled 2015-10-03: qty 20

## 2015-10-03 SURGICAL SUPPLY — 60 items
ADH SKN CLS APL DERMABOND .7 (GAUZE/BANDAGES/DRESSINGS) ×2
BIT DRILL SKYLINE 12MM (BIT) IMPLANT
BUR RND FLUTED 2.5 (BURR) ×1 IMPLANT
CANISTER SUCTION 2500CC (MISCELLANEOUS) ×3 IMPLANT
COLLAR CERV LO CONTOUR FIRM DE (SOFTGOODS) ×1 IMPLANT
COVER SURGICAL LIGHT HANDLE (MISCELLANEOUS) ×3 IMPLANT
DERMABOND ADVANCED (GAUZE/BANDAGES/DRESSINGS) ×1
DERMABOND ADVANCED .7 DNX12 (GAUZE/BANDAGES/DRESSINGS) ×2 IMPLANT
DRAIN TLS ROUND 10FR (DRAIN) IMPLANT
DRAPE C-ARM 42X72 X-RAY (DRAPES) ×1 IMPLANT
DRAPE MICROSCOPE LEICA (MISCELLANEOUS) ×3 IMPLANT
DRAPE POUCH INSTRU U-SHP 10X18 (DRAPES) ×3 IMPLANT
DRAPE SURG 17X23 STRL (DRAPES) ×10 IMPLANT
DRILL BIT SKYLINE 12MM (BIT) ×3
DRSG MEPILEX BORDER 4X4 (GAUZE/BANDAGES/DRESSINGS) ×1 IMPLANT
DURAPREP 6ML APPLICATOR 50/CS (WOUND CARE) ×3 IMPLANT
ELECT BLADE 4.0 EZ CLEAN MEGAD (MISCELLANEOUS)
ELECT COATED BLADE 2.86 ST (ELECTRODE) ×3 IMPLANT
ELECT REM PT RETURN 9FT ADLT (ELECTROSURGICAL) ×3
ELECTRODE BLDE 4.0 EZ CLN MEGD (MISCELLANEOUS) IMPLANT
ELECTRODE REM PT RTRN 9FT ADLT (ELECTROSURGICAL) ×2 IMPLANT
GLOVE BIO SURGEON STRL SZ7 (GLOVE) ×1 IMPLANT
GLOVE BIO SURGEON STRL SZ7.5 (GLOVE) ×1 IMPLANT
GLOVE BIOGEL PI IND STRL 7.5 (GLOVE) IMPLANT
GLOVE BIOGEL PI IND STRL 8 (GLOVE) ×2 IMPLANT
GLOVE BIOGEL PI INDICATOR 7.5 (GLOVE) ×1
GLOVE BIOGEL PI INDICATOR 8 (GLOVE) ×1
GLOVE ECLIPSE 9.0 STRL (GLOVE) ×3 IMPLANT
GLOVE SURG 8.5 LATEX PF (GLOVE) ×3 IMPLANT
GOWN STRL REUS W/ TWL LRG LVL3 (GOWN DISPOSABLE) ×2 IMPLANT
GOWN STRL REUS W/TWL 2XL LVL3 (GOWN DISPOSABLE) ×6 IMPLANT
GOWN STRL REUS W/TWL LRG LVL3 (GOWN DISPOSABLE) ×3
HEAD HALTER (SOFTGOODS) ×3 IMPLANT
KIT BASIN OR (CUSTOM PROCEDURE TRAY) ×3 IMPLANT
KIT ROOM TURNOVER OR (KITS) ×3 IMPLANT
NDL HYPO 25X1 1.5 SAFETY (NEEDLE) IMPLANT
NDL SPNL 20GX3.5 QUINCKE YW (NEEDLE) ×4 IMPLANT
NEEDLE HYPO 25X1 1.5 SAFETY (NEEDLE) ×3 IMPLANT
NEEDLE SPNL 20GX3.5 QUINCKE YW (NEEDLE) ×6 IMPLANT
NS IRRIG 1000ML POUR BTL (IV SOLUTION) ×3 IMPLANT
PACK ORTHO CERVICAL (CUSTOM PROCEDURE TRAY) ×3 IMPLANT
PAD ARMBOARD 7.5X6 YLW CONV (MISCELLANEOUS) ×6 IMPLANT
PIN DISTRACTION 14 (PIN) ×2 IMPLANT
PLATE ONE LEVEL SKYLINE 14MM (Plate) ×1 IMPLANT
SCREW VARIABLE SELF TAP 12MM (Screw) ×4 IMPLANT
SPACER ADV ACF 9MM (Bone Implant) ×1 IMPLANT
SPONGE GAUZE 4X4 12PLY STER LF (GAUZE/BANDAGES/DRESSINGS) ×1 IMPLANT
SPONGE INTESTINAL PEANUT (DISPOSABLE) IMPLANT
SPONGE LAP 4X18 X RAY DECT (DISPOSABLE) IMPLANT
SPONGE SURGIFOAM ABS GEL 100 (HEMOSTASIS) ×3 IMPLANT
SUT ETHILON 4 0 PS 2 18 (SUTURE) ×1 IMPLANT
SUT VIC AB 2-0 CT1 27 (SUTURE) ×3
SUT VIC AB 2-0 CT1 TAPERPNT 27 (SUTURE) ×2 IMPLANT
SUT VIC AB 3-0 X1 27 (SUTURE) ×1 IMPLANT
SUT VICRYL 4-0 PS2 18IN ABS (SUTURE) ×3 IMPLANT
SYR CONTROL 10ML LL (SYRINGE) ×1 IMPLANT
SYSTEM CHEST DRAIN TLS 7FR (DRAIN) ×1 IMPLANT
TOWEL OR 17X24 6PK STRL BLUE (TOWEL DISPOSABLE) ×3 IMPLANT
TOWEL OR 17X26 10 PK STRL BLUE (TOWEL DISPOSABLE) ×3 IMPLANT
WATER STERILE IRR 1000ML POUR (IV SOLUTION) ×3 IMPLANT

## 2015-10-03 NOTE — Interval H&P Note (Signed)
History and Physical Interval Note:  10/03/2015 12:26 PM  [name]  has presented today for surgery, with the diagnosis of Left C5-6 spondylosis  The various methods of treatment have been discussed with the patient and family. After consideration of risks, benefits and other options for treatment, the patient has consented to  Procedure(s): ANTERIOR CERVICAL DISCECTOMY  AND FUSION C5-6 WITH PLATE AND SCREWS, LOCAL AND ALLOGRAFT BONE GRAFT, LEFT SHOULDER CLOSED MANIPULATION AND INJECTION WITH LOCAL ANESTHETIC (N/A) as a surgical intervention .  The patient's history has been reviewed, patient examined, no change in status, stable for surgery.  I have reviewed the patient's chart and labs.  Questions were answered to the patient's satisfaction.     Shaketta Rill E

## 2015-10-03 NOTE — H&P (Signed)
[name] is an 49 y.o. female.   She has been experiencing neck pain, radiation in the left upper extremity.  Her studies have indicated significant multilevel spondylosis changes involving the cervical spine with a study done in 2014 that showed progression of C5-6 and C6-7, degenerative disease with disk osteophyte complexes, mildly flattening the ventral cord.  She had a more recent MRI scan done.  This study done June 22, 2015 showed disk space narrowing at the C5-6, C6-7 level with prominence over the posterior aspect of the disk at the C5-6 level with disk osteophyte compression the thecal sac.  The diameter of the cord appeared to be narrowed at the C5-6 level due to disk osteophyte.  Central portions of the canal narrowed to about 9 mm.  EMG/nerve conduction studies were ordered.  The results of these studies suggest the patient has left sided C5-6 radiculopathy.  Appears to be mild and chronic.  She is currently taking medicines of Percocet on a chronic basis.  She has been treated in pain management in 3 different localities.  This patient's final type report from her EMG/nerve conduction studies is not in the computer as of yet, study done on 08/01/2015 by Dr. Alvester Morin.    ALLERGIES:  Steroids.    Past Medical History  Diagnosis Date  . Abscess     rectal  . Depression   . Fibromyalgia   . Ovarian cyst   . Endometriosis   . Pulmonary infiltrates   . COPD (chronic obstructive pulmonary disease) (HCC)   . Emphysema lung (HCC)   . Chronic bronchitis (HCC)     "I get it q yr" (11/14/2014)  . Migraine     "3 in the last 2 wks" (11/14/2014)  . Arthritis     "back; hips" (11/14/2014)  . Chronic back pain     "all over"  . Bipolar disorder (HCC)   . Anxiety   . ILD (interstitial lung disease) (HCC) 11/2013  . Dyspnea     W/ EXERTION   . History of kidney stones     Past Surgical History  Procedure Laterality Date  . Appendectomy  1993  . Laparoscopic cholecystectomy   08/2007  . Examination under anesthesia  02/15/2012    Procedure: EXAM UNDER ANESTHESIA;  Surgeon: Maisie Fus A. Cornett, MD;  Location: [facility];  Service: General;  Laterality: Left;  Excision of perineal cyst  . Video bronchoscopy Bilateral 02/15/2014    Procedure: VIDEO BRONCHOSCOPY WITH FLUORO;  Surgeon: Coralyn Helling, MD;  Location: [facility];  Service: Cardiopulmonary;  Laterality: Bilateral;  . Laparoscopic lysis of adhesions  06/2010    Hattie Perch 06/18/2010  . Transobturator sling  09/2008    Transobturator mid urethral sling, cystoscopy, laparoscopy w/LOA/notes 03/04/2011  . Cyst excision perineal Left 01/2012    Hattie Perch 02/15/2012  . Breast biopsy Left 2008  . Breast lumpectomy Left 2008    "benign"  . Vaginal hysterectomy  1993  . Tubal ligation  1993  . Ovarian cyst removal  X 2-3  . Left oophorectomy Left 08/2013    Family History  Problem Relation Age of Onset  . Cancer Father     lung  . COPD Father   . Early death Father   . Other Neg Hx   . Hypertension Sister   . Cancer Maternal Aunt     LUNG CANCER   Social History:  reports that she has been smoking Cigarettes.  She has a 32 pack-year smoking history.  She has never used smokeless tobacco. She reports that she does not drink alcohol or use illicit drugs.  Allergies:  Allergies  Allergen Reactions  . Cortisone Acetate Shortness Of Breath and Other (See Comments)    Makes patient feel as if she is choking.  . Vicodin [Hydrocodone-Acetaminophen] Itching    No prescriptions prior to admission    No results found for this or any previous visit (from the past 48 hour(s)). No results found.  Review of Systems  Constitutional: Negative.   Eyes: Negative.   Respiratory: Negative.   Cardiovascular: Negative.   Gastrointestinal: Negative.   Genitourinary: Negative.   Musculoskeletal: Positive for neck pain.  Skin: Negative.   Psychiatric/Behavioral: Negative.     There were no vitals taken for  this visit. Physical Exam  Constitutional: She is oriented to person, place, and time. No distress.  HENT:  Head: Atraumatic.  Eyes: EOM are normal.  Cardiovascular: Normal rate.   Respiratory: No respiratory distress.  GI: She exhibits no distension.  Musculoskeletal: She exhibits tenderness.  Neurological: She is alert and oriented to person, place, and time.  Skin: Skin is warm and dry.  Psychiatric: She has a normal mood and affect.   PHYSICAL EXAMINATION:  She does show weakness in the C7 distribution on the left side.  She has some weakness and wrist volar flexion as well on this left side.  Biceps is weak.  It is possible that she may have some crossover that occurs in her cervical spine and she may have a pre or post-fixed condition.  Clinically, her exam today shows she does have limitation of abduction, external rotation, limited by about 30 degrees.  Only about 140 to 130 degrees of abduction and only about 60 degrees of external rotation.  This should improve with manipulation using a local anesthetic intraarticular.    RADIOGRAPHS: The findings on her MR study suggests a primary problem of stenosis and left-sided findings are at the C5-6 level, not at the C6-7 level.  At C6-7, she has some mild disk degeneration, some early uncinate spurring with mild stenosis here.  At C5-6 though, she has chronic spondylosis with moderate left foraminal encroachment and mild right foraminal narrowing with mild disk degeneration and spurring at C6-7 unchanged with no acute findings there.      ASSESSMENT:  This patient has findings consistent with degenerative disk disease.  She has some spondylosis findings at many segments in the cervical spine when reviewing her study.  Most significant though is C5-6 and C6-7.    PLAN:  In the interest of preserving motion, I would recommend against fusing 2 levels in her neck.  I think the likelihood that she would require more surgery with a 2-level fusion is  quite great.  My tendency then would be to perform anterior discectomy and fusion of the C5-6 level with hopes of relieving her discomfort in the left upper extremity, although I believe that she will probably have persistent pain in her neck associated with degenerative changes at the C6-7 level.  Risks of surgery include risks of infection, bleeding, neurologic compromise.  She also has significant discomfort in the left shoulder and pain associated with the left shoulder.  As she is having persistent discomfort following left shoulder arthroscopy, I think it would be prudent to perform a left shoulder manipulation using local anesthetic so that she might undergo a physical therapy program for her left shoulder in addition to treatment of her cervical condition.  Dr. Warren DanesXu's  notes regarding her left shoulder indicated that she had left rotator cuff tendinopathy with impingement, type 2 acromion process and sub acromion bursitis.  Will go ahead and schedule this patient for anterior discectomy and fusion at C5-6 and to have at the same setting, a left shoulder closed manipulation with intraarticular injection of a local anesthetic.  Risks of surgery including risks of infection, bleeding, neurologic compromise discussed with Ms. Chopra.  She indicates that she has an allergy to steroid medicine, so we will avoid any use of steroid in the injection of her shoulder.  Go ahead and see if we can improve upon her pain pattern for which she has  been on a rather strong narcotic medicine since earlier this year in the form of Percocet.  I again have indicated that Kaylan presented with problems in her thumb, discomfort in her shoulder.  Now, we are treating her for her neck and problems with shoulder stiffness and persistent pain.  I think she does have some intrinsic left shoulder adhesive capsulitis.  I also think though she does have persistent cervical radiculopathy by EMG and nerve conduction studies and findings on  her MRI scan that suggest a cause for this, which I think are treatable.  Will plan to see her back in followup in 2 weeks following her surgery.  She will need physical therapy in regards to her shoulder after manipulation.   OWENS,Omar Gayden M 10/03/2015, 7:00 AM

## 2015-10-03 NOTE — Op Note (Signed)
10/03/2015  3:34 PM  PATIENT:  [name]  49 y.o. female  MRN: [id number]  OPERATIVE REPORT  PRE-OPERATIVE DIAGNOSIS:  Left C5-6 spondylosis  POST-OPERATIVE DIAGNOSIS:  Left C5-6 spondylosis  PROCEDURE:  Procedure(s): CLOSED MANIPULATION SHOULDER CLOSED  AND INJECTION WITH LOCAL ANESTHETIC ANTERIOR CERVICAL DISCECTOMY  AND FUSION C5-6 WITH PLATE AND SCREWS, LOCAL AND ALLOGRAFT BONE GRAFT    SURGEON:  Jessy Oto, MD     ASSISTANT:  Benjiman Core, PA-C  (Present throughout the entire procedure and necessary for completion of procedure in a timely manner)     ANESTHESIA:  General,    COMPLICATIONS:  None.     COMPONENTS:  Implant Name Type Inv. Item Serial No. Manufacturer Lot No. LRB No. Used  SPACER ADV ACF 9MM - [id number] Bone Implant SPACER ADV ACF 9MM [id number] MUSCULOSKELETL TRANSPLANT FNDN   1  PLATE ONE LEVEL SKYLINE 14MM - [id number] Plate PLATE ONE LEVEL SKYLINE 14MM  DEPUY SPINE   1  SCREW VARIABLE SELF TAP 12MM - [id number] Screw SCREW VARIABLE SELF TAP 12MM   DEPUY SPINE     4    PROCEDURE:The patient was met in the holding area, and the appropriate  left C5.6 cervical level identified and marked with an "x" and my initials. The patient was then transported to OR and was placed on the operative table in a supine position head supported on the well padded Mayfield horseshoe. The patient was then placed under  general anesthesia without difficulty intubated atraumaticly.  The left shoulder evaluated, under general anesthesia the left shoulder could be anterior flexed  150 degrees and externally rotated 80 degrees. While under anesthesia the left shoulder was able To be brought to a position of abduction and external rotation of 180 and 90 degrees. Then returning the left arm to the patient's left side. The landmarks for the coracoid process and the left  A-C joint marked. The area over the intersection of a vertical line with the A-C joint and horizontal  line with the coracoid process was prepped with hibeclens and alcohol and the left glenohumeral joint injected with 10 cc of 0.5% marcaine. The left shoulder returned to the patient's left side.     Cervical spine was positioned with a Mayfield horseshoe and 5 pound cervical halter traction. All pressure points well padded and semi-beach chair position. Arm holder both arms. Standard prep with DuraPrep solution the anterior cervical spine chest. Draped in the usual manner. Iodine vi drape was used. Standard timeout protocol was carried out identifying the patient procedure side of the procedure and level. The skin the left neck was infiltrated with Marcaine 0.5% 1:1 Exparel 1.3% total of 10 cc. This at the level of expected C5-6 incision and also along a skin crease in line with the patients lines of Langer. Incision transverse at the C6 level and carried down to the level of the platysma. Then was carried down to the anterior aspect of the sternocleidomastoid muscle. The interval between the trachea and esophagus medially and the carotid sheath laterally was developed as a Metzenbaum scissors and blunt dissection exposing the anterior aspect of cervical spine at the C6 level.  The prevertebral fascia anterior to the cervical was cauterized with bipolar and teased across the midline with a Art therapist. An 18-gauge spinal needle was placed with sheath intact allowing only a centimeter to extend into the C5-6 disc and observed on lateral radiogragh  at the C5-6 level. Handheld Cloward retraction of the soft  tissues while identifying the level at C5-6 and also while removing a portion of the anterior aspect of the disc with15 blade scalpel and pituitary. Medial border of the longus collie muscles was carefully elevated bilaterally and self-retaining retractors were introduced the foot of the blade beneath the medial border of the longus colli muscles. Soft tissue overlying the anterior borders of the disc space  at C5-6 level carefully debridement of soft tissue back to bony edges. The anterior lip osteophytes were then resected using rongeur. This bone was preserved for later bone grafting purposes. The disc space was then first prepared using loupe magnification and headlight with resection of degenerative disc annulus anteriorly and posteriorly and cartilaginous endplates using micro-curettes pituitaries and a high-speed bur. The operating room microscope was draped sterilely and brought into the field. Under the operating room microscope and posterior aspect of the disc was excised using micro-curettes pituitary rongeur and times per. Posterior lip osteophytes were drilled using a high-speed bur and a carefully resected with 1 and 2 mm Kerrison foraminotomy performed over both C6 nerve roots using 1 and 2 mm Kerrisons disc osteophyte material was noted centrally and into the left foramen some of which represented reflected portion of the patient's annulus into the neuroforamen. Following decompression of the spinal cord and both C6 nerve roots, irrigation was carried out. The endplates of the inferior aspect of C5 and superior aspect of C6 were carefully prepared using a high-speed bur to parallel. The disc space was then sounded utilized and a precontoured sounder for the transgraft implant. Surrounding was carried up to a 48m implant.  9 mm ACDF allograft spacer was felt to provide best fit for the C5-6 disc space. The transgraft permanent lordotic allograft implant was then brought onto the field. It was then packed with local bone graft that had been harvested previously. The implant was then carefully placed over the intervertebral disc space at C5-6 level. Care taken to ensure that no bone or soft tissue debris within the disc space that could be retropulsed with insertion of the bone graft. The implant was then impacted into place with the head placed in longitudinal cervical traction.  The implant was in  excellent position alignment. Cervical distraction instrumentation was removed and the screw post holes coated with wax for hemostasis. Length of the plate was then chosen using bone wax applied to a cottonoid string and measuring from the edge of the lower cervical vertebral border of C5 the upper cervical border of C6  A  14 millimeter plate was chosen. 12 mm screws were then placed into the C5  locking the plate in place. Additional 12 mm screws were then placed in the C6 level  cervical traction had been released prior to screw placement. Intraoperative lateral C arm demonstrated the plates and screws in good position alignment no sign of impingement upon the cervical canal. The graft appeared in good position alignment.  Upper extremity longitudinal traction using wrist restraints was necessary to obtain visualization of the lower cervical level.  This point irrigation was carried out at cervical incision site.The esophagus examined at the cervical level  and found to be normal. Irrigation was again carried out there was no active bleeding present. The incisions were then closed by approximating the deep subcutaneous layers the platysma layer with interrupted 3-0 Vicryl suture and the superficial fascia overlying the sternocleidomastoid muscle with interrupted 3-0 Vicryl sutures. The subcutaneous layers were approximated with interrupted 3-0 Vicryl sutures as were the superficial layers.  The skin was closed with a running subcutaneous stitch of 4-0 Vicryl at the operative C6 transverse incision site. Skin was approximated with Dermabond. Mepilex bandage was applied. A Philadelphia collar was then applied to the cervical spine released on the cervical spine and drapes were removed. Physician assistant's responsibilities:James Ricard Dillon, PA-C perform the duties of assistant physician and surgeon during this case present from the beginning of the case to the end of the case. He assisted with careful retraction of  neural structures suctioning about her elements including cervical cord and C6 nerve root. Performed closure of the incision on the ligamentum nuchae to the skin and application of dressing. He assisted in positioning the patient had removal the patient from the OR table to her stretcher.     NITKA,JAMES E 10/03/2015, 3:34 PM

## 2015-10-03 NOTE — Discharge Instructions (Signed)
° ° °  No lifting greater than 10 lbs. No overhead use of arms. Avoid bending,and twisting neck. Walk in house for first week them may start to get out slowly increasing distance up to one  Quarter mile by 3 weeks post op. Keep incision dry for 3 days, may then bathe and wet incision using a Philadelphia collar when showering. Call if any fevers >101, chills, or increasing numbness or weakness or increased swelling or drainage.

## 2015-10-03 NOTE — Brief Op Note (Signed)
10/03/2015  3:29 PM  PATIENT:  [name]  49 y.o. female  PRE-OPERATIVE DIAGNOSIS:  Left C5-6 spondylosis  POST-OPERATIVE DIAGNOSIS:  Left C5-6 spondylosis  PROCEDURE:  Procedure(s): CLOSED MANIPULATION SHOULDER CLOSED  AND INJECTION WITH LOCAL ANESTHETIC (Left) ANTERIOR CERVICAL DISCECTOMY  AND FUSION C5-6 WITH PLATE AND SCREWS, LOCAL AND ALLOGRAFT BONE GRAFT  SURGEON:  Surgeon(s) and Role: Kerrin ChampagneJames E Moreen Piggott, MD - Primary  PHYSICIAN ASSISTANT:Stacia Feazell Barry Dieneswens, PA-C    ANESTHESIA:   local and general, Dr. Chaney MallingHodierne  EBL:     BLOOD ADMINISTERED:none  DRAINS: (7 french) TLS Drain(s) to suction in the anterior left neck   LOCAL MEDICATIONS USED:  MARCAINE 0.5% 1:1 EXPAREKL1.3% Amount: 10 ml left neck, MARCAINE 0.5% Amount 10cc left shoulder.  SPECIMEN:  No Specimen  DISPOSITION OF SPECIMEN:  N/A  COUNTS:  YES  TOURNIQUET:  * No tourniquets in log *  DICTATION: .Dragon Dictation  PLAN OF CARE: Admit for overnight observation  PATIENT DISPOSITION:  PACU - hemodynamically stable.   Delay start of Pharmacological VTE agent (>24hrs) due to surgical blood loss or risk of bleeding: yes

## 2015-10-03 NOTE — Anesthesia Preprocedure Evaluation (Signed)
Anesthesia Evaluation  Patient identified by MRN, date of birth, ID band Patient awake    Reviewed: Allergy & Precautions, NPO status , Patient's Chart, lab work & pertinent test results  Airway Mallampati: II   Neck ROM: limited    Dental   Pulmonary shortness of breath, COPD, Current Smoker,    breath sounds clear to auscultation       Cardiovascular negative cardio ROS   Rhythm:regular Rate:Normal     Neuro/Psych    GI/Hepatic   Endo/Other    Renal/GU      Musculoskeletal  (+) Arthritis , Fibromyalgia -  Abdominal   Peds  Hematology   Anesthesia Other Findings   Reproductive/Obstetrics                             Anesthesia Physical Anesthesia Plan  ASA: III  Anesthesia Plan: General   Post-op Pain Management:    Induction: Intravenous  Airway Management Planned: Oral ETT  Additional Equipment:   Intra-op Plan:   Post-operative Plan: Extubation in OR  Informed Consent: I have reviewed the patients History and Physical, chart, labs and discussed the procedure including the risks, benefits and alternatives for the proposed anesthesia with the patient or authorized representative who has indicated his/her understanding and acceptance.     Plan Discussed with: CRNA, Anesthesiologist and Surgeon  Anesthesia Plan Comments:         Anesthesia Quick Evaluation

## 2015-10-03 NOTE — Transfer of Care (Signed)
Immediate Anesthesia Transfer of Care Note  Patient: [name]  Procedure(s) Performed: Procedure(s): CLOSED MANIPULATION SHOULDER CLOSED  AND INJECTION WITH LOCAL ANESTHETIC (Left) ANTERIOR CERVICAL DISCECTOMY  AND FUSION C5-6 WITH PLATE AND SCREWS, LOCAL AND ALLOGRAFT BONE GRAFT  Patient Location: PACU  Anesthesia Type:General  Level of Consciousness: awake  Airway & Oxygen Therapy: Patient Spontanous Breathing and Patient connected to nasal cannula oxygen  Post-op Assessment: Report given to RN and Post -op Vital signs reviewed and stable  Post vital signs: Reviewed and stable  Last Vitals:  Filed Vitals:   10/03/15 1024 10/03/15 1556  BP: 103/60 128/75  Pulse: 53 78  Temp: 36.6 C 36.5 C  Resp: 16 15    Complications: No apparent anesthesia complications

## 2015-10-03 NOTE — Anesthesia Procedure Notes (Signed)
Procedure Name: Intubation Date/Time: 10/03/2015 12:47 PM Performed by: Gavin PoundLOWDER, Julie Alomar J Pre-anesthesia Checklist: Patient identified, Emergency Drugs available, Suction available, Patient being monitored and Timeout performed Patient Re-evaluated:Patient Re-evaluated prior to inductionOxygen Delivery Method: Circle system utilized Preoxygenation: Pre-oxygenation with 100% oxygen Intubation Type: IV induction Ventilation: Mask ventilation without difficulty Laryngoscope Size: Mac and 3 Grade View: Grade I Tube type: Oral Tube size: 7.0 mm Number of attempts: 1 Placement Confirmation: ETT inserted through vocal cords under direct vision,  positive ETCO2 and breath sounds checked- equal and bilateral Secured at: 21 cm Tube secured with: Tape Dental Injury: Teeth and Oropharynx as per pre-operative assessment

## 2015-10-04 DIAGNOSIS — M4722 Other spondylosis with radiculopathy, cervical region: Secondary | ICD-10-CM | POA: Diagnosis present

## 2015-10-04 MED ORDER — OXYCODONE HCL 10 MG PO TABS: 10.0000 mg | ORAL_TABLET | ORAL | Status: DC | PRN

## 2015-10-04 MED ORDER — OXYCODONE HCL 5 MG PO TABS: 10.0000 mg | ORAL_TABLET | ORAL | Status: DC | PRN

## 2015-10-04 MED ORDER — ONDANSETRON HCL 8 MG PO TABS: 8.0000 mg | ORAL_TABLET | Freq: Three times a day (TID) | ORAL | Status: DC | PRN

## 2015-10-04 MED ORDER — PROMETHAZINE HCL 25 MG PO TABS: 50.0000 mg | ORAL_TABLET | Freq: Four times a day (QID) | ORAL | Status: DC | PRN

## 2015-10-04 NOTE — Progress Notes (Signed)
Patient discharged home with family. Prescriptions given. Discharge instructions given.  

## 2015-10-04 NOTE — Discharge Summary (Signed)
Physician Discharge Summary      Patient ID: Julie CancerConnie R Stueve MRN: [id number] DOB/AGE: Nov 09, 1965 49 y.o.  Admit date: 10/03/2015 Discharge date:   Admission Diagnoses:  Principal Problem:   Spondylosis, cervical, with myelopathy Active Problems:   Secondary adhesive capsulitis of left shoulder   Cervical spondylosis with radiculopathy   Discharge Diagnoses:  Same  Past Medical History  Diagnosis Date  . Abscess     rectal  . Depression   . Fibromyalgia   . Ovarian cyst   . Endometriosis   . Pulmonary infiltrates   . COPD (chronic obstructive pulmonary disease) (HCC)   . Emphysema lung (HCC)   . Chronic bronchitis (HCC)     "I get it q yr" (11/14/2014)  . Migraine     "3 in the last 2 wks" (11/14/2014)  . Arthritis     "back; hips" (11/14/2014)  . Chronic back pain     "all over"  . Bipolar disorder (HCC)   . Anxiety   . ILD (interstitial lung disease) (HCC) 11/2013  . Dyspnea     W/ EXERTION   . History of kidney stones     Surgeries: Procedure(s): CLOSED MANIPULATION SHOULDER CLOSED  AND INJECTION WITH LOCAL ANESTHETIC ANTERIOR CERVICAL DISCECTOMY  AND FUSION C5-6 WITH PLATE AND SCREWS, LOCAL AND ALLOGRAFT BONE GRAFT on 10/03/2015   Consultants:    Discharged Condition: Improved  Hospital Course: [name] is an 49 y.o. female who was admitted 10/03/2015 with a chief complaint of No chief complaint on file. , and found to have a diagnosis of Spondylosis, cervical, with myelopathy.  She was brought to the operating room on 10/03/2015 and underwent the above named procedures.    She was given perioperative antibiotics:  Anti-infectives    Start     Dose/Rate Route Frequency Ordered Stop   10/03/15 1800  ceFAZolin (ANCEF) IVPB 1 g/50 mL premix     1 g 100 mL/hr over 30 Minutes Intravenous Every 8 hours 10/03/15 1640 10/04/15 0307   10/03/15 0600  ceFAZolin (ANCEF) IVPB 2 g/50 mL premix     2 g 100 mL/hr over 30 Minutes Intravenous On call to O.R.  10/02/15 1213 10/03/15 1235    Post operatively she recovered uneventfully in the PACU then was transferred to the med-surg floor. Pain controlled with IV and PO narcotic medications. POD#1 dressing changed, TLS drain Found between patients leg under tension but intact. Drain removed and dressing changed no Significant drainage. Voiding without difficulty. She was neurovascularly normal. No motor or sensory deficit. Complaints of Sore throat and discomfort appropriate for ACDF. All questions answered. She was discharged home on POD #1.  She was given sequential compression devices and early ambulation for DVT prophylaxis.  She maximally from their hospital stay and there were no complications.    Recent vital signs:  Filed Vitals:   10/04/15 0053 10/04/15 0546  BP: 114/72 103/57  Pulse: 65 77  Temp: 97.5 F (36.4 C) 97.8 F (36.6 C)  Resp: 16 16    Recent laboratory studies:  Results for orders placed or performed during the hospital encounter of 09/30/15  Surgical pcr screen  Result Value Ref Range   MRSA, PCR NEGATIVE NEGATIVE   Staphylococcus aureus NEGATIVE NEGATIVE  CBC  Result Value Ref Range   WBC 8.3 4.0 - 10.5 K/uL   RBC 4.84 3.87 - 5.11 MIL/uL   Hemoglobin 14.4 12.0 - 15.0 g/dL   HCT 40.943.1 81.136.0 - 91.446.0 %  MCV 89.0 78.0 - 100.0 fL   MCH 29.8 26.0 - 34.0 pg   MCHC 33.4 30.0 - 36.0 g/dL   RDW 16.1 09.6 - 04.5 %   Platelets 296 150 - 400 K/uL  Comprehensive metabolic panel  Result Value Ref Range   Sodium 139 135 - 145 mmol/L   Potassium 4.5 3.5 - 5.1 mmol/L   Chloride 107 101 - 111 mmol/L   CO2 23 22 - 32 mmol/L   Glucose, Bld 94 65 - 99 mg/dL   BUN 9 6 - 20 mg/dL   Creatinine, Ser 4.09 0.44 - 1.00 mg/dL   Calcium 9.6 8.9 - 81.1 mg/dL   Total Protein 6.9 6.5 - 8.1 g/dL   Albumin 4.0 3.5 - 5.0 g/dL   AST 19 15 - 41 U/L   ALT 21 14 - 54 U/L   Alkaline Phosphatase 33 (L) 38 - 126 U/L   Total Bilirubin 0.1 (L) 0.3 - 1.2 mg/dL   GFR calc non Af Amer >60 >60  mL/min   GFR calc Af Amer >60 >60 mL/min   Anion gap 9 5 - 15    Discharge Medications:     Medication List    TAKE these medications        albuterol 108 (90 BASE) MCG/ACT inhaler  Commonly known as:  PROVENTIL HFA;VENTOLIN HFA  Inhale 2 puffs into the lungs every 6 (six) hours as needed for wheezing or shortness of breath.     ALPRAZolam 1 MG tablet  Commonly known as:  XANAX  Take 1 mg by mouth 4 (four) times daily.     amphetamine-dextroamphetamine 25 MG 24 hr capsule  Commonly known as:  ADDERALL XR  Take 1 capsule by mouth 2 (two) times daily.     FLUoxetine 40 MG capsule  Commonly known as:  PROZAC  Take 40 mg by mouth 2 (two) times daily.     lamoTRIgine 200 MG tablet  Commonly known as:  LAMICTAL  Take 200 mg by mouth 2 (two) times daily.     LYRICA 200 MG capsule  Generic drug:  pregabalin  Take 1 capsule by mouth 2 (two) times daily.     ondansetron 8 MG tablet  Commonly known as:  ZOFRAN  Take 1 tablet (8 mg total) by mouth every 8 (eight) hours as needed for nausea or vomiting.     Oxycodone HCl 10 MG Tabs  Take 1 tablet (10 mg total) by mouth every 3 (three) hours as needed for severe pain.     QUEtiapine 25 MG tablet  Commonly known as:  SEROQUEL  Take 1 tablet by mouth 3 (three) times daily as needed.     REXULTI 1 MG Tabs  Generic drug:  Brexpiprazole  Take 3 tablets by mouth at bedtime.     rizatriptan 10 MG tablet  Commonly known as:  MAXALT  Take 20 mg by mouth daily as needed for migraine. May repeat in 2 hours if needed     zolpidem 10 MG tablet  Commonly known as:  AMBIEN  Take 10 mg by mouth at bedtime. May take 1-1 tablets at hs prn        Diagnostic Studies: Dg Cervical Spine 1 View  10/03/2015  CLINICAL DATA:  49 year old female undergoing cervical spine surgery. Initial encounter. EXAM: DG C-ARM 61-120 MIN; DG CERVICAL SPINE - 1 VIEW COMPARISON:  MRI 06/22/2015. FLUOROSCOPY TIME:  0 minutes 7 seconds FINDINGS: Intraoperative  lateral fluoroscopic view of the cervical  spine demonstrates C5-C6 ACDF hardware. IMPRESSION: C5-C6 ACDF depicted. Electronically Signed   By: Odessa Fleming M.D.   On: 10/03/2015 15:45   Dg C-arm 1-60 Min  10/03/2015  CLINICAL DATA:  49 year old female undergoing cervical spine surgery. Initial encounter. EXAM: DG C-ARM 61-120 MIN; DG CERVICAL SPINE - 1 VIEW COMPARISON:  MRI 06/22/2015. FLUOROSCOPY TIME:  0 minutes 7 seconds FINDINGS: Intraoperative lateral fluoroscopic view of the cervical spine demonstrates C5-C6 ACDF hardware. IMPRESSION: C5-C6 ACDF depicted. Electronically Signed   By: Odessa Fleming M.D.   On: 10/03/2015 15:45    Disposition: 01-Home or Self Care      Discharge Instructions    Call MD / Call 911    Complete by:  As directed   If you experience chest pain or shortness of breath, CALL 911 and be transported to the hospital emergency room.  If you develope a fever above 101 F, pus (white drainage) or increased drainage or redness at the wound, or calf pain, call your surgeon's office.     Constipation Prevention    Complete by:  As directed   Drink plenty of fluids.  Prune juice may be helpful.  You may use a stool softener, such as Colace (over the counter) 100 mg twice a day.  Use MiraLax (over the counter) for constipation as needed.     Diet - low sodium heart healthy    Complete by:  As directed      Discharge instructions    Complete by:  As directed   No lifting greater than 10 lbs. No overhead use of arms. Avoid bending,and twisting neck. Walk in house for first week them may start to get out slowly increasing distance up to one quarter mile by 3 weeks post op. Keep incision dry for 3 days, may then bathe and wet incision using a Philadelphia collar when showering. Call if any fevers >101, chills, or increasing numbness or weakness or increased swelling or drainage.     Driving restrictions    Complete by:  As directed   No driving for 3 weeks     Increase activity slowly  as tolerated    Complete by:  As directed      Lifting restrictions    Complete by:  As directed   No lifting for 6 weeks           Follow-up Information    Follow up with Demetrie Borge E, MD In 2 weeks.   Specialty:  Orthopedic Surgery   Contact information:   [address] [place] [address] [phone]        Signed: Kerrin Champagne 10/04/2015, 8:48 AM

## 2015-10-04 NOTE — Progress Notes (Signed)
     Subjective: 1 Day Post-Op Procedure(s) (LRB): CLOSED MANIPULATION SHOULDER CLOSED  AND INJECTION WITH LOCAL ANESTHETIC (Left) ANTERIOR CERVICAL DISCECTOMY  AND FUSION C5-6 WITH PLATE AND SCREWS, LOCAL AND ALLOGRAFT BONE GRAFT Awake, alert and oriented x 4. I feel nauseated, no emesis. The morphine doesn't last long. No One came by to see me. Patient reports pain as moderate.    Objective:   VITALS:  Temp:  [97.5 F (36.4 C)-97.8 F (36.6 C)] 97.8 F (36.6 C) (12/03 0546) Pulse Rate:  [53-78] 77 (12/03 0546) Resp:  [14-17] 16 (12/03 0546) BP: (103-128)/(57-79) 103/57 mmHg (12/03 0546) SpO2:  [94 %-97 %] 95 % (12/03 0546)  Neurologically intact ABD soft Neurovascular intact Sensation intact distally Intact pulses distally Dorsiflexion/Plantar flexion intact Incision: no drainage and Dressing changed. TLS red top tube under tension between her legs. She delivered to dressing and I removed suture and removed the drain. No cellulitis present   LABS No results for input(s): HGB, WBC, PLT in the last 72 hours. No results for input(s): NA, K, CL, CO2, BUN, CREATININE, GLUCOSE in the last 72 hours. No results for input(s): LABPT, INR in the last 72 hours.   Assessment/Plan: 1 Day Post-Op Procedure(s) (LRB): CLOSED MANIPULATION SHOULDER CLOSED  AND INJECTION WITH LOCAL ANESTHETIC (Left) ANTERIOR CERVICAL DISCECTOMY  AND FUSION C5-6 WITH PLATE AND SCREWS, LOCAL AND ALLOGRAFT BONE GRAFT  Advance diet Up with therapy D/C IV fluids Discharge home with home health  [name] E 10/04/2015, 8:40 AM

## 2015-10-06 ENCOUNTER — Encounter (HOSPITAL_COMMUNITY): Payer: Self-pay | Admitting: Specialist

## 2015-10-06 ENCOUNTER — Encounter: Payer: Medicare Other | Attending: Registered Nurse | Admitting: Registered Nurse

## 2015-10-06 DIAGNOSIS — G894 Chronic pain syndrome: Secondary | ICD-10-CM | POA: Insufficient documentation

## 2015-10-06 DIAGNOSIS — M25512 Pain in left shoulder: Secondary | ICD-10-CM | POA: Diagnosis not present

## 2015-10-06 DIAGNOSIS — Z79899 Other long term (current) drug therapy: Secondary | ICD-10-CM | POA: Insufficient documentation

## 2015-10-06 DIAGNOSIS — J449 Chronic obstructive pulmonary disease, unspecified: Secondary | ICD-10-CM | POA: Insufficient documentation

## 2015-10-06 DIAGNOSIS — F17210 Nicotine dependence, cigarettes, uncomplicated: Secondary | ICD-10-CM | POA: Diagnosis not present

## 2015-10-06 DIAGNOSIS — M797 Fibromyalgia: Secondary | ICD-10-CM | POA: Diagnosis not present

## 2015-10-06 DIAGNOSIS — M542 Cervicalgia: Secondary | ICD-10-CM | POA: Diagnosis not present

## 2015-10-06 DIAGNOSIS — Z5181 Encounter for therapeutic drug level monitoring: Secondary | ICD-10-CM | POA: Diagnosis not present

## 2015-10-06 MED FILL — Thrombin For Soln 20000 Unit: CUTANEOUS | Qty: 1 | Status: AC

## 2015-10-08 NOTE — Anesthesia Postprocedure Evaluation (Signed)
Anesthesia Post Note  Patient: Baird CancerConnie R Opie  Procedure(s) Performed: Procedure(s) (LRB): CLOSED MANIPULATION SHOULDER CLOSED  AND INJECTION WITH LOCAL ANESTHETIC (Left) ANTERIOR CERVICAL DISCECTOMY  AND FUSION C5-6 WITH PLATE AND SCREWS, LOCAL AND ALLOGRAFT BONE GRAFT  Patient location during evaluation: PACU Anesthesia Type: General Level of consciousness: awake and alert Pain management: pain level controlled Vital Signs Assessment: post-procedure vital signs reviewed and stable Respiratory status: spontaneous breathing, nonlabored ventilation, respiratory function stable and patient connected to nasal cannula oxygen Cardiovascular status: blood pressure returned to baseline and stable Postop Assessment: no signs of nausea or vomiting Anesthetic complications: no                  Reino KentJudd, Jenesis Martin J

## 2015-10-21 ENCOUNTER — Telehealth: Payer: Self-pay | Admitting: Physical Medicine & Rehabilitation

## 2015-10-21 NOTE — Telephone Encounter (Signed)
Patient is calling otdays to let us know that she is still under Dr Barbaraann FasterNitka's care for the time ebing - she is ok and she did have the surgery She sees Dr Otelia SergeantNitka again on the 19th for a follow up  Any questions - please call the patient

## 2015-11-27 IMAGING — DX DG CHEST 2V
2 series · 2 of 2 positions shown · non-contrast
Comparison: 02/08/2014

CLINICAL DATA: Chest pain, shortness of breath for 3 days

EXAM:
CHEST  2 VIEW

[chest pa]
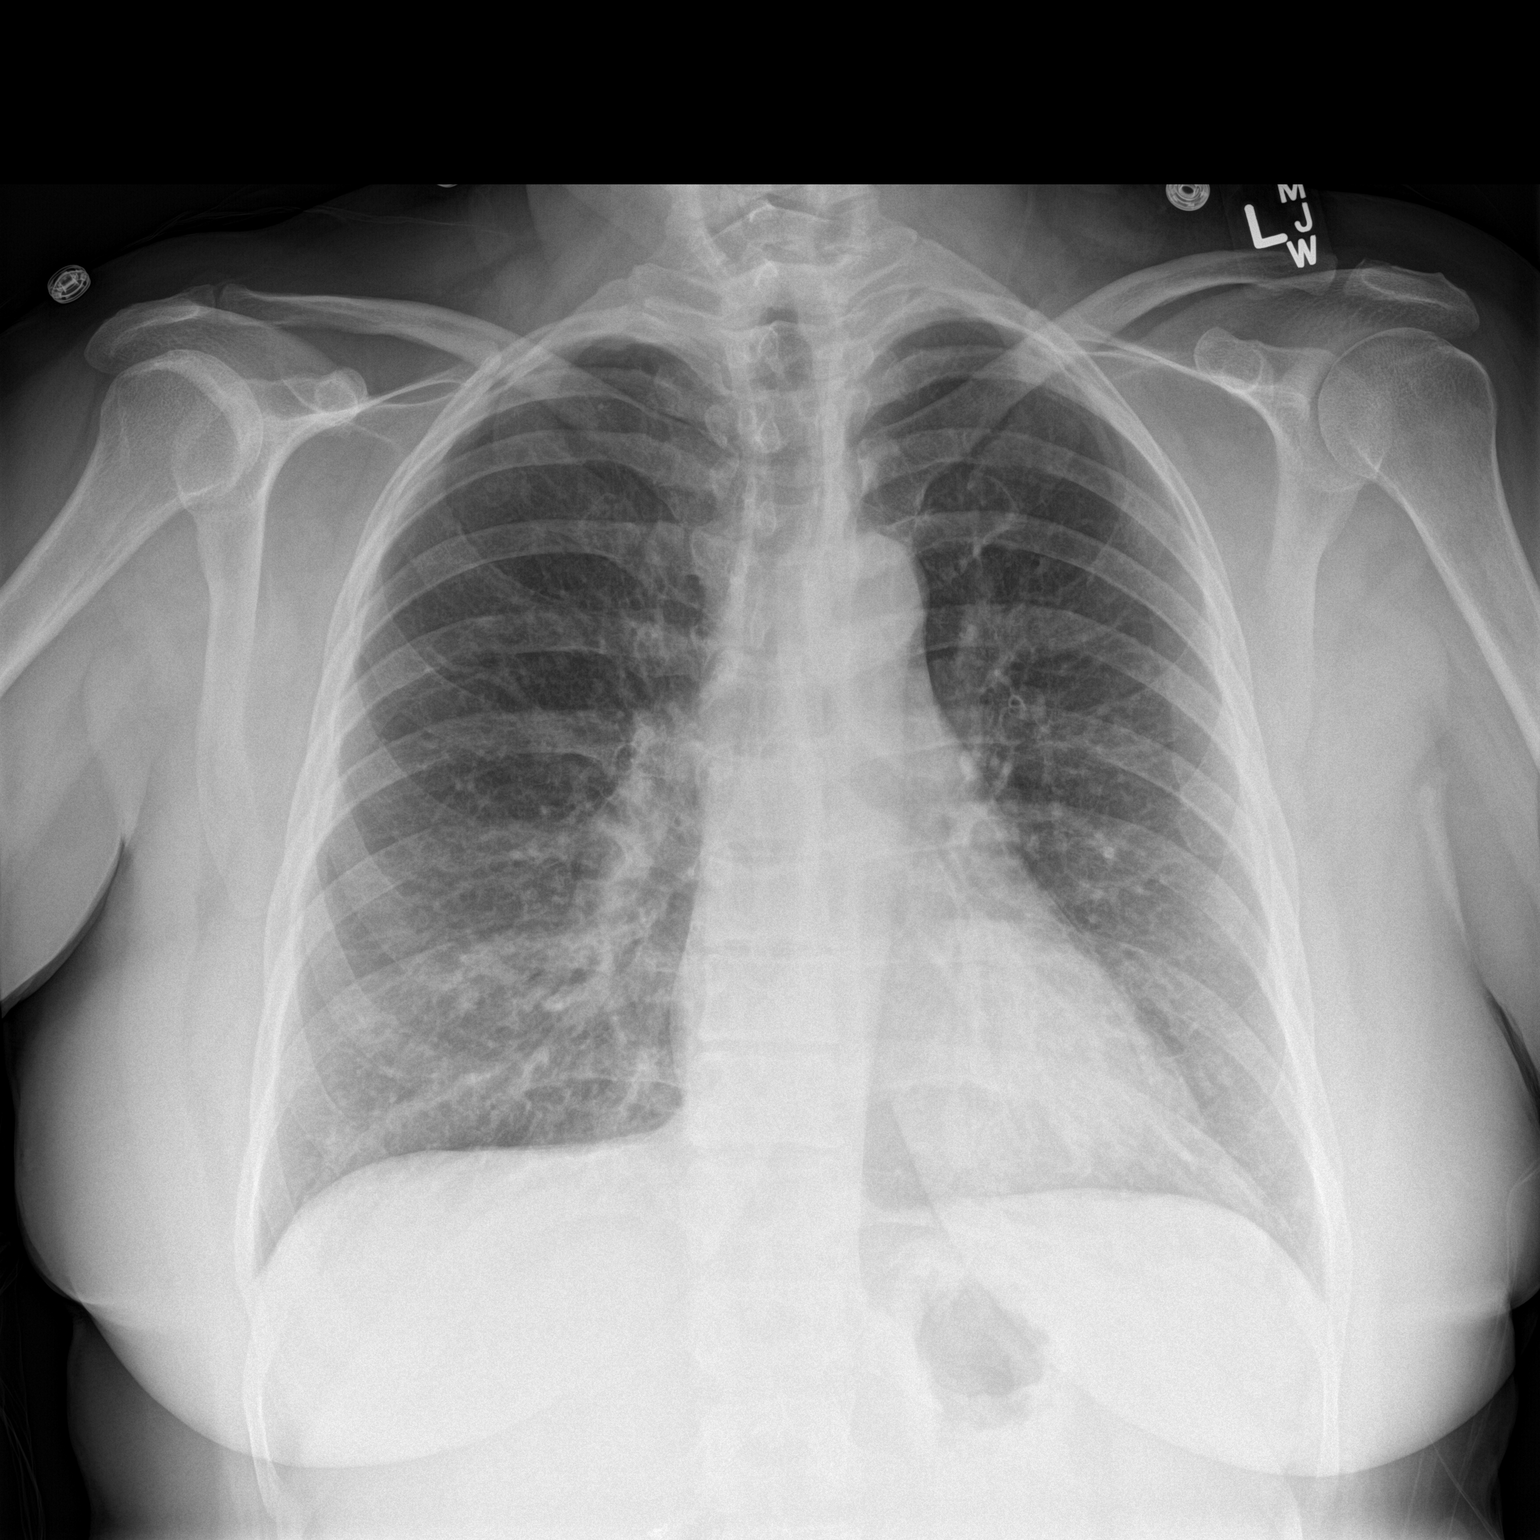

[chest lat]
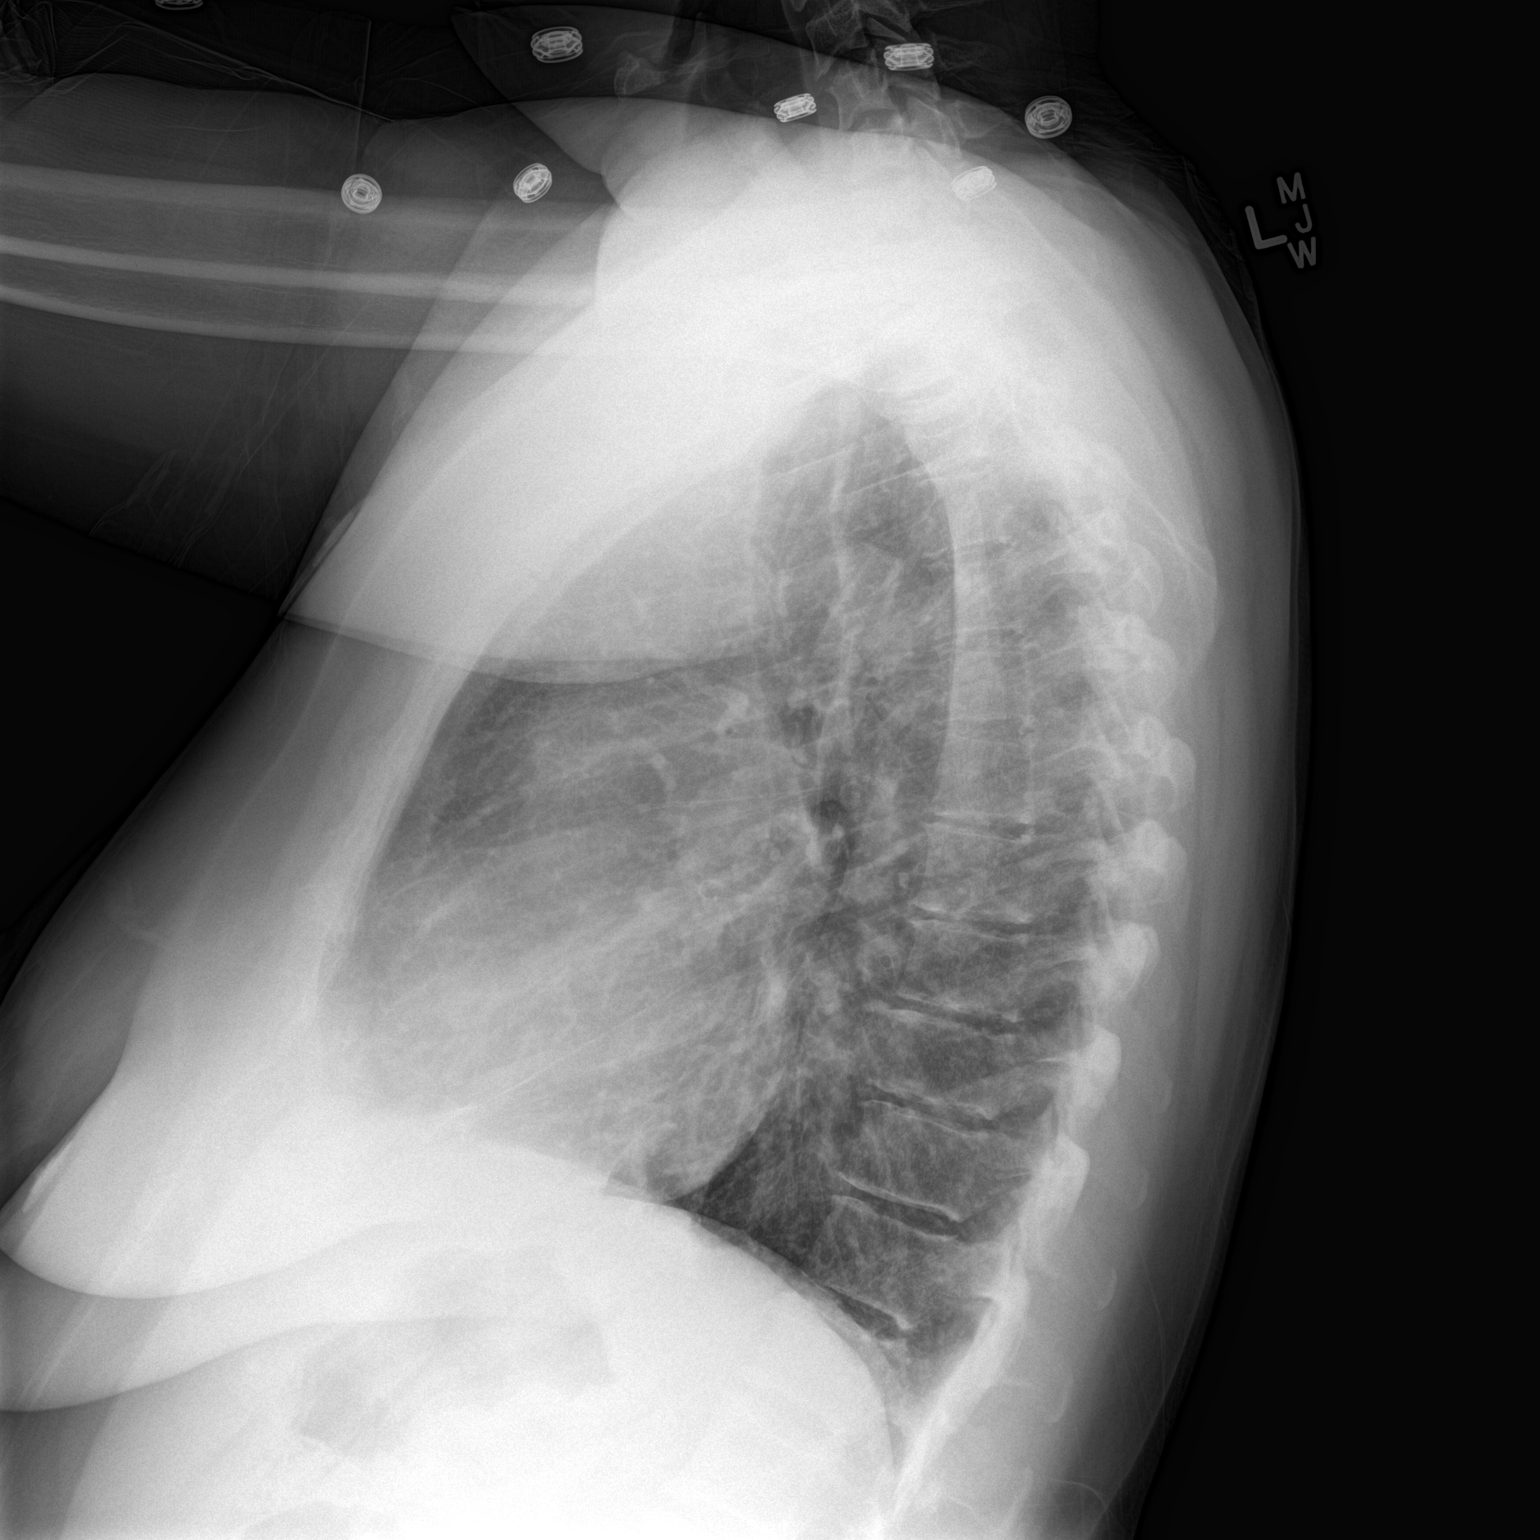

[2 of 2 positions shown; findings below may reference images not displayed]

FINDINGS: The heart size and mediastinal contours are within normal limits.
Both lungs are clear. The visualized skeletal structures are
unremarkable.
IMPRESSION: No active cardiopulmonary disease.

## 2015-12-09 ENCOUNTER — Encounter: Payer: Medicare Other | Attending: Registered Nurse

## 2015-12-09 ENCOUNTER — Ambulatory Visit (HOSPITAL_BASED_OUTPATIENT_CLINIC_OR_DEPARTMENT_OTHER): Payer: Medicare Other | Admitting: Physical Medicine & Rehabilitation

## 2015-12-09 ENCOUNTER — Encounter: Payer: Medicare Other | Admitting: Registered Nurse

## 2015-12-09 ENCOUNTER — Ambulatory Visit: Payer: Medicare Other | Admitting: Registered Nurse

## 2015-12-09 ENCOUNTER — Encounter: Payer: Self-pay | Admitting: Physical Medicine & Rehabilitation

## 2015-12-09 VITALS — BP 113/62 | HR 86 | Resp 14

## 2015-12-09 DIAGNOSIS — J449 Chronic obstructive pulmonary disease, unspecified: Secondary | ICD-10-CM | POA: Diagnosis not present

## 2015-12-09 DIAGNOSIS — M4712 Other spondylosis with myelopathy, cervical region: Secondary | ICD-10-CM | POA: Diagnosis not present

## 2015-12-09 DIAGNOSIS — M797 Fibromyalgia: Secondary | ICD-10-CM | POA: Insufficient documentation

## 2015-12-09 DIAGNOSIS — M542 Cervicalgia: Secondary | ICD-10-CM | POA: Diagnosis not present

## 2015-12-09 DIAGNOSIS — G894 Chronic pain syndrome: Secondary | ICD-10-CM | POA: Insufficient documentation

## 2015-12-09 DIAGNOSIS — Z79899 Other long term (current) drug therapy: Secondary | ICD-10-CM | POA: Insufficient documentation

## 2015-12-09 DIAGNOSIS — Z5181 Encounter for therapeutic drug level monitoring: Secondary | ICD-10-CM | POA: Insufficient documentation

## 2015-12-09 DIAGNOSIS — F17210 Nicotine dependence, cigarettes, uncomplicated: Secondary | ICD-10-CM | POA: Insufficient documentation

## 2015-12-09 DIAGNOSIS — M25512 Pain in left shoulder: Secondary | ICD-10-CM | POA: Insufficient documentation

## 2015-12-09 DIAGNOSIS — M961 Postlaminectomy syndrome, not elsewhere classified: Secondary | ICD-10-CM | POA: Diagnosis not present

## 2015-12-09 MED ORDER — OXYCODONE HCL 5 MG PO TABS: 5.0000 mg | ORAL_TABLET | Freq: Four times a day (QID) | ORAL | Status: DC | PRN

## 2015-12-09 NOTE — Patient Instructions (Signed)
We discussed how smoking reduces fusion rate after neck and back surgery Advised to quit

## 2015-12-09 NOTE — Progress Notes (Signed)
Subjective:    Patient ID: [name], female    DOB: 16-Mar-1966, 50 y.o.   MRN: [id number]  HPI 50 year old female with history of chronic neck pain. She was last seen in our clinic in November 2016. At that time she was seeing her orthopedic spine surgeon but had not decided to have surgery. Because of increasing shoulder pain and neck pain she underwent C5-C6 ACDF by Dr. Otelia Sergeant on 10/03/2015.Patient had no postoperative infections but has been told by her orthopedist that her neck is not fusing as expected. We discussed her cigarette use, one half pack per day. We discussed that this would inhibit bone fusion. She understands. Patient states her psychiatric condition is stable and sees Dr. Reino Kent who is her psychiatrist.  Patient has had minimal improvement in shoulder pain on the left side since surgery. Her neck continues to cause pain.  Pain Inventory Average Pain 8 Pain Right Now 8 My pain is dull and aching  In the last 24 hours, has pain interfered with the following? General activity 7 Relation with others 6 Enjoyment of life 8 What TIME of day is your pain at its worst? all Sleep (in general) Fair  Pain is worse with: walking, sitting and some activites Pain improves with: rest, heat/ice and medication Relief from Meds: 5  Mobility walk without assistance how many minutes can you walk? 15 do you drive?  yes Do you have any goals in this area?  yes  Function I need assistance with the following:  household duties and shopping Do you have any goals in this area?  yes  Neuro/Psych weakness numbness spasms  Prior Studies Any changes since last visit?  yes x-rays CT/MRI nerve study surgery  Physicians involved in your care Any changes since last visit?  no   Family History  Problem Relation Age of Onset  . Price Father     lung  . COPD Father   . Early death Father   . Other Neg Hx   . Hypertension Sister   . Price Maternal Aunt     LUNG  Price   Social History   Social History  . Marital Status: Married    Spouse Name: N/A  . Number of Children: N/A  . Years of Education: N/A   Social History Main Topics  . Smoking status: Current Every Day Smoker -- 1.00 packs/day for 32 years    Types: Cigarettes  . Smokeless tobacco: Never Used     Comment: currently 1/2 ppd  . Alcohol Use: No  . Drug Use: No  . Sexual Activity: Not Currently    Birth Control/ Protection: Surgical   Other Topics Concern  . None   Social History Narrative   Past Surgical History  Procedure Laterality Date  . Appendectomy  1993  . Laparoscopic cholecystectomy  08/2007  . Examination under anesthesia  02/15/2012    Procedure: EXAM UNDER ANESTHESIA;  Surgeon: Maisie Fus A. Cornett, MD;  Location: [facility];  Service: General;  Laterality: Left;  Excision of perineal cyst  . Video bronchoscopy Bilateral 02/15/2014    Procedure: VIDEO BRONCHOSCOPY WITH FLUORO;  Surgeon: Coralyn Helling, MD;  Location: [facility];  Service: Cardiopulmonary;  Laterality: Bilateral;  . Laparoscopic lysis of adhesions  06/2010    Hattie Perch 06/18/2010  . Transobturator sling  09/2008    Transobturator mid urethral sling, cystoscopy, laparoscopy w/LOA/notes 03/04/2011  . Cyst excision perineal Left 01/2012    Hattie Perch 02/15/2012  . Breast biopsy  Left 2008  . Breast lumpectomy Left 2008    "benign"  . Vaginal hysterectomy  1993  . Tubal ligation  1993  . Ovarian cyst removal  X 2-3  . Left oophorectomy Left 08/2013  . Shoulder closed reduction Left 10/03/2015    Procedure: CLOSED MANIPULATION SHOULDER CLOSED  AND INJECTION WITH LOCAL ANESTHETIC;  Surgeon: Kerrin Champagne, MD;  Location: [facility];  Service: Orthopedics;  Laterality: Left;  . Anterior cervical decomp/discectomy fusion  10/03/2015    Procedure: ANTERIOR CERVICAL DISCECTOMY  AND FUSION C5-6 WITH PLATE AND SCREWS, LOCAL AND ALLOGRAFT BONE GRAFT;  Surgeon: Kerrin Champagne, MD;  Location: [facility];  Service:  Orthopedics;;   Past Medical History  Diagnosis Date  . Abscess     rectal  . Depression   . Fibromyalgia   . Ovarian cyst   . Endometriosis   . Pulmonary infiltrates   . COPD (chronic obstructive pulmonary disease) (HCC)   . Emphysema lung (HCC)   . Chronic bronchitis (HCC)     "I get it q yr" (11/14/2014)  . Migraine     "3 in the last 2 wks" (11/14/2014)  . Arthritis     "back; hips" (11/14/2014)  . Chronic back pain     "all over"  . Bipolar disorder (HCC)   . Anxiety   . ILD (interstitial lung disease) (HCC) 11/2013  . Dyspnea     W/ EXERTION   . History of kidney stones    BP 113/62 mmHg  Pulse 86  Resp 14  SpO2 95%  Opioid Risk Score:   Fall Risk Score:  `1  Depression screen PHQ 2/9  Depression screen PHQ 2/9 07/28/2015  Decreased Interest 1  Down, Depressed, Hopeless 1  PHQ - 2 Score 2  Altered sleeping 3  Tired, decreased energy 2  Change in appetite 1  Feeling bad or failure about yourself  0  Trouble concentrating 1  Moving slowly or fidgety/restless 0  Suicidal thoughts 0  PHQ-9 Score 9  Difficult doing work/chores Somewhat difficult     Review of Systems  Constitutional: Positive for diaphoresis.  Hematological: Bruises/bleeds easily.  All other systems reviewed and are negative.      Objective:   Physical Exam  Constitutional: She is oriented to person, place, and time. She appears well-developed and well-nourished.  HENT:  Head: Normocephalic and atraumatic.  Eyes: Conjunctivae and EOM are normal. Pupils are equal, round, and reactive to light.  Neck: Normal range of motion.  Neurological: She is alert and oriented to person, place, and time. She has normal strength. No sensory deficit.  Reflex Scores:      Tricep reflexes are 2+ on the right side and 2+ on the left side.      Bicep reflexes are 2+ on the right side and 2+ on the left side.      Brachioradialis reflexes are 2+ on the right side and 2+ on the left side.      Patellar  reflexes are 2+ on the right side and 2+ on the left side.      Achilles reflexes are 1+ on the right side and 1+ on the left side. Psychiatric: She has a normal mood and affect.  Nursing note and vitals reviewed.   Sensation intact pinprick in bilateral upper limb C5 C6 C7 dermatomal distribution Motion right 5/5 bilateral deltoid, biceps, triceps, grip      Assessment & Plan:  1. Cervical postlaminectomy syndrome-she is only 2  months post C5-C6 fusion. I do not have orthopedic notes but the patient states that she may not be fusing as expected. She states she didn't know that smoking can affect fusion rates. We discussed that at some length and she has decided to quit smoking. She will follow-up with orthopedic surgery. We discussed that we will reinstitute her prior dose of oxycodone 5 mg however given her problems sleeping at night we will increase the number of tablets so that she can take oxycodone 5 mg 1 in the morning and 2 at bedtime. She is in agreement with this plan.  Follow-up one month with nurse practitioner Check urine drug screen

## 2015-12-10 ENCOUNTER — Telehealth: Payer: Self-pay | Admitting: *Deleted

## 2015-12-10 NOTE — Telephone Encounter (Signed)
I will call her and warn her but she has filled both.

## 2015-12-10 NOTE — Telephone Encounter (Signed)
Cancel my prescription please, call patient to inform her that she is not to take prescriptions from both physicians, if she does this again  She will be discharged

## 2015-12-10 NOTE — Telephone Encounter (Signed)
Per [name], [name] told her at checkout she had just gotten percocet from Dr Otelia Sergeant filled and she got the RX from you yesterday. I called and verified with [facility] that she filled Rx percocet from [place] on 12/08/15 and the roxicodone from you on 12/09/15. Please advise

## 2015-12-10 NOTE — Telephone Encounter (Signed)
I spoke with [name] and gave her a verbal warning about receiving and filling narcotic prescriptions  From Dr Otelia Sergeant and Dr Wynn Banker.  She says she was not aware of this. I explained that if it happens again she will be discharged from clinic.  Since she filled both Rx (Nitka #60 5/325 on 12/08/15 and Kirsteins 5 mg oxy #90 on 12/09/15), she should have excess of pills when she returns. I advised her she should follow the directions on Dr Wynn Banker RX q 6 hours. She acknowledges the warning. She returns in 25 days and should have around 50 pills if she takes 4/day.

## 2015-12-31 ENCOUNTER — Telehealth: Payer: Self-pay | Admitting: *Deleted

## 2015-12-31 NOTE — Telephone Encounter (Signed)
Pt called wanting to speak to Sybil regarding the conversation she had a few weeks back regarding her prescriptions, she left no details....asking for Sybil to call back

## 2016-01-01 NOTE — Telephone Encounter (Signed)
[name] called to let us know she has completed the Rx from Dr Otelia Sergeant and he will no longer be prescribing for her.  She was asking if it was ok to start dr Wynn Banker' prescription now.  She wanted to be sure because of our last conversation.  I reassured her that I would document in the chart and it should not be a problem.

## 2016-01-02 ENCOUNTER — Encounter: Payer: Medicare Other | Admitting: Registered Nurse

## 2016-01-08 ENCOUNTER — Encounter: Payer: Medicare Other | Admitting: Registered Nurse

## 2016-01-08 ENCOUNTER — Ambulatory Visit: Payer: Medicare Other | Admitting: Registered Nurse

## 2016-01-12 ENCOUNTER — Encounter: Payer: Medicare Other | Attending: Registered Nurse | Admitting: Registered Nurse

## 2016-01-12 ENCOUNTER — Encounter: Payer: Self-pay | Admitting: Registered Nurse

## 2016-01-12 VITALS — BP 127/73 | HR 67 | Resp 14

## 2016-01-12 DIAGNOSIS — M542 Cervicalgia: Secondary | ICD-10-CM | POA: Insufficient documentation

## 2016-01-12 DIAGNOSIS — Z5181 Encounter for therapeutic drug level monitoring: Secondary | ICD-10-CM

## 2016-01-12 DIAGNOSIS — M797 Fibromyalgia: Secondary | ICD-10-CM | POA: Insufficient documentation

## 2016-01-12 DIAGNOSIS — F17210 Nicotine dependence, cigarettes, uncomplicated: Secondary | ICD-10-CM | POA: Insufficient documentation

## 2016-01-12 DIAGNOSIS — G894 Chronic pain syndrome: Secondary | ICD-10-CM | POA: Diagnosis present

## 2016-01-12 DIAGNOSIS — J449 Chronic obstructive pulmonary disease, unspecified: Secondary | ICD-10-CM | POA: Diagnosis not present

## 2016-01-12 DIAGNOSIS — M25512 Pain in left shoulder: Secondary | ICD-10-CM | POA: Insufficient documentation

## 2016-01-12 DIAGNOSIS — Z79899 Other long term (current) drug therapy: Secondary | ICD-10-CM

## 2016-01-12 MED ORDER — OXYCODONE HCL 5 MG PO TABS: ORAL_TABLET | ORAL | Status: DC

## 2016-01-12 NOTE — Progress Notes (Signed)
Subjective:    Patient ID: [name], female    DOB: 08-Dec-1965, 50 y.o.   MRN: [id number]  HPI: [name] is a 50 year old female who returns for follow up for chronic pain and medication refill. She states her pain is located in her neck and mid to lower back. Also states she was hacing cervical muscle spasms. She rates her pain 8. Her current exercise regime is walking 2 miles a day and performing stretching exercises.   Surgical History: Anterior Cervical Discecotmy and Fusion C5- C-6 with plate and screws, local and allograft bone graft  On 10/03/2015 via Dr. Otelia Sergeant. Dr. Otelia Sergeant prescribed klonopin 0.5 mg 1/2 to one tablet at HS #30 for cervical spasms.  Pain Inventory Average Pain 7 Pain Right Now 8 My pain is constant, dull and aching  In the last 24 hours, has pain interfered with the following? General activity 6 Relation with others 7 Enjoyment of life 6 What TIME of day is your pain at its worst? morning, daytime, evening, night Sleep (in general) Fair  Pain is worse with: bending, standing and some activites Pain improves with: rest, therapy/exercise and medication Relief from Meds: 6  Mobility how many minutes can you walk? 15 min ability to climb steps?  yes do you drive?  yes  Function disabled: date disabled 66 I need assistance with the following:  meal prep, household duties and shopping  Neuro/Psych weakness spasms depression anxiety  Prior Studies x-rays  Physicians involved in your care Primary care . Orthopedist . Psychologist .   Family History  Problem Relation Age of Onset  . Price Father     lung  . COPD Father   . Early death Father   . Other Neg Hx   . Hypertension Sister   . Price Maternal Aunt     LUNG Price   Social History   Social History  . Marital Status: Married    Spouse Name: N/A  . Number of Children: N/A  . Years of Education: N/A   Social History Main Topics  . Smoking status: Current  Every Day Smoker -- 1.00 packs/day for 32 years    Types: Cigarettes  . Smokeless tobacco: Never Used     Comment: currently 1/2 ppd  . Alcohol Use: No  . Drug Use: No  . Sexual Activity: Not Currently    Birth Control/ Protection: Surgical   Other Topics Concern  . None   Social History Narrative   Past Surgical History  Procedure Laterality Date  . Appendectomy  1993  . Laparoscopic cholecystectomy  08/2007  . Examination under anesthesia  02/15/2012    Procedure: EXAM UNDER ANESTHESIA;  Surgeon: Maisie Fus A. Cornett, MD;  Location: [facility];  Service: General;  Laterality: Left;  Excision of perineal cyst  . Video bronchoscopy Bilateral 02/15/2014    Procedure: VIDEO BRONCHOSCOPY WITH FLUORO;  Surgeon: Coralyn Helling, MD;  Location: [facility];  Service: Cardiopulmonary;  Laterality: Bilateral;  . Laparoscopic lysis of adhesions  06/2010    Hattie Perch 06/18/2010  . Transobturator sling  09/2008    Transobturator mid urethral sling, cystoscopy, laparoscopy w/LOA/notes 03/04/2011  . Cyst excision perineal Left 01/2012    Hattie Perch 02/15/2012  . Breast biopsy Left 2008  . Breast lumpectomy Left 2008    "benign"  . Vaginal hysterectomy  1993  . Tubal ligation  1993  . Ovarian cyst removal  X 2-3  . Left oophorectomy Left 08/2013  .  Shoulder closed reduction Left 10/03/2015    Procedure: CLOSED MANIPULATION SHOULDER CLOSED  AND INJECTION WITH LOCAL ANESTHETIC;  Surgeon: Kerrin ChampagneJames E Nitka, MD;  Location: [facility];  Service: Orthopedics;  Laterality: Left;  . Anterior cervical decomp/discectomy fusion  10/03/2015    Procedure: ANTERIOR CERVICAL DISCECTOMY  AND FUSION C5-6 WITH PLATE AND SCREWS, LOCAL AND ALLOGRAFT BONE GRAFT;  Surgeon: Kerrin ChampagneJames E Nitka, MD;  Location: [facility];  Service: Orthopedics;;   Past Medical History  Diagnosis Date  . Abscess     rectal  . Depression   . Fibromyalgia   . Ovarian cyst   . Endometriosis   . Pulmonary infiltrates   . COPD (chronic obstructive  pulmonary disease) (HCC)   . Emphysema lung (HCC)   . Chronic bronchitis (HCC)     "I get it q yr" (11/14/2014)  . Migraine     "3 in the last 2 wks" (11/14/2014)  . Arthritis     "back; hips" (11/14/2014)  . Chronic back pain     "all over"  . Bipolar disorder (HCC)   . Anxiety   . ILD (interstitial lung disease) (HCC) 11/2013  . Dyspnea     W/ EXERTION   . History of kidney stones    BP 127/73 mmHg  Pulse 67  Resp 14  SpO2 92%  Opioid Risk Score:   Fall Risk Score:  `1  Depression screen PHQ 2/9  Depression screen PHQ 2/9 07/28/2015  Decreased Interest 1  Down, Depressed, Hopeless 1  PHQ - 2 Score 2  Altered sleeping 3  Tired, decreased energy 2  Change in appetite 1  Feeling bad or failure about yourself  0  Trouble concentrating 1  Moving slowly or fidgety/restless 0  Suicidal thoughts 0  PHQ-9 Score 9  Difficult doing work/chores Somewhat difficult     Review of Systems  Gastrointestinal: Positive for constipation.  Neurological: Positive for weakness.       Spasms   Hematological: Bruises/bleeds easily.  Psychiatric/Behavioral: Positive for dysphoric mood. The patient is nervous/anxious.   All other systems reviewed and are negative.      Objective:   Physical Exam  Constitutional: She is oriented to person, place, and time. She appears well-developed and well-nourished.  HENT:  Head: Normocephalic and atraumatic.  Neck: Normal range of motion. Neck supple.  Cervical Paraspinal Tenderness: C-5- C-6  Cardiovascular: Normal rate and regular rhythm.   Pulmonary/Chest: Effort normal and breath sounds normal.  Musculoskeletal:  Normal Muscle Bulk and Muscle Testing Reveals: Upper Extremities: Full ROM and Muscle Strength 5/5 Thoracic Paraspinal Tenderness: T-1- T-3 T-7- T-9 Lumbar Paraspinal Tenderness: L-3-L-5 Lower Extremities: Full ROM and Muscle Strength 5/5 Left Lower Extremity Flexion Produces Pain into Lower Extremity Arises from chair with  ease Narrow Based Gait  Neurological: She is alert and oriented to person, place, and time.  Skin: Skin is warm and dry.  Psychiatric: She has a normal mood and affect.  Nursing note and vitals reviewed.         Assessment & Plan:  1.Cervical postlaminectomy syndrome: Status post C5-C6 fusion.Refilled : Oxycodone 5mg  one tablet in the morning  And two tablets in the evening #90. Script post dated  For 01/18/2016.  20 minutes of face to face patient care time was spent during this visit. All questions were encouraged and answered.  F/U in 1 month

## 2016-01-16 ENCOUNTER — Emergency Department (HOSPITAL_COMMUNITY)
Admission: EM | Admit: 2016-01-16 | Discharge: 2016-01-16 | Disposition: A | Discharge status: Home / Self Care | Payer: Medicare Other | Attending: Emergency Medicine | Admitting: Emergency Medicine

## 2016-01-16 ENCOUNTER — Emergency Department (HOSPITAL_COMMUNITY): Payer: Medicare Other

## 2016-01-16 ENCOUNTER — Encounter (HOSPITAL_COMMUNITY): Payer: Self-pay | Admitting: *Deleted

## 2016-01-16 DIAGNOSIS — Y9289 Other specified places as the place of occurrence of the external cause: Secondary | ICD-10-CM | POA: Diagnosis not present

## 2016-01-16 DIAGNOSIS — S61233A Puncture wound without foreign body of left middle finger without damage to nail, initial encounter: Secondary | ICD-10-CM | POA: Insufficient documentation

## 2016-01-16 DIAGNOSIS — M158 Other polyosteoarthritis: Secondary | ICD-10-CM | POA: Diagnosis not present

## 2016-01-16 DIAGNOSIS — S61237A Puncture wound without foreign body of left little finger without damage to nail, initial encounter: Secondary | ICD-10-CM | POA: Diagnosis not present

## 2016-01-16 DIAGNOSIS — W540XXA Bitten by dog, initial encounter: Secondary | ICD-10-CM | POA: Insufficient documentation

## 2016-01-16 DIAGNOSIS — F17210 Nicotine dependence, cigarettes, uncomplicated: Secondary | ICD-10-CM | POA: Insufficient documentation

## 2016-01-16 DIAGNOSIS — Z23 Encounter for immunization: Secondary | ICD-10-CM | POA: Diagnosis not present

## 2016-01-16 DIAGNOSIS — Y9389 Activity, other specified: Secondary | ICD-10-CM | POA: Diagnosis not present

## 2016-01-16 DIAGNOSIS — L089 Local infection of the skin and subcutaneous tissue, unspecified: Secondary | ICD-10-CM

## 2016-01-16 DIAGNOSIS — F419 Anxiety disorder, unspecified: Secondary | ICD-10-CM | POA: Diagnosis not present

## 2016-01-16 DIAGNOSIS — J439 Emphysema, unspecified: Secondary | ICD-10-CM | POA: Insufficient documentation

## 2016-01-16 DIAGNOSIS — S61452A Open bite of left hand, initial encounter: Secondary | ICD-10-CM | POA: Insufficient documentation

## 2016-01-16 DIAGNOSIS — Z79899 Other long term (current) drug therapy: Secondary | ICD-10-CM | POA: Diagnosis not present

## 2016-01-16 DIAGNOSIS — G8929 Other chronic pain: Secondary | ICD-10-CM | POA: Diagnosis not present

## 2016-01-16 DIAGNOSIS — Z8742 Personal history of other diseases of the female genital tract: Secondary | ICD-10-CM | POA: Insufficient documentation

## 2016-01-16 DIAGNOSIS — S61432A Puncture wound without foreign body of left hand, initial encounter: Secondary | ICD-10-CM | POA: Diagnosis not present

## 2016-01-16 DIAGNOSIS — S61235A Puncture wound without foreign body of left ring finger without damage to nail, initial encounter: Secondary | ICD-10-CM | POA: Diagnosis not present

## 2016-01-16 DIAGNOSIS — F319 Bipolar disorder, unspecified: Secondary | ICD-10-CM | POA: Insufficient documentation

## 2016-01-16 DIAGNOSIS — Z87442 Personal history of urinary calculi: Secondary | ICD-10-CM | POA: Insufficient documentation

## 2016-01-16 DIAGNOSIS — Y998 Other external cause status: Secondary | ICD-10-CM | POA: Insufficient documentation

## 2016-01-16 DIAGNOSIS — Z872 Personal history of diseases of the skin and subcutaneous tissue: Secondary | ICD-10-CM | POA: Insufficient documentation

## 2016-01-16 DIAGNOSIS — S61259A Open bite of unspecified finger without damage to nail, initial encounter: Secondary | ICD-10-CM

## 2016-01-16 MED ORDER — OXYCODONE-ACETAMINOPHEN 5-325 MG PO TABS: 1.0000 | ORAL_TABLET | Freq: Three times a day (TID) | ORAL | Status: DC | PRN

## 2016-01-16 MED ORDER — OXYCODONE-ACETAMINOPHEN 5-325 MG PO TABS
1.0000 | ORAL_TABLET | Freq: Once | ORAL | Status: AC
  Administered 2016-01-16: 1 via ORAL

## 2016-01-16 MED ORDER — IBUPROFEN 800 MG PO TABS: 800.0000 mg | ORAL_TABLET | Freq: Three times a day (TID) | ORAL | Status: DC

## 2016-01-16 MED ORDER — OXYCODONE-ACETAMINOPHEN 5-325 MG PO TABS
ORAL_TABLET | ORAL | Status: AC
  Filled 2016-01-16: qty 1

## 2016-01-16 MED ORDER — TETANUS-DIPHTH-ACELL PERTUSSIS 5-2.5-18.5 LF-MCG/0.5 IM SUSP
0.5000 mL | Freq: Once | INTRAMUSCULAR | Status: AC
  Administered 2016-01-16: 0.5 mL via INTRAMUSCULAR
  Filled 2016-01-16: qty 0.5

## 2016-01-16 MED ORDER — BACITRACIN ZINC 500 UNIT/GM EX OINT
1.0000 "application " | TOPICAL_OINTMENT | Freq: Two times a day (BID) | CUTANEOUS | Status: DC
  Administered 2016-01-16: 1 via TOPICAL

## 2016-01-16 MED ORDER — AMOXICILLIN-POT CLAVULANATE 875-125 MG PO TABS: 1.0000 | ORAL_TABLET | Freq: Two times a day (BID) | ORAL | Status: DC

## 2016-01-16 MED ORDER — AMOXICILLIN-POT CLAVULANATE 875-125 MG PO TABS
1.0000 | ORAL_TABLET | Freq: Once | ORAL | Status: AC
  Administered 2016-01-16: 1 via ORAL
  Filled 2016-01-16: qty 1

## 2016-01-16 MED ORDER — TRAMADOL HCL 50 MG PO TABS
50.0000 mg | ORAL_TABLET | Freq: Once | ORAL | Status: AC
  Administered 2016-01-16: 50 mg via ORAL
  Filled 2016-01-16: qty 1

## 2016-01-16 NOTE — ED Notes (Signed)
Patient brought to room; patient undressed, in gown, on continuous pulse oximetry and blood pressure cuff

## 2016-01-16 NOTE — ED Notes (Signed)
Sherita, EMT called pt twice for room placement with no answer.

## 2016-01-16 NOTE — Discharge Instructions (Signed)
Animal Bite  Animal bites can range from mild to serious. An animal bite can result in a scratch on the skin, a deep open cut, a puncture of the skin, a crush injury, or tearing away of the skin or a body part. A small bite from a house pet will usually not cause serious problems. However, some animal bites can become infected or injure a bone or other tissue.   Bites from certain animals can be more dangerous because of the risk of spreading rabies, which is a serious viral infection. This risk is higher with bites from stray animals or wild animals, such as raccoons, foxes, skunks, and bats. Dogs are responsible for most animal bites. Children are bitten more often than adults.  SYMPTOMS   Common symptoms of an animal bite include:   · Pain.    · Bleeding.    · Swelling.    · Bruising.    DIAGNOSIS   This condition may be diagnosed based on a physical exam and medical history. Your health care provider will examine the wound and ask for details about the animal and how the bite happened. You may also have tests, such as:   · Blood tests to check for infection or to determine if surgery is needed.  · X-rays to check for damage to bones or joints.  · Culture test. This uses a sample of fluid from the wound to check for infection.  TREATMENT   Treatment varies depending on the location and type of animal bite and your medical history. Treatment may include:   · Wound care. This often includes cleaning the wound, flushing the wound with saline solution, and applying a bandage (dressing). Sometimes, the wound is left open to heal because of the high risk of infection. However, in some cases, the wound may be closed with stitches (sutures), staples, skin glue, or adhesive strips.    · Antibiotic medicine.    · Tetanus shot.    · Rabies treatment if the animal could have rabies.    In some cases, bites that have become infected may require IV antibiotics and surgical treatment in the hospital.   HOME CARE  INSTRUCTIONS  Wound Care   · Follow instructions from your health care provider about how to take care of your wound. Make sure you:    Wash your hands with soap and water before you change your dressing. If soap and water are not available, use hand sanitizer.    Change your dressing as told by your health care provider.    Leave sutures, skin glue, or adhesive strips in place. These skin closures may need to be in place for 2 weeks or longer. If adhesive strip edges start to loosen and curl up, you may trim the loose edges. Do not remove adhesive strips completely unless your health care provider tells you to do that.  · Check your wound every day for signs of infection. Watch for:       Increasing redness, swelling, or pain.       Fluid, blood, or pus.    General Instructions   · Take or apply over-the-counter and prescription medicines only as told by your health care provider.    · If you were prescribed an antibiotic, take or apply it as told by your health care provider. Do not stop using the antibiotic even if your condition improves.    · Keep the injured area raised (elevated) above the level of your heart while you are sitting or lying down, if this is possible.    · If directed, apply   ice to the injured area.       Put ice in a plastic bag.       Place a towel between your skin and the bag.       Leave the ice on for 20 minutes, 2-3 times per day.    · Keep all follow-up visits as told by your health care provider. This is important.    SEEK MEDICAL CARE IF:  · You have increasing redness, swelling, or pain at the site of your wound.    · You have a general feeling of sickness (malaise).    · You feel nauseous or you vomit.    · You have pain that does not get better.    SEEK IMMEDIATE MEDICAL CARE IF:  · You have a red streak extending away from your wound.    · You have fluid, blood, or pus coming from your wound.    · You have a fever or chills.    · You have trouble moving your injured area.    · You  have numbness or tingling extending beyond the wound.     This information is not intended to replace advice given to you by your health care provider. Make sure you discuss any questions you have with your health care provider.     Document Released: 07/06/2011 Document Revised: 07/09/2015 Document Reviewed: 03/05/2015  Elsevier Interactive Patient Education ©2016 [employer]      Cellulitis  Cellulitis is an infection of the skin and the tissue beneath it. The infected area is usually red and tender. Cellulitis occurs most often in the arms and lower legs.   CAUSES   Cellulitis is caused by bacteria that enter the skin through cracks or cuts in the skin. The most common types of bacteria that cause cellulitis are staphylococci and streptococci.  SIGNS AND SYMPTOMS   · Redness and warmth.  · Swelling.  · Tenderness or pain.  · Fever.  DIAGNOSIS   Your health care provider can usually determine what is wrong based on a physical exam. Blood tests may also be done.  TREATMENT   Treatment usually involves taking an antibiotic medicine.  HOME CARE INSTRUCTIONS   · Take your antibiotic medicine as directed by your health care provider. Finish the antibiotic even if you start to feel better.  · Keep the infected arm or leg elevated to reduce swelling.  · Apply a warm cloth to the affected area up to 4 times per day to relieve pain.  · Take medicines only as directed by your health care provider.  · Keep all follow-up visits as directed by your health care provider.  SEEK MEDICAL CARE IF:   · You notice red streaks coming from the infected area.  · Your red area gets larger or turns dark in color.  · Your bone or joint underneath the infected area becomes painful after the skin has healed.  · Your infection returns in the same area or another area.  · You notice a swollen bump in the infected area.  · You develop new symptoms.  · You have a fever.  SEEK IMMEDIATE MEDICAL CARE IF:   · You feel very sleepy.  · You develop  vomiting or diarrhea.  · You have a general ill feeling (malaise) with muscle aches and pains.     This information is not intended to replace advice given to you by your health care provider. Make sure you discuss any questions you have with your health care provider.     Document Released: 07/28/2005 Document Revised:   07/09/2015 Document Reviewed: 01/03/2012  Elsevier Interactive Patient Education ©2016 [employer]

## 2016-01-16 NOTE — ED Notes (Signed)
L hand wound cleaned, covered with antibiotic ointment and dressing. Wound care instructions discussed. Pt has no further questions.

## 2016-01-16 NOTE — ED Notes (Signed)
Pt reports breaking up a dog fight yesterday and has bite marks to left hand. Redness and swelling noted. Reports dog was up to date on shots.

## 2016-01-16 NOTE — ED Notes (Signed)
Pt reports she was attempting to break up fight between her dogs yesterday. States dog bites and scratches on L hand. Reports increased swelling and redness today. Able to wiggle digits. Capillary refill less than 3 seconds. Dogs up to date on vaccines. No other injuries noted.

## 2016-01-16 NOTE — ED Notes (Signed)
Patient's lacerations are already closed over; patient states she has been cleaning her hand with peroxide and applying neosporin at home

## 2016-01-16 NOTE — ED Provider Notes (Signed)
CSN: [id number]     Arrival date & time 01/16/16  4098 History   First MD Initiated Contact with Patient 01/16/16 1013     Chief Complaint  Patient presents with  . Animal Bite     (Consider location/radiation/quality/duration/timing/severity/associated sxs/prior Treatment) HPI   Pt is a 50 y/o female with hx of COPD, bipolar, anxiety/depression, who presents to the ER for evaluation of Dog bite to the left hand which occurred yesterday.  She was attended regular 5 between dogs when a pimple who is known to her, scratched and punctured her left hand across her fingers and palm. She cleaned her wounds with hydrogen peroxide and applied Neosporin, however she experienced swelling and redness with increased pain overnight and presents to the emergency room concerned with infection. She rates her pain 8 out of 10, not improved with other the counter medication.  Sh e denies fever, chills, sweats, nausea, vomiting. She is able to move all of her fingers and her hand and her wrist without any difficulty. She denies numbness or tingling. She denies any purulent drainage.  She denies any history of diabetes, wound healing complication, chronic steroid use or immunocompromise state.  She does not know her last tetanus.  She states that she has verified that the dog was up-to-date on his rabies vaccine.    Past Medical History  Diagnosis Date  . Abscess     rectal  . Depression   . Fibromyalgia   . Ovarian cyst   . Endometriosis   . Pulmonary infiltrates   . COPD (chronic obstructive pulmonary disease) (HCC)   . Emphysema lung (HCC)   . Chronic bronchitis (HCC)     "I get it q yr" (11/14/2014)  . Migraine     "3 in the last 2 wks" (11/14/2014)  . Arthritis     "back; hips" (11/14/2014)  . Chronic back pain     "all over"  . Bipolar disorder (HCC)   . Anxiety   . ILD (interstitial lung disease) (HCC) 11/2013  . Dyspnea     W/ EXERTION   . History of kidney stones    Past Surgical History    Procedure Laterality Date  . Appendectomy  1993  . Laparoscopic cholecystectomy  08/2007  . Examination under anesthesia  02/15/2012    Procedure: EXAM UNDER ANESTHESIA;  Surgeon: Maisie Fus A. Cornett, MD;  Location: [facility];  Service: General;  Laterality: Left;  Excision of perineal cyst  . Video bronchoscopy Bilateral 02/15/2014    Procedure: VIDEO BRONCHOSCOPY WITH FLUORO;  Surgeon: Coralyn Helling, MD;  Location: [facility];  Service: Cardiopulmonary;  Laterality: Bilateral;  . Laparoscopic lysis of adhesions  06/2010    Hattie Perch 06/18/2010  . Transobturator sling  09/2008    Transobturator mid urethral sling, cystoscopy, laparoscopy w/LOA/notes 03/04/2011  . Cyst excision perineal Left 01/2012    Hattie Perch 02/15/2012  . Breast biopsy Left 2008  . Breast lumpectomy Left 2008    "benign"  . Vaginal hysterectomy  1993  . Tubal ligation  1993  . Ovarian cyst removal  X 2-3  . Left oophorectomy Left 08/2013  . Shoulder closed reduction Left 10/03/2015    Procedure: CLOSED MANIPULATION SHOULDER CLOSED  AND INJECTION WITH LOCAL ANESTHETIC;  Surgeon: Kerrin Champagne, MD;  Location: [facility];  Service: Orthopedics;  Laterality: Left;  . Anterior cervical decomp/discectomy fusion  10/03/2015    Procedure: ANTERIOR CERVICAL DISCECTOMY  AND FUSION C5-6 WITH PLATE AND SCREWS, LOCAL AND ALLOGRAFT  BONE GRAFT;  Surgeon: Kerrin Champagne, MD;  Location: [facility] OR;  Service: Orthopedics;;   Family History  Problem Relation Age of Onset  . Cancer Father     lung  . COPD Father   . Early death Father   . Other Neg Hx   . Hypertension Sister   . Cancer Maternal Aunt     LUNG CANCER   Social History  Substance Use Topics  . Smoking status: Current Every Day Smoker -- 1.00 packs/day for 32 years    Types: Cigarettes  . Smokeless tobacco: Never Used     Comment: currently 1/2 ppd  . Alcohol Use: No   OB History    Gravida Para Term Preterm AB TAB SAB Ectopic Multiple Living   0 0 0 0 0 0 2      Review of Systems  All other systems reviewed and are negative.     Allergies  Cortisone acetate and Vicodin  Home Medications   Prior to Admission medications   Medication Sig Start Date End Date Taking? Authorizing Provider  albuterol (PROVENTIL HFA;VENTOLIN HFA) 108 (90 BASE) MCG/ACT inhaler Inhale 2 puffs into the lungs every 6 (six) hours as needed for wheezing or shortness of breath. 11/04/14  Yes Coralyn Helling, MD  ALPRAZolam Prudy Feeler) 1 MG tablet Take 1 mg by mouth 4 (four) times daily.    Yes Historical Provider, MD  amphetamine-dextroamphetamine (ADDERALL XR) 25 MG 24 hr capsule Take 1 capsule by mouth 2 (two) times daily.  07/17/15  Yes Historical Provider, MD  clonazePAM (KLONOPIN) 0.5 MG tablet Take 0.5 mg by mouth at bedtime.    Yes Historical Provider, MD  FLUoxetine (PROZAC) 40 MG capsule Take 40 mg by mouth 2 (two) times daily.    Yes Historical Provider, MD  lamoTRIgine (LAMICTAL) 200 MG tablet Take 200 mg by mouth 2 (two) times daily.  02/17/14  Yes Historical Provider, MD  LATUDA 20 MG TABS tablet Take 20 mg by mouth every evening. 12/31/15  Yes Historical Provider, MD  LYRICA 200 MG capsule Take 1 capsule by mouth 2 (two) times daily. 07/23/15  Yes Historical Provider, MD  ondansetron (ZOFRAN) 8 MG tablet Take 1 tablet (8 mg total) by mouth every 8 (eight) hours as needed for nausea or vomiting. 10/04/15  Yes Kerrin Champagne, MD  oxyCODONE (OXY IR/ROXICODONE) 5 MG immediate release tablet One tablet in the morning  and Two tablets in the Evening 01/12/16  Yes Jones Bales, NP  promethazine (PHENERGAN) 12.5 MG tablet Take 1 tablet by mouth every 8 (eight) hours as needed. Nausea 12/25/15  Yes Historical Provider, MD  QUEtiapine (SEROQUEL) 25 MG tablet Take 25 mg by mouth 3 (three) times daily. 12/04/15  Yes Historical Provider, MD  rizatriptan (MAXALT) 10 MG tablet Take 20 mg by mouth daily as needed for migraine. May repeat in 2 hours if needed   Yes Historical Provider, MD   zolpidem (AMBIEN) 10 MG tablet Take 10 mg by mouth at bedtime. May take 1-1 tablets at hs prn 02/25/14  Yes Historical Provider, MD  amoxicillin-clavulanate (AUGMENTIN) 875-125 MG tablet Take 1 tablet by mouth every 12 (twelve) hours. 01/16/16   Danelle Berry, PA-C  ibuprofen (ADVIL,MOTRIN) 800 MG tablet Take 1 tablet (800 mg total) by mouth 3 (three) times daily. 01/16/16   Danelle Berry, PA-C  oxyCODONE-acetaminophen (PERCOCET/ROXICET) 5-325 MG tablet Take 1 tablet by mouth every 8 (eight) hours as needed for severe pain. 01/16/16  Danelle BerryLeisa Merrilyn Legler, PA-C   BP 107/76 mmHg  Pulse 54  Temp(Src) 98.4 F (36.9 C) (Oral)  Resp 18  Ht 5\' 3"  (1.6 m)  Wt 79.379 kg  BMI 31.01 kg/m2  SpO2 100% Physical Exam  Constitutional: She is oriented to person, place, and time. She appears well-developed and well-nourished. No distress.  HENT:  Head: Normocephalic and atraumatic.  Right Ear: External ear normal.  Left Ear: External ear normal.  Nose: Nose normal.  Mouth/Throat: Oropharynx is clear and moist. No oropharyngeal exudate.  Eyes: Conjunctivae and EOM are normal. Pupils are equal, round, and reactive to light. Right eye exhibits no discharge. Left eye exhibits no discharge. No scleral icterus.  Neck: Normal range of motion. Neck supple. No JVD present. No tracheal deviation present.  Cardiovascular: Normal rate and regular rhythm.   Pulmonary/Chest: Effort normal and breath sounds normal. No stridor. No respiratory distress.  Musculoskeletal: Normal range of motion. She exhibits edema and tenderness.       Left wrist: Normal.       Left hand: She exhibits tenderness, laceration and swelling. She exhibits normal range of motion, no bony tenderness, normal two-point discrimination, normal capillary refill and no deformity. Normal sensation noted. Normal strength noted.  Multiple small (5-4410mm) superficial lacerations and puncture wounds across 2-5 MCP.  Mild edema with erythema from distal dorsum of hand to  proximal fingers.  Minimally ttp.  Normal ROM, strength, sensation, brick capillary refill.  Lymphadenopathy:    She has no cervical adenopathy.  Neurological: She is alert and oriented to person, place, and time. She exhibits normal muscle tone. Coordination normal.  Skin: Skin is warm and dry. No rash noted. She is not diaphoretic. No erythema. No pallor.  Psychiatric: She has a normal mood and affect. Her behavior is normal. Judgment and thought content normal.  Nursing note and vitals reviewed.         ED Course  Procedures (including critical care time) Labs Review Labs Reviewed - No data to display  Imaging Review Dg Hand Complete Left  01/16/2016  CLINICAL DATA:  Dog bite EXAM: LEFT HAND - COMPLETE 3+ VIEW COMPARISON:  None. FINDINGS: No acute fracture. No dislocation. Soft tissue swelling over the dorsum of the hand is noted. IMPRESSION: No acute bony pathology Electronically Signed   By: Jolaine ClickArthur  Hoss M.D.   On: 01/16/2016 11:28   I have personally reviewed and evaluated these images and lab results as part of my medical decision-making.   EKG Interpretation None      MDM   Multiple small lacerations and abrasions to left hand, occurred last night, minimal swelling and erythema.  Pt had tetanus updated.  Xray negative.  Wounds irrigated, explored.  Abx ointment and dressing applied.  Pt has normal ROM of left hand and all fingers.  No fever, chills, sweats, N, V, VSS.  Pt's tetanus was updated, she received her first dose of augmentin, instructed to have wound rechecked in 2 days.  Return precautions reviewed.  Pt d/c home in good condition.  Final diagnoses:  Dog bite of left hand including fingers with infection, initial encounter    Danelle BerryLeisa Marcos Peloso, PA-C 01/16/16 1204  Richardean Canalavid H Yao, MD 01/16/16 [phone]

## 2016-01-17 LAB — TOXASSURE SELECT,+ANTIDEPR,UR: PDF: 0

## 2016-01-19 ENCOUNTER — Telehealth: Payer: Self-pay

## 2016-01-19 NOTE — Telephone Encounter (Signed)
Pt wanted to know about UDS results. She was also attacked by a pitt bull over the weekend. Was rx'ed ibuprofen 800mg  at the hospital. Would like to know if she can take it. ED also gave her a script of percocet q 8 hrs.

## 2016-01-19 NOTE — Telephone Encounter (Signed)
Notified [name] that her UDS was positive for cocaine.  We are going to discharge her from the clinic and I have offered her the [facility] and she agrees that this is what she needs.  I will include the pamphlet in her letter with surrounding pain clinics as well.

## 2016-01-19 NOTE — Progress Notes (Signed)
Urine drug screen for this encounter is inconsistent.  Positive for Cocaine.  Discharge from clinic per Dr Wynn BankerKirsteins.

## 2016-02-09 ENCOUNTER — Ambulatory Visit: Payer: Medicare Other | Admitting: Registered Nurse

## 2016-03-17 ENCOUNTER — Other Ambulatory Visit (HOSPITAL_COMMUNITY): Payer: Self-pay | Admitting: Orthopaedic Surgery

## 2016-03-17 DIAGNOSIS — M79672 Pain in left foot: Secondary | ICD-10-CM

## 2016-03-26 ENCOUNTER — Ambulatory Visit (HOSPITAL_COMMUNITY)
Admission: RE | Admit: 2016-03-26 | Discharge: 2016-03-26 | Disposition: A | Discharge status: Home / Self Care | Payer: Medicare Other | Source: Ambulatory Visit | Attending: Orthopaedic Surgery | Admitting: Orthopaedic Surgery

## 2016-03-26 DIAGNOSIS — M25475 Effusion, left foot: Secondary | ICD-10-CM | POA: Diagnosis not present

## 2016-03-26 DIAGNOSIS — R937 Abnormal findings on diagnostic imaging of other parts of musculoskeletal system: Secondary | ICD-10-CM | POA: Diagnosis not present

## 2016-03-26 DIAGNOSIS — M79672 Pain in left foot: Secondary | ICD-10-CM | POA: Diagnosis present

## 2016-05-04 DIAGNOSIS — Z79899 Other long term (current) drug therapy: Secondary | ICD-10-CM | POA: Diagnosis not present

## 2016-05-04 DIAGNOSIS — E559 Vitamin D deficiency, unspecified: Secondary | ICD-10-CM | POA: Diagnosis not present

## 2016-05-04 DIAGNOSIS — M5442 Lumbago with sciatica, left side: Secondary | ICD-10-CM | POA: Diagnosis not present

## 2016-05-04 DIAGNOSIS — G8929 Other chronic pain: Secondary | ICD-10-CM | POA: Diagnosis not present

## 2016-05-04 DIAGNOSIS — M5441 Lumbago with sciatica, right side: Secondary | ICD-10-CM | POA: Diagnosis not present

## 2016-05-20 ENCOUNTER — Emergency Department (HOSPITAL_COMMUNITY): Payer: Medicare Other

## 2016-05-20 ENCOUNTER — Encounter (HOSPITAL_COMMUNITY): Payer: Self-pay | Admitting: Emergency Medicine

## 2016-05-20 ENCOUNTER — Emergency Department (HOSPITAL_COMMUNITY)
Admission: EM | Admit: 2016-05-20 | Discharge: 2016-05-20 | Disposition: A | Discharge status: Home / Self Care | Payer: Medicare Other | Attending: Emergency Medicine | Admitting: Emergency Medicine

## 2016-05-20 DIAGNOSIS — J449 Chronic obstructive pulmonary disease, unspecified: Secondary | ICD-10-CM | POA: Insufficient documentation

## 2016-05-20 DIAGNOSIS — F17210 Nicotine dependence, cigarettes, uncomplicated: Secondary | ICD-10-CM | POA: Diagnosis not present

## 2016-05-20 DIAGNOSIS — Y939 Activity, unspecified: Secondary | ICD-10-CM | POA: Diagnosis not present

## 2016-05-20 DIAGNOSIS — S93601A Unspecified sprain of right foot, initial encounter: Secondary | ICD-10-CM | POA: Diagnosis not present

## 2016-05-20 DIAGNOSIS — Y929 Unspecified place or not applicable: Secondary | ICD-10-CM | POA: Insufficient documentation

## 2016-05-20 DIAGNOSIS — Y999 Unspecified external cause status: Secondary | ICD-10-CM | POA: Insufficient documentation

## 2016-05-20 DIAGNOSIS — W109XXA Fall (on) (from) unspecified stairs and steps, initial encounter: Secondary | ICD-10-CM | POA: Diagnosis not present

## 2016-05-20 DIAGNOSIS — F329 Major depressive disorder, single episode, unspecified: Secondary | ICD-10-CM | POA: Diagnosis not present

## 2016-05-20 DIAGNOSIS — S99921A Unspecified injury of right foot, initial encounter: Secondary | ICD-10-CM | POA: Diagnosis present

## 2016-05-20 DIAGNOSIS — M79671 Pain in right foot: Secondary | ICD-10-CM | POA: Diagnosis not present

## 2016-05-20 NOTE — ED Notes (Signed)
Pt departed in NAD, refused use of wheelchair.  

## 2016-05-20 NOTE — ED Provider Notes (Signed)
CSN: [id number]     Arrival date & time 05/20/16  1626 History  By signing my name below, I, [name], attest that this documentation has been prepared under the direction and in the presence of Julie RubbermaidJeffrey Allyne Hebert, PA-C. Electronically Signed: Phillis HaggisGabriella Price, ED Scribe. 05/20/2016. 8:03 PM.   Chief Complaint  Patient presents with  . Foot Pain   The history is provided by the patient. No language interpreter was used.  HPI Comments: [name] is a 50 y.o. Female with a hx of COPD, emphysema, and arthritis who presents to the Emergency Department complaining of  persistent right foot pain s/p a fall onset one month ago. She states that she fell down stairs initially and landed on the foot. She reports pain immediately after the fall with mild swelling. She states [place] [facility] [place] and had an x-ray performed that showed a contusion without fracture. She reports that the pain continues and has noticed bruising to the lateral portion of the foot with intermittent swelling. There is worsening pain with palpation to the area and with weightbearing. She has been taking Tylenol and Ibuprofen for pain at home. She denies wound, numbness, or weakness.   Past Medical History  Diagnosis Date  . Abscess     rectal  . Depression   . Fibromyalgia   . Ovarian cyst   . Endometriosis   . Pulmonary infiltrates   . COPD (chronic obstructive pulmonary disease) (HCC)   . Emphysema lung (HCC)   . Chronic bronchitis (HCC)     "I get it q yr" (11/14/2014)  . Migraine     "3 in the last 2 wks" (11/14/2014)  . Arthritis     "back; hips" (11/14/2014)  . Chronic back pain     "all over"  . Bipolar disorder (HCC)   . Anxiety   . ILD (interstitial lung disease) (HCC) 11/2013  . Dyspnea     W/ EXERTION   . History of kidney stones    Past Surgical History  Procedure Laterality Date  . Appendectomy  1993  . Laparoscopic cholecystectomy  08/2007  . Examination under anesthesia   02/15/2012    Procedure: EXAM UNDER ANESTHESIA;  Surgeon: Julie Fushomas A. Cornett, MD;  Location: [facility];  Service: General;  Laterality: Left;  Excision of perineal cyst  . Video bronchoscopy Bilateral 02/15/2014    Procedure: VIDEO BRONCHOSCOPY WITH FLUORO;  Surgeon: Julie HellingVineet Sood, MD;  Location: [facility];  Service: Cardiopulmonary;  Laterality: Bilateral;  . Laparoscopic lysis of adhesions  06/2010    [name]/notes 06/18/2010  . Transobturator sling  09/2008    Transobturator mid urethral sling, cystoscopy, laparoscopy w/LOA/notes 03/04/2011  . Cyst excision perineal Left 01/2012    [name]/notes 02/15/2012  . Breast biopsy Left 2008  . Breast lumpectomy Left 2008    "benign"  . Vaginal hysterectomy  1993  . Tubal ligation  1993  . Ovarian cyst removal  X 2-3  . Left oophorectomy Left 08/2013  . Shoulder closed reduction Left 10/03/2015    Procedure: CLOSED MANIPULATION SHOULDER CLOSED  AND INJECTION WITH LOCAL ANESTHETIC;  Surgeon: Julie ChampagneJames E Nitka, MD;  Location: [facility] OR;  Service: Orthopedics;  Laterality: Left;  . Anterior cervical decomp/discectomy fusion  10/03/2015    Procedure: ANTERIOR CERVICAL DISCECTOMY  AND FUSION C5-6 WITH PLATE AND SCREWS, LOCAL AND ALLOGRAFT BONE GRAFT;  Surgeon: Julie ChampagneJames E Nitka, MD;  Location: [facility] OR;  Service: Orthopedics;;   Family History  Problem Relation Age of Onset  . Cancer Father     lung  . COPD Father   . Early death Father   . Other Neg Hx   . Hypertension Sister   . Cancer Maternal Aunt     LUNG CANCER   Social History  Substance Use Topics  . Smoking status: Current Every Day Smoker -- 1.00 packs/day for 32 years    Types: Cigarettes  . Smokeless tobacco: Never Used     Comment: currently 1/2 ppd  . Alcohol Use: No   OB History    Gravida Para Term Preterm AB TAB SAB Ectopic Multiple Living   0 0 0 0 0 0 2     Review of Systems  All other systems reviewed and are negative.  Allergies  Cortisone acetate and Vicodin  Home  Medications   Prior to Admission medications   Medication Sig Start Date End Date Taking? Authorizing Provider  albuterol (PROVENTIL HFA;VENTOLIN HFA) 108 (90 BASE) MCG/ACT inhaler Inhale 2 puffs into the lungs every 6 (six) hours as needed for wheezing or shortness of breath. 11/04/14   Julie Helling, MD  ALPRAZolam Prudy Feeler) 1 MG tablet Take 1 mg by mouth 4 (four) times daily.     Historical Provider, MD  amoxicillin-clavulanate (AUGMENTIN) 875-125 MG tablet Take 1 tablet by mouth every 12 (twelve) hours. 01/16/16   Julie Berry, PA-C  amphetamine-dextroamphetamine (ADDERALL XR) 25 MG 24 hr capsule Take 1 capsule by mouth 2 (two) times daily.  07/17/15   Historical Provider, MD  clonazePAM (KLONOPIN) 0.5 MG tablet Take 0.5 mg by mouth at bedtime.     Historical Provider, MD  FLUoxetine (PROZAC) 40 MG capsule Take 40 mg by mouth 2 (two) times daily.     Historical Provider, MD  ibuprofen (ADVIL,MOTRIN) 800 MG tablet Take 1 tablet (800 mg total) by mouth 3 (three) times daily. 01/16/16   Julie Berry, PA-C  lamoTRIgine (LAMICTAL) 200 MG tablet Take 200 mg by mouth 2 (two) times daily.  02/17/14   Historical Provider, MD  LATUDA 20 MG TABS tablet Take 20 mg by mouth every Price. 12/31/15   Historical Provider, MD  LYRICA 200 MG capsule Take 1 capsule by mouth 2 (two) times daily. 07/23/15   Historical Provider, MD  ondansetron (ZOFRAN) 8 MG tablet Take 1 tablet (8 mg total) by mouth every 8 (eight) hours as needed for nausea or vomiting. 10/04/15   Julie Champagne, MD  [name] 01/12/16   Julie Bales, NP  Julie-acetaminophen (PERCOCET/ROXICET) 5-325 MG tablet Take 1 tablet by mouth every 8 (eight) hours as needed for severe pain. 01/16/16   Julie Berry, PA-C  promethazine (PHENERGAN) 12.5 MG tablet Take 1 tablet by mouth every 8 (eight) hours as needed. Nausea 12/25/15   Historical Provider, MD   QUEtiapine (SEROQUEL) 25 MG tablet Take 25 mg by mouth 3 (three) times daily. 12/04/15   Historical Provider, MD  rizatriptan (MAXALT) 10 MG tablet Take 20 mg by mouth daily as needed for migraine. May repeat in 2 hours if needed    Historical Provider, MD  zolpidem (AMBIEN) 10 MG tablet Take 10 mg by mouth at bedtime. May take 1-1 tablets at hs prn 02/25/14   Historical Provider, MD   BP 112/74 mmHg  Pulse 67  Temp(Src) 97.8 F (36.6 C) (Oral)  Resp 18  SpO2  96%   Physical Exam  Constitutional: She is oriented to person, place, and time. She appears well-developed and well-nourished.  HENT:  Head: Normocephalic and atraumatic.  Mouth/Throat: Oropharynx is clear and moist.  Eyes: Conjunctivae and EOM are normal. Pupils are equal, round, and reactive to light.  Neck: Normal range of motion. Neck supple.  Musculoskeletal: Normal range of motion.  TTP of right lateral ankle and foot, minor swelling noted. Normal ROM of ankle  Neurological: She is alert and oriented to person, place, and time.  Skin: Skin is warm and dry.  Psychiatric: She has a normal mood and affect. Her behavior is normal.  Nursing note and vitals reviewed.   ED Course  Procedures (including critical care time) DIAGNOSTIC STUDIES: Oxygen Saturation is 96% on RA, normal by my interpretation.    COORDINATION OF CARE: 8:03 PM-Discussed treatment plan which includes x-ray with pt at bedside and pt agreed to plan.    Labs Review Labs Reviewed - No data to display  Imaging Review Dg Foot Complete Right  05/20/2016  CLINICAL DATA:  Larey Seat downstairs in June.  Persistent foot pain. EXAM: RIGHT FOOT COMPLETE - 3+ VIEW COMPARISON:  09/24/2008 FINDINGS: The joint spaces are maintained. No acute fractures identified. There is a lesion in the distal phalanx of the fourth toe which was present in 2009. It could be a simple unicameral bone cyst but is more likely an enchondroma. IMPRESSION: No acute fracture is identified. Lytic  lesion in the distal phalanx of the fourth toe is most likely an enchondroma. I do not think this has significantly changed since 2009. Electronically Signed   By: Rudie Meyer M.D.   On: 05/20/2016 17:52   I have personally reviewed and evaluated these images and lab results as part of my medical decision-making.   EKG Interpretation None      MDM   Final diagnoses:  Foot sprain, right, initial encounter   Labs:  Imaging:  DG Right Foot- negative for acute fracture and dislocation  Consults:  Therapeutics:  Discharge Meds:   Assessment/Plan:  Patient X-Ray negative for obvious fracture or dislocation. Pain managed in ED. Pt advised to follow up with orthopedics if symptoms persist for possibility of missed fracture diagnosis. Patient given brace while in ED, conservative therapy recommended and discussed. Patient will be dc home & is agreeable with above plan.  I personally performed the services described in this documentation, which was scribed in my presence. The recorded information has been reviewed and is accurate.    Eyvonne Mechanic, PA-C 05/20/16 2216  Rolan Bucco, MD 05/20/16 [phone]

## 2016-05-20 NOTE — Discharge Instructions (Signed)

## 2016-05-20 NOTE — ED Notes (Signed)
Pt sts right foot pain from fall in June

## 2016-05-27 DIAGNOSIS — M6702 Short Achilles tendon (acquired), left ankle: Secondary | ICD-10-CM | POA: Diagnosis not present

## 2016-05-27 DIAGNOSIS — M722 Plantar fascial fibromatosis: Secondary | ICD-10-CM | POA: Diagnosis not present

## 2016-06-04 DIAGNOSIS — Z79899 Other long term (current) drug therapy: Secondary | ICD-10-CM | POA: Diagnosis not present

## 2016-06-04 DIAGNOSIS — E7800 Pure hypercholesterolemia, unspecified: Secondary | ICD-10-CM | POA: Diagnosis not present

## 2016-06-04 DIAGNOSIS — E559 Vitamin D deficiency, unspecified: Secondary | ICD-10-CM | POA: Diagnosis not present

## 2016-06-04 DIAGNOSIS — R5383 Other fatigue: Secondary | ICD-10-CM | POA: Diagnosis not present

## 2016-06-04 DIAGNOSIS — M5442 Lumbago with sciatica, left side: Secondary | ICD-10-CM | POA: Diagnosis not present

## 2016-06-04 DIAGNOSIS — M5441 Lumbago with sciatica, right side: Secondary | ICD-10-CM | POA: Diagnosis not present

## 2016-06-04 DIAGNOSIS — G8929 Other chronic pain: Secondary | ICD-10-CM | POA: Diagnosis not present

## 2016-06-07 DIAGNOSIS — M4850XA Collapsed vertebra, not elsewhere classified, site unspecified, initial encounter for fracture: Secondary | ICD-10-CM | POA: Diagnosis not present

## 2016-06-07 DIAGNOSIS — S92211A Displaced fracture of cuboid bone of right foot, initial encounter for closed fracture: Secondary | ICD-10-CM | POA: Diagnosis not present

## 2016-06-24 DIAGNOSIS — M6702 Short Achilles tendon (acquired), left ankle: Secondary | ICD-10-CM | POA: Diagnosis not present

## 2016-06-24 DIAGNOSIS — M722 Plantar fascial fibromatosis: Secondary | ICD-10-CM | POA: Diagnosis not present

## 2016-06-28 DIAGNOSIS — G8929 Other chronic pain: Secondary | ICD-10-CM | POA: Diagnosis not present

## 2016-06-28 DIAGNOSIS — M5442 Lumbago with sciatica, left side: Secondary | ICD-10-CM | POA: Diagnosis not present

## 2016-06-28 DIAGNOSIS — Z79899 Other long term (current) drug therapy: Secondary | ICD-10-CM | POA: Diagnosis not present

## 2016-06-28 DIAGNOSIS — E7800 Pure hypercholesterolemia, unspecified: Secondary | ICD-10-CM | POA: Diagnosis not present

## 2016-06-28 DIAGNOSIS — M5441 Lumbago with sciatica, right side: Secondary | ICD-10-CM | POA: Diagnosis not present

## 2016-07-02 DIAGNOSIS — F429 Obsessive-compulsive disorder, unspecified: Secondary | ICD-10-CM | POA: Diagnosis not present

## 2016-07-02 DIAGNOSIS — F4001 Agoraphobia with panic disorder: Secondary | ICD-10-CM | POA: Diagnosis not present

## 2016-07-26 DIAGNOSIS — G8929 Other chronic pain: Secondary | ICD-10-CM | POA: Diagnosis not present

## 2016-07-26 DIAGNOSIS — E7800 Pure hypercholesterolemia, unspecified: Secondary | ICD-10-CM | POA: Diagnosis not present

## 2016-07-26 DIAGNOSIS — Z79899 Other long term (current) drug therapy: Secondary | ICD-10-CM | POA: Diagnosis not present

## 2016-07-26 DIAGNOSIS — M5442 Lumbago with sciatica, left side: Secondary | ICD-10-CM | POA: Diagnosis not present

## 2016-07-26 DIAGNOSIS — M5441 Lumbago with sciatica, right side: Secondary | ICD-10-CM | POA: Diagnosis not present

## 2016-07-30 DIAGNOSIS — F429 Obsessive-compulsive disorder, unspecified: Secondary | ICD-10-CM | POA: Diagnosis not present

## 2016-07-30 DIAGNOSIS — F4001 Agoraphobia with panic disorder: Secondary | ICD-10-CM | POA: Diagnosis not present

## 2016-08-02 ENCOUNTER — Ambulatory Visit (INDEPENDENT_AMBULATORY_CARE_PROVIDER_SITE_OTHER): Payer: Medicare Other | Admitting: Specialist

## 2016-08-02 DIAGNOSIS — M546 Pain in thoracic spine: Secondary | ICD-10-CM

## 2016-08-02 DIAGNOSIS — M545 Low back pain: Secondary | ICD-10-CM | POA: Diagnosis not present

## 2016-08-16 DIAGNOSIS — H11153 Pinguecula, bilateral: Secondary | ICD-10-CM | POA: Diagnosis not present

## 2016-08-16 DIAGNOSIS — H53001 Unspecified amblyopia, right eye: Secondary | ICD-10-CM | POA: Diagnosis not present

## 2016-08-16 DIAGNOSIS — H53002 Unspecified amblyopia, left eye: Secondary | ICD-10-CM | POA: Diagnosis not present

## 2016-08-17 DIAGNOSIS — M6702 Short Achilles tendon (acquired), left ankle: Secondary | ICD-10-CM | POA: Diagnosis not present

## 2016-08-17 DIAGNOSIS — M722 Plantar fascial fibromatosis: Secondary | ICD-10-CM | POA: Diagnosis not present

## 2016-08-17 HISTORY — PX: FOOT SURGERY: SHX648

## 2016-08-23 ENCOUNTER — Other Ambulatory Visit (INDEPENDENT_AMBULATORY_CARE_PROVIDER_SITE_OTHER): Payer: Self-pay | Admitting: Specialist

## 2016-08-23 ENCOUNTER — Inpatient Hospital Stay (INDEPENDENT_AMBULATORY_CARE_PROVIDER_SITE_OTHER): Payer: Medicare Other | Admitting: Specialist

## 2016-08-23 DIAGNOSIS — G8929 Other chronic pain: Secondary | ICD-10-CM

## 2016-08-23 DIAGNOSIS — M546 Pain in thoracic spine: Principal | ICD-10-CM

## 2016-08-24 ENCOUNTER — Telehealth (INDEPENDENT_AMBULATORY_CARE_PROVIDER_SITE_OTHER): Payer: Self-pay | Admitting: Radiology

## 2016-08-24 NOTE — Telephone Encounter (Signed)
Encounter has been closed but does not seem to be completed, just wanted to make sure this was handled.   Thank you

## 2016-08-24 NOTE — Telephone Encounter (Signed)
[facility] that she is nauseous and wants to know if Dr Lajoyce Cornersuda can give her anything for this.  Pls call her.

## 2016-08-25 ENCOUNTER — Other Ambulatory Visit (INDEPENDENT_AMBULATORY_CARE_PROVIDER_SITE_OTHER): Payer: Self-pay | Admitting: Family

## 2016-08-25 MED ORDER — ONDANSETRON HCL 8 MG PO TABS: 8.0000 mg | ORAL_TABLET | Freq: Three times a day (TID) | ORAL | 0 refills | Status: DC | PRN

## 2016-08-25 NOTE — Telephone Encounter (Signed)
Have sent in rx for zofran, just call and advise pt, thanks

## 2016-08-25 NOTE — Telephone Encounter (Signed)
Please review and advise zofran strength and directions. Appears she has been on 8mg  in past Dr. Lajoyce Cornersuda did not advise.

## 2016-08-26 ENCOUNTER — Other Ambulatory Visit (INDEPENDENT_AMBULATORY_CARE_PROVIDER_SITE_OTHER): Payer: Self-pay | Admitting: Family

## 2016-08-26 MED ORDER — ONDANSETRON HCL 8 MG PO TABS: 8.0000 mg | ORAL_TABLET | Freq: Three times a day (TID) | ORAL | 0 refills | Status: DC | PRN

## 2016-08-30 ENCOUNTER — Encounter (INDEPENDENT_AMBULATORY_CARE_PROVIDER_SITE_OTHER): Payer: Self-pay | Admitting: Radiology

## 2016-08-31 ENCOUNTER — Ambulatory Visit (INDEPENDENT_AMBULATORY_CARE_PROVIDER_SITE_OTHER): Payer: Medicare Other | Admitting: Family

## 2016-08-31 ENCOUNTER — Encounter (INDEPENDENT_AMBULATORY_CARE_PROVIDER_SITE_OTHER): Payer: Self-pay | Admitting: Family

## 2016-08-31 VITALS — Ht 66.0 in | Wt 190.0 lb

## 2016-08-31 DIAGNOSIS — M6702 Short Achilles tendon (acquired), left ankle: Secondary | ICD-10-CM

## 2016-08-31 DIAGNOSIS — M722 Plantar fascial fibromatosis: Secondary | ICD-10-CM

## 2016-08-31 NOTE — Progress Notes (Signed)
Office Visit Note   Patient: [name]           Date of Birth: 08-Jan-1966           MRN: [id number] Visit Date: 08/31/2016              Requested by: Thayer HeadingsBrian Mackenzie, MD [address], [address] [place], [place] [id number] PCP: Thayer HeadingsMACKENZIE,BRIAN, MD   Assessment & Plan: Visit Diagnoses:  1. Heel cord tightness, left   2. Plantar fasciitis of left foot     Plan: We will work aggressively on heel cord stretching. Compression stockings and elevation for swelling. Follow-up in office in 2 more weeks.  Follow-Up Instructions: No Follow-up on file.   Orders:  No orders of the defined types were placed in this encounter.  No orders of the defined types were placed in this encounter.     Procedures: No procedures performed   Clinical Data: No additional findings.   Subjective: Chief Complaint  Patient presents with  . Left Foot - Routine Post Op    08/17/16 s/p left gastroc recession and plantar fascia release.    Patient is s/p a left gastroc recession and a plantar fascia release. She is 2 weeks out from surgery and incision is well approximated without redness or drainage. States that she was on her feet a lot this past weekend and noticed a little more swelling that normal but she denies any complication and states that she is doing well. Patient does note some tenderness and says that " I am out of what he gave me for pain" she would like a refill on this if possible but has not been using anything for the last several days.    Review of Systems  Constitutional: Negative for chills and fever.  Musculoskeletal: Positive for gait problem.  All other systems reviewed and are negative.    Objective: Vital Signs: Ht 5\' 6"  (1.676 m)   Wt 190 lb (86.2 kg)   BMI 30.67 kg/m   Physical Exam  Ortho Exam  Incisions have healed well. Sutures were harvested today. Moderate swelling left lower extremity. Heel cord tightness with dorsiflexion to  90.  Specialty Comments:  No specialty comments available.  Imaging: No results found.   PMFS History: Patient Active Problem List   Diagnosis Date Noted  . Postlaminectomy syndrome, cervical region 12/09/2015  . Cervical spondylosis with radiculopathy 10/04/2015  . Spondylosis, cervical, with myelopathy 10/03/2015  . Secondary adhesive capsulitis of left shoulder 10/03/2015  . Obesity 09/21/2015  . Nausea with vomiting   . SOB (shortness of breath)   . COPD with acute exacerbation (HCC) 11/14/2014  . Hypokalemia 11/14/2014  . Acute respiratory failure (HCC) 11/14/2014  . COPD exacerbation (HCC) 11/14/2014  . Abnormal EKG 11/14/2014  . COPD GOLD 0/ still smoking  06/17/2014  . Leg swelling 06/17/2014  . Fatigue 06/17/2014  . Cigarette smoker 01/20/2014  . Pulmonary infiltrate 01/11/2014  . MIXED INCONTINENCE URGE AND STRESS 09/19/2007  . Bipolar disorder (HCC) 06/26/2007  . FIBROMYALGIA 06/26/2007  . INSOMNIA 06/26/2007   Past Medical History:  Diagnosis Date  . Abscess    rectal  . Anxiety   . Arthritis    "back; hips" (11/14/2014)  . Bipolar disorder (HCC)   . Chronic back pain    "all over"  . Chronic bronchitis (HCC)    "I get it q yr" (11/14/2014)  . COPD (chronic obstructive pulmonary disease) (HCC)   . Depression   .  Dyspnea    W/ EXERTION   . Emphysema lung (HCC)   . Endometriosis   . Fibromyalgia   . History of kidney stones   . ILD (interstitial lung disease) (HCC) 11/2013  . Migraine    "3 in the last 2 wks" (11/14/2014)  . Ovarian cyst   . Pulmonary infiltrates     Family History  Problem Relation Age of Onset  . Cancer Father     lung  . COPD Father   . Early death Father   . Hypertension Sister   . Cancer Maternal Aunt     LUNG CANCER  . Other Neg Hx     Past Surgical History:  Procedure Laterality Date  . ANTERIOR CERVICAL DECOMP/DISCECTOMY FUSION  10/03/2015   Procedure: ANTERIOR CERVICAL DISCECTOMY  AND FUSION C5-6 WITH PLATE AND  SCREWS, LOCAL AND ALLOGRAFT BONE GRAFT;  Surgeon: Kerrin ChampagneJames E Nitka, MD;  Location: [facility];  Service: Orthopedics;;  . APPENDECTOMY  1993  . BREAST BIOPSY Left 2008  . BREAST LUMPECTOMY Left 2008   "benign"  . CYST EXCISION PERINEAL Left 01/2012   Hattie Perch/notes 02/15/2012  . EXAMINATION UNDER ANESTHESIA  02/15/2012   Procedure: EXAM UNDER ANESTHESIA;  Surgeon: Maisie Fushomas A. Cornett, MD;  Location: [facility];  Service: General;  Laterality: Left;  Excision of perineal cyst  . FOOT SURGERY Left 08/17/2016   gastroc recession and plantar fascia release  . LAPAROSCOPIC CHOLECYSTECTOMY  08/2007  . LAPAROSCOPIC LYSIS OF ADHESIONS  06/2010   Hattie Perch/notes 06/18/2010  . LEFT OOPHORECTOMY Left 08/2013  . OVARIAN CYST REMOVAL  X 2-3  . SHOULDER CLOSED REDUCTION Left 10/03/2015   Procedure: CLOSED MANIPULATION SHOULDER CLOSED  AND INJECTION WITH LOCAL ANESTHETIC;  Surgeon: Kerrin ChampagneJames E Nitka, MD;  Location: [facility];  Service: Orthopedics;  Laterality: Left;  . TRANSOBTURATOR SLING  09/2008   Transobturator mid urethral sling, cystoscopy, laparoscopy w/LOA/notes 03/04/2011  . TUBAL LIGATION  1993  . VAGINAL HYSTERECTOMY  1993  . VIDEO BRONCHOSCOPY Bilateral 02/15/2014   Procedure: VIDEO BRONCHOSCOPY WITH FLUORO;  Surgeon: Coralyn HellingVineet Sood, MD;  Location: [facility];  Service: Cardiopulmonary;  Laterality: Bilateral;   Social History   Occupational History  . Not on file.   Social History Main Topics  . Smoking status: Current Every Day Smoker    Packs/day: 1.00    Years: 32.00    Types: Cigarettes  . Smokeless tobacco: Never Used     Comment: currently 1/2 ppd  . Alcohol use No  . Drug use: No  . Sexual activity: Not Currently    Birth control/ protection: Surgical

## 2016-09-01 ENCOUNTER — Ambulatory Visit
Admission: RE | Admit: 2016-09-01 | Discharge: 2016-09-01 | Disposition: A | Discharge status: Home / Self Care | Payer: Medicare Other | Source: Ambulatory Visit | Attending: Specialist | Admitting: Specialist

## 2016-09-01 DIAGNOSIS — F4310 Post-traumatic stress disorder, unspecified: Secondary | ICD-10-CM | POA: Diagnosis not present

## 2016-09-01 DIAGNOSIS — F429 Obsessive-compulsive disorder, unspecified: Secondary | ICD-10-CM | POA: Diagnosis not present

## 2016-09-01 DIAGNOSIS — F331 Major depressive disorder, recurrent, moderate: Secondary | ICD-10-CM | POA: Diagnosis not present

## 2016-09-01 DIAGNOSIS — F4001 Agoraphobia with panic disorder: Secondary | ICD-10-CM | POA: Diagnosis not present

## 2016-09-01 DIAGNOSIS — G8929 Other chronic pain: Secondary | ICD-10-CM

## 2016-09-01 DIAGNOSIS — M546 Pain in thoracic spine: Principal | ICD-10-CM

## 2016-09-02 DIAGNOSIS — E7800 Pure hypercholesterolemia, unspecified: Secondary | ICD-10-CM | POA: Diagnosis not present

## 2016-09-02 DIAGNOSIS — G8929 Other chronic pain: Secondary | ICD-10-CM | POA: Diagnosis not present

## 2016-09-02 DIAGNOSIS — M5441 Lumbago with sciatica, right side: Secondary | ICD-10-CM | POA: Diagnosis not present

## 2016-09-02 DIAGNOSIS — Z23 Encounter for immunization: Secondary | ICD-10-CM | POA: Diagnosis not present

## 2016-09-02 DIAGNOSIS — M5442 Lumbago with sciatica, left side: Secondary | ICD-10-CM | POA: Diagnosis not present

## 2016-09-02 DIAGNOSIS — Z79899 Other long term (current) drug therapy: Secondary | ICD-10-CM | POA: Diagnosis not present

## 2016-09-06 ENCOUNTER — Inpatient Hospital Stay (INDEPENDENT_AMBULATORY_CARE_PROVIDER_SITE_OTHER): Payer: Medicare Other | Admitting: Orthopedic Surgery

## 2016-09-09 NOTE — Telephone Encounter (Signed)
error 

## 2016-09-15 ENCOUNTER — Ambulatory Visit (INDEPENDENT_AMBULATORY_CARE_PROVIDER_SITE_OTHER): Payer: Medicare Other | Admitting: Family

## 2016-10-04 ENCOUNTER — Ambulatory Visit (INDEPENDENT_AMBULATORY_CARE_PROVIDER_SITE_OTHER): Payer: Medicare Other | Admitting: Specialist

## 2016-10-04 DIAGNOSIS — Z87891 Personal history of nicotine dependence: Secondary | ICD-10-CM | POA: Diagnosis not present

## 2016-10-04 DIAGNOSIS — F17200 Nicotine dependence, unspecified, uncomplicated: Secondary | ICD-10-CM | POA: Diagnosis not present

## 2016-10-04 DIAGNOSIS — R5383 Other fatigue: Secondary | ICD-10-CM | POA: Diagnosis not present

## 2016-10-04 DIAGNOSIS — M5441 Lumbago with sciatica, right side: Secondary | ICD-10-CM | POA: Diagnosis not present

## 2016-10-04 DIAGNOSIS — E7800 Pure hypercholesterolemia, unspecified: Secondary | ICD-10-CM | POA: Diagnosis not present

## 2016-10-04 DIAGNOSIS — K5903 Drug induced constipation: Secondary | ICD-10-CM | POA: Diagnosis not present

## 2016-10-04 DIAGNOSIS — Z79899 Other long term (current) drug therapy: Secondary | ICD-10-CM | POA: Diagnosis not present

## 2016-10-04 DIAGNOSIS — M5442 Lumbago with sciatica, left side: Secondary | ICD-10-CM | POA: Diagnosis not present

## 2016-10-04 DIAGNOSIS — G8929 Other chronic pain: Secondary | ICD-10-CM | POA: Diagnosis not present

## 2016-10-12 ENCOUNTER — Other Ambulatory Visit (INDEPENDENT_AMBULATORY_CARE_PROVIDER_SITE_OTHER): Payer: Self-pay | Admitting: Orthopaedic Surgery

## 2016-11-03 DIAGNOSIS — F4310 Post-traumatic stress disorder, unspecified: Secondary | ICD-10-CM | POA: Diagnosis not present

## 2016-11-03 DIAGNOSIS — F3181 Bipolar II disorder: Secondary | ICD-10-CM | POA: Diagnosis not present

## 2016-11-03 DIAGNOSIS — F4001 Agoraphobia with panic disorder: Secondary | ICD-10-CM | POA: Diagnosis not present

## 2016-11-03 DIAGNOSIS — F429 Obsessive-compulsive disorder, unspecified: Secondary | ICD-10-CM | POA: Diagnosis not present

## 2016-11-08 DIAGNOSIS — E7800 Pure hypercholesterolemia, unspecified: Secondary | ICD-10-CM | POA: Diagnosis not present

## 2016-11-08 DIAGNOSIS — M5442 Lumbago with sciatica, left side: Secondary | ICD-10-CM | POA: Diagnosis not present

## 2016-11-08 DIAGNOSIS — M5441 Lumbago with sciatica, right side: Secondary | ICD-10-CM | POA: Diagnosis not present

## 2016-11-08 DIAGNOSIS — Z79899 Other long term (current) drug therapy: Secondary | ICD-10-CM | POA: Diagnosis not present

## 2016-11-08 DIAGNOSIS — G8929 Other chronic pain: Secondary | ICD-10-CM | POA: Diagnosis not present

## 2016-11-12 ENCOUNTER — Ambulatory Visit (INDEPENDENT_AMBULATORY_CARE_PROVIDER_SITE_OTHER): Payer: Medicare Other | Admitting: Specialist

## 2016-11-23 ENCOUNTER — Other Ambulatory Visit (INDEPENDENT_AMBULATORY_CARE_PROVIDER_SITE_OTHER): Payer: Self-pay | Admitting: Orthopaedic Surgery

## 2016-11-29 DIAGNOSIS — J019 Acute sinusitis, unspecified: Secondary | ICD-10-CM | POA: Diagnosis not present

## 2016-11-29 DIAGNOSIS — M797 Fibromyalgia: Secondary | ICD-10-CM | POA: Diagnosis not present

## 2016-12-01 DIAGNOSIS — F29 Unspecified psychosis not due to a substance or known physiological condition: Secondary | ICD-10-CM | POA: Diagnosis not present

## 2016-12-01 DIAGNOSIS — F3181 Bipolar II disorder: Secondary | ICD-10-CM | POA: Diagnosis not present

## 2016-12-02 ENCOUNTER — Ambulatory Visit (INDEPENDENT_AMBULATORY_CARE_PROVIDER_SITE_OTHER): Payer: Self-pay

## 2016-12-02 ENCOUNTER — Ambulatory Visit (INDEPENDENT_AMBULATORY_CARE_PROVIDER_SITE_OTHER): Payer: Medicare Other | Admitting: Specialist

## 2016-12-02 ENCOUNTER — Encounter (INDEPENDENT_AMBULATORY_CARE_PROVIDER_SITE_OTHER): Payer: Self-pay | Admitting: Specialist

## 2016-12-02 VITALS — BP 98/61 | HR 66 | Ht 66.0 in | Wt 198.0 lb

## 2016-12-02 DIAGNOSIS — M545 Low back pain: Secondary | ICD-10-CM

## 2016-12-02 NOTE — Progress Notes (Signed)
Office Visit Note   Patient: [name]           Date of Birth: 30-Jun-1966           MRN: [id number] Visit Date: 12/02/2016              Requested by: Julie Headings, MD [address] [place], [place] [address] PCP: Julie Headings, MD   Assessment & Plan: Visit Diagnoses:  1. Low back pain, unspecified back pain laterality, unspecified chronicity, with sciatica presence unspecified     Plan: Schedule lumbar spine MRI to rule out HNP/stenosis. No narcotic medication was given today. Patient states that she discontinued pain management with [name] 2 months ago but she was not completely honest with reporting that to me since she was just given Percocet by him 11/08/2016 30 day supply. We'll follow-up after completion of her MRI to discuss results and further treatment options.  Follow-Up Instructions: Return in about 2 weeks (around 12/16/2016) for to review lumbar spine MRI.   Orders:  Orders Placed This Encounter  Procedures  . XR Lumbar Spine 2-3 Views  . MR Lumbar Spine w/o contrast   No orders of the defined types were placed in this encounter.     Procedures: No procedures performed   Clinical Data: No additional findings.   Subjective: Chief Complaint  Patient presents with  . Lower Back - Pain    [name] is here for low back pain.  She states that she had a fall down some steps at her daughters house at the end of July or early August 2017.    [name] had previously ordered a lumbar spine MRI October 2017 of some reason this was not done. He also ordered a thoracic spine MRI which was done 09/01/2016 and report read unremarkable thoracic MRI. No focal compression fracture, disc protrusion, or intraspinal mass lesion. States that most of her pain is centered around her lumbar spine. Some extension into the buttock area. No complete of leg weakness, numbness tingling. No bowel or bladder incontinence. Pain is aggravated with twisting  bending. Patient requesting refill of pain medication. Upon questioning she states that she has been established with pain management at [facility] under the guidance of [name].  states that she remove herself from his care about 2 months ago. A couple of her [place] DEA report and patient received Percocet 5/325 from [name] 11/08/2016 and was given a 30 day supply. Patient also received xanax 112 tablets from [facility] 11/18/2016 along with Ambien from the same practice 11/23/2016.    Review of Systems  HENT: Negative.   Cardiovascular: Negative.   Gastrointestinal: Negative.   Musculoskeletal: Positive for back pain.  Neurological: Negative for weakness and numbness.     Objective: Vital Signs: BP 98/61 (BP Location: Left Arm, Patient Position: Sitting)   Pulse 66   Ht 5\' 6"  (1.676 m)   Wt 198 lb (89.8 kg)   BMI 31.96 kg/m   Physical Exam  Constitutional: No distress.  HENT:  Head: Normocephalic and atraumatic.  Eyes: EOM are normal. Pupils are equal, round, and reactive to light.  Neck: Normal range of motion.  Pulmonary/Chest: No respiratory distress.  Abdominal: She exhibits no distension.  Musculoskeletal:  Gait is normal. She has some low back discomfort with lumbar extension. Lumbar paraspinal tenderness. Negative logroll bilateral hips. Negative straight leg raise. Neurovascularly intact. No focal motor deficits.   Neurological: She is  alert.  Skin: Skin is warm and dry.    Ortho Exam  Specialty Comments:  No specialty comments available.  Imaging: Xr Lumbar Spine 2-3 Views  Result Date: 12/02/2016 Lumbar spine x-rays show some disc space narrowing at L3-4 L4-5. Some lower some lower facet changes. No acute findings.    PMFS History: Patient Active Problem List   Diagnosis Date Noted  . Postlaminectomy syndrome, cervical region 12/09/2015  . Cervical spondylosis with radiculopathy 10/04/2015  . Spondylosis,  cervical, with myelopathy 10/03/2015  . Secondary adhesive capsulitis of left shoulder 10/03/2015  . Obesity 09/21/2015  . Nausea with vomiting   . SOB (shortness of breath)   . COPD with acute exacerbation (HCC) 11/14/2014  . Hypokalemia 11/14/2014  . Acute respiratory failure (HCC) 11/14/2014  . COPD exacerbation (HCC) 11/14/2014  . Abnormal EKG 11/14/2014  . COPD GOLD 0/ still smoking  06/17/2014  . Leg swelling 06/17/2014  . Fatigue 06/17/2014  . Cigarette smoker 01/20/2014  . Pulmonary infiltrate 01/11/2014  . MIXED INCONTINENCE URGE AND STRESS 09/19/2007  . Bipolar disorder (HCC) 06/26/2007  . FIBROMYALGIA 06/26/2007  . INSOMNIA 06/26/2007   Past Medical History:  Diagnosis Date  . Abscess    rectal  . Anxiety   . Arthritis    "back; hips" (11/14/2014)  . Bipolar disorder (HCC)   . Chronic back pain    "all over"  . Chronic bronchitis (HCC)    "I get it q yr" (11/14/2014)  . COPD (chronic obstructive pulmonary disease) (HCC)   . Depression   . Dyspnea    W/ EXERTION   . Emphysema lung (HCC)   . Endometriosis   . Fibromyalgia   . History of kidney stones   . ILD (interstitial lung disease) (HCC) 11/2013  . Migraine    "3 in the last 2 wks" (11/14/2014)  . Ovarian cyst   . Pulmonary infiltrates     Family History  Problem Relation Age of Onset  . Cancer Father     lung  . COPD Father   . Early death Father   . Hypertension Sister   . Cancer Maternal Aunt     LUNG CANCER  . Other Neg Hx     Past Surgical History:  Procedure Laterality Date  . ANTERIOR CERVICAL DECOMP/DISCECTOMY FUSION  10/03/2015   Procedure: ANTERIOR CERVICAL DISCECTOMY  AND FUSION C5-6 WITH PLATE AND SCREWS, LOCAL AND ALLOGRAFT BONE GRAFT;  Surgeon: Kerrin Champagne, MD;  Location: [facility];  Service: Orthopedics;;  . APPENDECTOMY  1993  . BREAST BIOPSY Left 2008  . BREAST LUMPECTOMY Left 2008   "benign"  . CYST EXCISION PERINEAL Left 01/2012   Hattie Perch 02/15/2012  . EXAMINATION UNDER  ANESTHESIA  02/15/2012   Procedure: EXAM UNDER ANESTHESIA;  Surgeon: Maisie Fus A. Cornett, MD;  Location: [facility];  Service: General;  Laterality: Left;  Excision of perineal cyst  . FOOT SURGERY Left 08/17/2016   gastroc recession and plantar fascia release  . LAPAROSCOPIC CHOLECYSTECTOMY  08/2007  . LAPAROSCOPIC LYSIS OF ADHESIONS  06/2010   Hattie Perch 06/18/2010  . LEFT OOPHORECTOMY Left 08/2013  . OVARIAN CYST REMOVAL  X 2-3  . SHOULDER CLOSED REDUCTION Left 10/03/2015   Procedure: CLOSED MANIPULATION SHOULDER CLOSED  AND INJECTION WITH LOCAL ANESTHETIC;  Surgeon: Kerrin Champagne, MD;  Location: [facility];  Service: Orthopedics;  Laterality: Left;  . TRANSOBTURATOR SLING  09/2008   Transobturator mid urethral sling, cystoscopy, laparoscopy w/LOA/notes 03/04/2011  . TUBAL LIGATION  1993  . VAGINAL HYSTERECTOMY  1993  . VIDEO BRONCHOSCOPY Bilateral 02/15/2014   Procedure: VIDEO BRONCHOSCOPY WITH FLUORO;  Surgeon: Coralyn HellingVineet Sood, MD;  Location: [facility];  Service: Cardiopulmonary;  Laterality: Bilateral;   Social History   Occupational History  . Not on file.   Social History Main Topics  . Smoking status: Current Every Day Smoker    Packs/day: 1.00    Years: 32.00    Types: Cigarettes  . Smokeless tobacco: Never Used     Comment: currently 1/2 ppd  . Alcohol use No  . Drug use: No  . Sexual activity: Not Currently    Birth control/ protection: Surgical

## 2016-12-08 ENCOUNTER — Ambulatory Visit (HOSPITAL_COMMUNITY): Admission: RE | Admit: 2016-12-08 | Payer: Medicare Other | Source: Ambulatory Visit

## 2016-12-14 ENCOUNTER — Ambulatory Visit (HOSPITAL_COMMUNITY)
Admission: RE | Admit: 2016-12-14 | Discharge: 2016-12-14 | Disposition: A | Discharge status: Home / Self Care | Payer: Medicare Other | Source: Ambulatory Visit | Attending: Surgery | Admitting: Surgery

## 2016-12-14 DIAGNOSIS — M5136 Other intervertebral disc degeneration, lumbar region: Secondary | ICD-10-CM | POA: Diagnosis not present

## 2016-12-14 DIAGNOSIS — M545 Low back pain: Secondary | ICD-10-CM | POA: Diagnosis not present

## 2016-12-24 DIAGNOSIS — G8929 Other chronic pain: Secondary | ICD-10-CM | POA: Diagnosis not present

## 2016-12-24 DIAGNOSIS — Z79899 Other long term (current) drug therapy: Secondary | ICD-10-CM | POA: Diagnosis not present

## 2016-12-24 DIAGNOSIS — M5441 Lumbago with sciatica, right side: Secondary | ICD-10-CM | POA: Diagnosis not present

## 2016-12-24 DIAGNOSIS — M5442 Lumbago with sciatica, left side: Secondary | ICD-10-CM | POA: Diagnosis not present

## 2016-12-28 DIAGNOSIS — F330 Major depressive disorder, recurrent, mild: Secondary | ICD-10-CM | POA: Diagnosis not present

## 2017-01-17 ENCOUNTER — Ambulatory Visit (INDEPENDENT_AMBULATORY_CARE_PROVIDER_SITE_OTHER): Payer: Medicare Other | Admitting: Specialist

## 2017-01-21 DIAGNOSIS — M6530 Trigger finger, unspecified finger: Secondary | ICD-10-CM | POA: Diagnosis not present

## 2017-01-21 DIAGNOSIS — E559 Vitamin D deficiency, unspecified: Secondary | ICD-10-CM | POA: Diagnosis not present

## 2017-01-21 DIAGNOSIS — M5442 Lumbago with sciatica, left side: Secondary | ICD-10-CM | POA: Diagnosis not present

## 2017-01-21 DIAGNOSIS — E7800 Pure hypercholesterolemia, unspecified: Secondary | ICD-10-CM | POA: Diagnosis not present

## 2017-01-21 DIAGNOSIS — Z79899 Other long term (current) drug therapy: Secondary | ICD-10-CM | POA: Diagnosis not present

## 2017-01-21 DIAGNOSIS — G43909 Migraine, unspecified, not intractable, without status migrainosus: Secondary | ICD-10-CM | POA: Diagnosis not present

## 2017-01-21 DIAGNOSIS — M5441 Lumbago with sciatica, right side: Secondary | ICD-10-CM | POA: Diagnosis not present

## 2017-01-24 ENCOUNTER — Ambulatory Visit (INDEPENDENT_AMBULATORY_CARE_PROVIDER_SITE_OTHER): Payer: Medicare Other | Admitting: Orthopaedic Surgery

## 2017-01-26 DIAGNOSIS — F3181 Bipolar II disorder: Secondary | ICD-10-CM | POA: Diagnosis not present

## 2017-02-04 DIAGNOSIS — R079 Chest pain, unspecified: Secondary | ICD-10-CM | POA: Diagnosis not present

## 2017-02-04 DIAGNOSIS — M79662 Pain in left lower leg: Secondary | ICD-10-CM | POA: Diagnosis not present

## 2017-02-04 DIAGNOSIS — F17210 Nicotine dependence, cigarettes, uncomplicated: Secondary | ICD-10-CM | POA: Diagnosis not present

## 2017-02-04 DIAGNOSIS — R600 Localized edema: Secondary | ICD-10-CM | POA: Diagnosis not present

## 2017-02-04 DIAGNOSIS — Z885 Allergy status to narcotic agent status: Secondary | ICD-10-CM | POA: Diagnosis not present

## 2017-02-04 DIAGNOSIS — J449 Chronic obstructive pulmonary disease, unspecified: Secondary | ICD-10-CM | POA: Diagnosis not present

## 2017-02-07 ENCOUNTER — Ambulatory Visit (INDEPENDENT_AMBULATORY_CARE_PROVIDER_SITE_OTHER): Payer: Medicare Other | Admitting: Orthopaedic Surgery

## 2017-02-07 ENCOUNTER — Ambulatory Visit (INDEPENDENT_AMBULATORY_CARE_PROVIDER_SITE_OTHER): Payer: Self-pay

## 2017-02-07 ENCOUNTER — Encounter (INDEPENDENT_AMBULATORY_CARE_PROVIDER_SITE_OTHER): Payer: Self-pay | Admitting: Orthopaedic Surgery

## 2017-02-07 DIAGNOSIS — M1712 Unilateral primary osteoarthritis, left knee: Secondary | ICD-10-CM | POA: Insufficient documentation

## 2017-02-07 MED ORDER — BUPIVACAINE HCL 0.5 % IJ SOLN
2.0000 mL | INTRAMUSCULAR | Status: AC | PRN
  Administered 2017-02-07: 2 mL via INTRA_ARTICULAR

## 2017-02-07 MED ORDER — METHYLPREDNISOLONE ACETATE 40 MG/ML IJ SUSP
40.0000 mg | INTRAMUSCULAR | Status: AC | PRN
  Administered 2017-02-07: 40 mg via INTRA_ARTICULAR

## 2017-02-07 MED ORDER — LIDOCAINE HCL 1 % IJ SOLN
2.0000 mL | INTRAMUSCULAR | Status: AC | PRN
  Administered 2017-02-07: 2 mL

## 2017-02-07 NOTE — Progress Notes (Signed)
Office Visit Note   Patient: [name]           Date of Birth: 14-Sep-1966           MRN: [id number] Visit Date: 02/07/2017              Requested by: Thayer Headings, MD [address] [place], [place] [address] PCP: Thayer Headings, MD   Assessment & Plan: Visit Diagnoses:  1. Primary osteoarthritis of left knee     Plan: Impression is osteoarthritis flareup. Injection was performed today. Patient tolerated this well. Over-the-counter NSAIDs as needed. Follow-up with me as needed. If not better we'll consider MRI. To rule out meniscal tear.  Follow-Up Instructions: Return if symptoms worsen or fail to improve.   Orders:  Orders Placed This Encounter  Procedures  . XR KNEE 3 VIEW LEFT   No orders of the defined types were placed in this encounter.     Procedures: Large Joint Inj Date/Time: 02/07/2017 5:02 PM Performed by: Tarry Kos Authorized by: Tarry Kos   Consent Given by:  Patient Timeout: prior to procedure the correct patient, procedure, and site was verified   Indications:  Pain Location:  Knee Site:  R knee Prep: patient was prepped and draped in usual sterile fashion   Needle Size:  22 G Ultrasound Guidance: No   Fluoroscopic Guidance: No   Arthrogram: No   Medications:  2 mL lidocaine 1 %; 2 mL bupivacaine 0.5 %; 40 mg methylPREDNISolone acetate 40 MG/ML Patient tolerance:  Patient tolerated the procedure well with no immediate complications     Clinical Data: No additional findings.   Subjective: Chief Complaint  Patient presents with  . Left Knee - Pain    Patient is a well-known patient of mine who comes in with left knee pain for about a month. This is progressively gotten worse with swelling and pain that radiates down the leg this constant 8 out of 10 throughout the knee.    Review of Systems  Constitutional: Negative.   HENT: Negative.   Eyes: Negative.   Respiratory: Negative.   Cardiovascular:  Negative.   Endocrine: Negative.   Musculoskeletal: Negative.   Neurological: Negative.   Hematological: Negative.   Psychiatric/Behavioral: Negative.   All other systems reviewed and are negative.    Objective: Vital Signs: There were no vitals taken for this visit.  Physical Exam  Constitutional: She is oriented to person, place, and time. She appears well-developed and well-nourished.  Pulmonary/Chest: Effort normal.  Neurological: She is alert and oriented to person, place, and time.  Skin: Skin is warm. Capillary refill takes less than 2 seconds.  Psychiatric: She has a normal mood and affect. Her behavior is normal. Judgment and thought content normal.  Nursing note and vitals reviewed.   Ortho Exam Left knee exam shows no joint effusion. She has no real reproducible pain with palpation. Collaterals and cruciates are stable. Specialty Comments:  No specialty comments available.  Imaging: Xr Knee 3 View Left  Result Date: 02/07/2017 Mild medial joint space narrowing    PMFS History: Patient Active Problem List   Diagnosis Date Noted  . Primary osteoarthritis of left knee 02/07/2017  . Postlaminectomy syndrome, cervical region 12/09/2015  . Cervical spondylosis with radiculopathy 10/04/2015  . Spondylosis, cervical, with myelopathy 10/03/2015  . Secondary adhesive capsulitis of left shoulder 10/03/2015  . Obesity 09/21/2015  . Nausea with vomiting   . SOB (shortness of breath)   . COPD  with acute exacerbation (HCC) 11/14/2014  . Hypokalemia 11/14/2014  . Acute respiratory failure (HCC) 11/14/2014  . COPD exacerbation (HCC) 11/14/2014  . Abnormal EKG 11/14/2014  . COPD GOLD 0/ still smoking  06/17/2014  . Leg swelling 06/17/2014  . Fatigue 06/17/2014  . Cigarette smoker 01/20/2014  . Pulmonary infiltrate 01/11/2014  . MIXED INCONTINENCE URGE AND STRESS 09/19/2007  . Bipolar disorder (HCC) 06/26/2007  . FIBROMYALGIA 06/26/2007  . INSOMNIA 06/26/2007   Past  Medical History:  Diagnosis Date  . Abscess    rectal  . Anxiety   . Arthritis    "back; hips" (11/14/2014)  . Bipolar disorder (HCC)   . Chronic back pain    "all over"  . Chronic bronchitis (HCC)    "I get it q yr" (11/14/2014)  . COPD (chronic obstructive pulmonary disease) (HCC)   . Depression   . Dyspnea    W/ EXERTION   . Emphysema lung (HCC)   . Endometriosis   . Fibromyalgia   . History of kidney stones   . ILD (interstitial lung disease) (HCC) 11/2013  . Migraine    "3 in the last 2 wks" (11/14/2014)  . Ovarian cyst   . Pulmonary infiltrates     Family History  Problem Relation Age of Onset  . Cancer Father     lung  . COPD Father   . Early death Father   . Hypertension Sister   . Cancer Maternal Aunt     LUNG CANCER  . Other Neg Hx     Past Surgical History:  Procedure Laterality Date  . ANTERIOR CERVICAL DECOMP/DISCECTOMY FUSION  10/03/2015   Procedure: ANTERIOR CERVICAL DISCECTOMY  AND FUSION C5-6 WITH PLATE AND SCREWS, LOCAL AND ALLOGRAFT BONE GRAFT;  Surgeon: Kerrin Champagne, MD;  Location: [facility];  Service: Orthopedics;;  . APPENDECTOMY  1993  . BREAST BIOPSY Left 2008  . BREAST LUMPECTOMY Left 2008   "benign"  . CYST EXCISION PERINEAL Left 01/2012   Hattie Perch 02/15/2012  . EXAMINATION UNDER ANESTHESIA  02/15/2012   Procedure: EXAM UNDER ANESTHESIA;  Surgeon: Maisie Fus A. Cornett, MD;  Location: [facility];  Service: General;  Laterality: Left;  Excision of perineal cyst  . FOOT SURGERY Left 08/17/2016   gastroc recession and plantar fascia release  . LAPAROSCOPIC CHOLECYSTECTOMY  08/2007  . LAPAROSCOPIC LYSIS OF ADHESIONS  06/2010   Hattie Perch 06/18/2010  . LEFT OOPHORECTOMY Left 08/2013  . OVARIAN CYST REMOVAL  X 2-3  . SHOULDER CLOSED REDUCTION Left 10/03/2015   Procedure: CLOSED MANIPULATION SHOULDER CLOSED  AND INJECTION WITH LOCAL ANESTHETIC;  Surgeon: Kerrin Champagne, MD;  Location: [facility];  Service: Orthopedics;  Laterality: Left;  . TRANSOBTURATOR  SLING  09/2008   Transobturator mid urethral sling, cystoscopy, laparoscopy w/LOA/notes 03/04/2011  . TUBAL LIGATION  1993  . VAGINAL HYSTERECTOMY  1993  . VIDEO BRONCHOSCOPY Bilateral 02/15/2014   Procedure: VIDEO BRONCHOSCOPY WITH FLUORO;  Surgeon: Coralyn Helling, MD;  Location: [facility];  Service: Cardiopulmonary;  Laterality: Bilateral;   Social History   Occupational History  . Not on file.   Social History Main Topics  . Smoking status: Current Every Day Smoker    Packs/day: 1.00    Years: 32.00    Types: Cigarettes  . Smokeless tobacco: Never Used     Comment: currently 1/2 ppd  . Alcohol use No  . Drug use: No  . Sexual activity: Not Currently    Birth control/ protection: Surgical

## 2017-02-14 ENCOUNTER — Telehealth (INDEPENDENT_AMBULATORY_CARE_PROVIDER_SITE_OTHER): Payer: Self-pay | Admitting: *Deleted

## 2017-02-14 MED ORDER — TRAMADOL HCL 50 MG PO TABS: ORAL_TABLET | ORAL | 0 refills | Status: DC

## 2017-02-14 NOTE — Telephone Encounter (Signed)
Called Rx into [place], [facility] for pt letting her know this

## 2017-02-14 NOTE — Telephone Encounter (Signed)
Pt calling asking for pain medication.

## 2017-02-14 NOTE — Telephone Encounter (Signed)
Please advise on message. 

## 2017-02-14 NOTE — Telephone Encounter (Signed)
Tramadol 1 tab po bid prn pain #30

## 2017-02-15 ENCOUNTER — Ambulatory Visit (INDEPENDENT_AMBULATORY_CARE_PROVIDER_SITE_OTHER): Payer: Medicare Other | Admitting: Orthopaedic Surgery

## 2017-02-15 ENCOUNTER — Encounter (INDEPENDENT_AMBULATORY_CARE_PROVIDER_SITE_OTHER): Payer: Self-pay | Admitting: Orthopaedic Surgery

## 2017-02-15 DIAGNOSIS — M1712 Unilateral primary osteoarthritis, left knee: Secondary | ICD-10-CM | POA: Diagnosis not present

## 2017-02-15 NOTE — Progress Notes (Signed)
Office Visit Note   Patient: [name]           Date of Birth: November 12, 1965           MRN: [id number] Visit Date: 02/15/2017              Requested by: Thayer Headings, MD [address] [place], [place] [address] PCP: Thayer Headings, MD   Assessment & Plan: Visit Diagnoses:  1. Primary osteoarthritis of left knee     Plan: Recommend MRI to rule out meniscal pathology or structural abnormality. Patient has failed to respond appropriately to conservative treatment. Follow-up after MRI. Tramadol refilled.  Follow-Up Instructions: Return in about 10 days (around 02/25/2017).   Orders:  No orders of the defined types were placed in this encounter.  No orders of the defined types were placed in this encounter.     Procedures: No procedures performed   Clinical Data: No additional findings.   Subjective: Chief Complaint  Patient presents with  . Left Knee - Pain, Follow-up    [name] comes back today for continued left knee pain. She is having problems. She just fell over the over a dog gait yesterday because of pain. The injection helped for only a couple of days.    Review of Systems   Objective: Vital Signs: There were no vitals taken for this visit.  Physical Exam  Ortho Exam Left knee exam shows no joint effusion. Exam is otherwise unchanged. Specialty Comments:  No specialty comments available.  Imaging: No results found.   PMFS History: Patient Active Problem List   Diagnosis Date Noted  . Primary osteoarthritis of left knee 02/07/2017  . Postlaminectomy syndrome, cervical region 12/09/2015  . Cervical spondylosis with radiculopathy 10/04/2015  . Spondylosis, cervical, with myelopathy 10/03/2015  . Secondary adhesive capsulitis of left shoulder 10/03/2015  . Obesity 09/21/2015  . Nausea with vomiting   . SOB (shortness of breath)   . COPD with acute exacerbation (HCC) 11/14/2014  . Hypokalemia 11/14/2014  . Acute  respiratory failure (HCC) 11/14/2014  . COPD exacerbation (HCC) 11/14/2014  . Abnormal EKG 11/14/2014  . COPD GOLD 0/ still smoking  06/17/2014  . Leg swelling 06/17/2014  . Fatigue 06/17/2014  . Cigarette smoker 01/20/2014  . Pulmonary infiltrate 01/11/2014  . MIXED INCONTINENCE URGE AND STRESS 09/19/2007  . Bipolar disorder (HCC) 06/26/2007  . FIBROMYALGIA 06/26/2007  . INSOMNIA 06/26/2007   Past Medical History:  Diagnosis Date  . Abscess    rectal  . Anxiety   . Arthritis    "back; hips" (11/14/2014)  . Bipolar disorder (HCC)   . Chronic back pain    "all over"  . Chronic bronchitis (HCC)    "I get it q yr" (11/14/2014)  . COPD (chronic obstructive pulmonary disease) (HCC)   . Depression   . Dyspnea    W/ EXERTION   . Emphysema lung (HCC)   . Endometriosis   . Fibromyalgia   . History of kidney stones   . ILD (interstitial lung disease) (HCC) 11/2013  . Migraine    "3 in the last 2 wks" (11/14/2014)  . Ovarian cyst   . Pulmonary infiltrates     Family History  Problem Relation Age of Onset  . Cancer Father     lung  . COPD Father   . Early death Father   . Hypertension Sister   . Cancer Maternal Aunt     LUNG CANCER  . Other Neg Hx  Past Surgical History:  Procedure Laterality Date  . ANTERIOR CERVICAL DECOMP/DISCECTOMY FUSION  10/03/2015   Procedure: ANTERIOR CERVICAL DISCECTOMY  AND FUSION C5-6 WITH PLATE AND SCREWS, LOCAL AND ALLOGRAFT BONE GRAFT;  Surgeon: Kerrin Champagne, MD;  Location: [facility];  Service: Orthopedics;;  . APPENDECTOMY  1993  . BREAST BIOPSY Left 2008  . BREAST LUMPECTOMY Left 2008   "benign"  . CYST EXCISION PERINEAL Left 01/2012   [name] 02/15/2012  . EXAMINATION UNDER ANESTHESIA  02/15/2012   Procedure: EXAM UNDER ANESTHESIA;  Surgeon: Maisie Fus A. Cornett, MD;  Location: [facility];  Service: General;  Laterality: Left;  Excision of perineal cyst  . FOOT SURGERY Left 08/17/2016   gastroc recession and plantar fascia  release  . LAPAROSCOPIC CHOLECYSTECTOMY  08/2007  . LAPAROSCOPIC LYSIS OF ADHESIONS  06/2010   [name] 06/18/2010  . LEFT OOPHORECTOMY Left 08/2013  . OVARIAN CYST REMOVAL  X 2-3  . SHOULDER CLOSED REDUCTION Left 10/03/2015   Procedure: CLOSED MANIPULATION SHOULDER CLOSED  AND INJECTION WITH LOCAL ANESTHETIC;  Surgeon: Kerrin Champagne, MD;  Location: [facility];  Service: Orthopedics;  Laterality: Left;  . TRANSOBTURATOR SLING  09/2008   Transobturator mid urethral sling, cystoscopy, laparoscopy w/LOA/notes 03/04/2011  . TUBAL LIGATION  1993  . VAGINAL HYSTERECTOMY  1993  . VIDEO BRONCHOSCOPY Bilateral 02/15/2014   Procedure: VIDEO BRONCHOSCOPY WITH FLUORO;  Surgeon: Coralyn Helling, MD;  Location: [facility];  Service: Cardiopulmonary;  Laterality: Bilateral;   Social History   Occupational History  . Not on file.   Social History Main Topics  . Smoking status: Current Every Day Smoker    Packs/day: 1.00    Years: 32.00    Types: Cigarettes  . Smokeless tobacco: Never Used     Comment: currently 1/2 ppd  . Alcohol use No  . Drug use: No  . Sexual activity: Not Currently    Birth control/ protection: Surgical

## 2017-02-15 NOTE — Addendum Note (Signed)
Addended by: Albertina Parr on: 02/15/2017 02:56 PM   Modules accepted: Orders

## 2017-02-21 DIAGNOSIS — K5903 Drug induced constipation: Secondary | ICD-10-CM | POA: Diagnosis not present

## 2017-02-21 DIAGNOSIS — G8929 Other chronic pain: Secondary | ICD-10-CM | POA: Diagnosis not present

## 2017-02-21 DIAGNOSIS — M5442 Lumbago with sciatica, left side: Secondary | ICD-10-CM | POA: Diagnosis not present

## 2017-02-21 DIAGNOSIS — M5441 Lumbago with sciatica, right side: Secondary | ICD-10-CM | POA: Diagnosis not present

## 2017-02-21 DIAGNOSIS — Z79899 Other long term (current) drug therapy: Secondary | ICD-10-CM | POA: Diagnosis not present

## 2017-02-24 DIAGNOSIS — F3181 Bipolar II disorder: Secondary | ICD-10-CM | POA: Diagnosis not present

## 2017-02-28 ENCOUNTER — Ambulatory Visit (INDEPENDENT_AMBULATORY_CARE_PROVIDER_SITE_OTHER): Payer: Medicare Other | Admitting: Orthopaedic Surgery

## 2017-03-05 ENCOUNTER — Ambulatory Visit
Admission: RE | Admit: 2017-03-05 | Discharge: 2017-03-05 | Disposition: A | Discharge status: Home / Self Care | Payer: Medicare Other | Source: Ambulatory Visit | Attending: Orthopaedic Surgery | Admitting: Orthopaedic Surgery

## 2017-03-05 DIAGNOSIS — M25562 Pain in left knee: Secondary | ICD-10-CM | POA: Diagnosis not present

## 2017-03-05 DIAGNOSIS — M1712 Unilateral primary osteoarthritis, left knee: Secondary | ICD-10-CM

## 2017-03-08 ENCOUNTER — Encounter (INDEPENDENT_AMBULATORY_CARE_PROVIDER_SITE_OTHER): Payer: Self-pay | Admitting: Orthopaedic Surgery

## 2017-03-08 ENCOUNTER — Ambulatory Visit (INDEPENDENT_AMBULATORY_CARE_PROVIDER_SITE_OTHER): Payer: Medicare Other | Admitting: Orthopaedic Surgery

## 2017-03-08 ENCOUNTER — Other Ambulatory Visit (INDEPENDENT_AMBULATORY_CARE_PROVIDER_SITE_OTHER): Payer: Self-pay | Admitting: Orthopaedic Surgery

## 2017-03-08 DIAGNOSIS — M1712 Unilateral primary osteoarthritis, left knee: Secondary | ICD-10-CM | POA: Diagnosis not present

## 2017-03-08 MED ORDER — DICLOFENAC SODIUM 75 MG PO TBEC: 75.0000 mg | DELAYED_RELEASE_TABLET | Freq: Two times a day (BID) | ORAL | 2 refills | Status: DC

## 2017-03-08 NOTE — Progress Notes (Signed)
Office Visit Note   Patient: [name]           Date of Birth: 06/28/1966           MRN: [id number] Visit Date: 03/08/2017              Requested by: Thayer HeadingsMackenzie, Brian, MD [address], [address] [place], [place] [id number] PCP: Thayer HeadingsMackenzie, Brian, MD   Assessment & Plan: Visit Diagnoses:  1. Primary osteoarthritis of left knee     Plan: MRI essentially shows moderate degenerative joint disease of the medial compartment. She does have some degenerative wear and tear of the menisci but no frank tears. At this point we will treat this as arthritis. Diclofenac was prescribed. We will submit for Synvisc injection. Follow up with me for the injection.  Follow-Up Instructions: Return if symptoms worsen or fail to improve.   Orders:  No orders of the defined types were placed in this encounter.  Meds ordered this encounter  Medications  . diclofenac (VOLTAREN) 75 MG EC tablet    Sig: Take 1 tablet (75 mg total) by mouth 2 (two) times daily.    Dispense:  30 tablet    Refill:  2      Procedures: No procedures performed   Clinical Data: No additional findings.   Subjective: Chief Complaint  Patient presents with  . Left Knee - Follow-up, Pain    Patient follows up today for MRI. She is requesting arthritis medicine. She denies any mechanical symptoms. She does endorse chronic pain.    Review of Systems   Objective: Vital Signs: There were no vitals taken for this visit.  Physical Exam  Ortho Exam Left knee exam is stable. Specialty Comments:  No specialty comments available.  Imaging: No results found.   PMFS History: Patient Active Problem List   Diagnosis Date Noted  . Primary osteoarthritis of left knee 02/07/2017  . Postlaminectomy syndrome, cervical region 12/09/2015  . Cervical spondylosis with radiculopathy 10/04/2015  . Spondylosis, cervical, with myelopathy 10/03/2015  . Secondary adhesive capsulitis of left shoulder 10/03/2015  .  Obesity 09/21/2015  . Nausea with vomiting   . SOB (shortness of breath)   . COPD with acute exacerbation (HCC) 11/14/2014  . Hypokalemia 11/14/2014  . Acute respiratory failure (HCC) 11/14/2014  . COPD exacerbation (HCC) 11/14/2014  . Abnormal EKG 11/14/2014  . COPD GOLD 0/ still smoking  06/17/2014  . Leg swelling 06/17/2014  . Fatigue 06/17/2014  . Cigarette smoker 01/20/2014  . Pulmonary infiltrate 01/11/2014  . MIXED INCONTINENCE URGE AND STRESS 09/19/2007  . Bipolar disorder (HCC) 06/26/2007  . FIBROMYALGIA 06/26/2007  . INSOMNIA 06/26/2007   Past Medical History:  Diagnosis Date  . Abscess    rectal  . Anxiety   . Arthritis    "back; hips" (11/14/2014)  . Bipolar disorder (HCC)   . Chronic back pain    "all over"  . Chronic bronchitis (HCC)    "I get it q yr" (11/14/2014)  . COPD (chronic obstructive pulmonary disease) (HCC)   . Depression   . Dyspnea    W/ EXERTION   . Emphysema lung (HCC)   . Endometriosis   . Fibromyalgia   . History of kidney stones   . ILD (interstitial lung disease) (HCC) 11/2013  . Migraine    "3 in the last 2 wks" (11/14/2014)  . Ovarian cyst   . Pulmonary infiltrates     Family History  Problem Relation Age of Onset  .  Cancer Father     lung  . COPD Father   . Early death Father   . Hypertension Sister   . Cancer Maternal Aunt     LUNG CANCER  . Other Neg Hx     Past Surgical History:  Procedure Laterality Date  . ANTERIOR CERVICAL DECOMP/DISCECTOMY FUSION  10/03/2015   Procedure: ANTERIOR CERVICAL DISCECTOMY  AND FUSION C5-6 WITH PLATE AND SCREWS, LOCAL AND ALLOGRAFT BONE GRAFT;  Surgeon: Kerrin Champagne, MD;  Location: [facility];  Service: Orthopedics;;  . APPENDECTOMY  1993  . BREAST BIOPSY Left 2008  . BREAST LUMPECTOMY Left 2008   "benign"  . CYST EXCISION PERINEAL Left 01/2012   Hattie Perch 02/15/2012  . EXAMINATION UNDER ANESTHESIA  02/15/2012   Procedure: EXAM UNDER ANESTHESIA;  Surgeon: Maisie Fus A. Cornett, MD;  Location: [facility];  Service: General;  Laterality: Left;  Excision of perineal cyst  . FOOT SURGERY Left 08/17/2016   gastroc recession and plantar fascia release  . LAPAROSCOPIC CHOLECYSTECTOMY  08/2007  . LAPAROSCOPIC LYSIS OF ADHESIONS  06/2010   Hattie Perch 06/18/2010  . LEFT OOPHORECTOMY Left 08/2013  . OVARIAN CYST REMOVAL  X 2-3  . SHOULDER CLOSED REDUCTION Left 10/03/2015   Procedure: CLOSED MANIPULATION SHOULDER CLOSED  AND INJECTION WITH LOCAL ANESTHETIC;  Surgeon: Kerrin Champagne, MD;  Location: [facility];  Service: Orthopedics;  Laterality: Left;  . TRANSOBTURATOR SLING  09/2008   Transobturator mid urethral sling, cystoscopy, laparoscopy w/LOA/notes 03/04/2011  . TUBAL LIGATION  1993  . VAGINAL HYSTERECTOMY  1993  . VIDEO BRONCHOSCOPY Bilateral 02/15/2014   Procedure: VIDEO BRONCHOSCOPY WITH FLUORO;  Surgeon: Coralyn Helling, MD;  Location: [facility];  Service: Cardiopulmonary;  Laterality: Bilateral;   Social History   Occupational History  . Not on file.   Social History Main Topics  . Smoking status: Current Every Day Smoker    Packs/day: 1.00    Years: 32.00    Types: Cigarettes  . Smokeless tobacco: Never Used     Comment: currently 1/2 ppd  . Alcohol use No  . Drug use: No  . Sexual activity: Not Currently    Birth control/ protection: Surgical

## 2017-03-24 DIAGNOSIS — F3181 Bipolar II disorder: Secondary | ICD-10-CM | POA: Diagnosis not present

## 2017-04-08 ENCOUNTER — Ambulatory Visit (INDEPENDENT_AMBULATORY_CARE_PROVIDER_SITE_OTHER): Payer: Medicare Other | Admitting: Orthopaedic Surgery

## 2017-04-14 ENCOUNTER — Ambulatory Visit (INDEPENDENT_AMBULATORY_CARE_PROVIDER_SITE_OTHER): Payer: Medicare Other | Admitting: Orthopaedic Surgery

## 2017-04-14 DIAGNOSIS — M1712 Unilateral primary osteoarthritis, left knee: Secondary | ICD-10-CM

## 2017-04-14 MED ORDER — HYLAN G-F 20 48 MG/6ML IX SOSY
48.0000 mg | PREFILLED_SYRINGE | INTRA_ARTICULAR | Status: AC | PRN
  Administered 2017-04-14: 48 mg via INTRA_ARTICULAR

## 2017-04-14 NOTE — Progress Notes (Signed)
   Procedure Note  Patient: [name]             Date of Birth: May 27, 1966           MRN: [id number]             Visit Date: 04/14/2017  Procedures: Visit Diagnoses: Primary osteoarthritis of left knee  Large Joint Inj Date/Time: 04/14/2017 11:11 AM Performed by: Tarry KosXU, Shante Archambeault M Authorized by: Tarry KosXU, Mariona Scholes M   Consent Given by:  Patient Timeout: prior to procedure the correct patient, procedure, and site was verified   Indications:  Pain Location:  Knee Site:  L knee Prep: patient was prepped and draped in usual sterile fashion   Needle Size:  22 G Approach:  Anterolateral Ultrasound Guidance: No   Fluoroscopic Guidance: No   Arthrogram: No   Medications:  48 mg Hylan 48 MG/6ML

## 2017-04-21 DIAGNOSIS — F28 Other psychotic disorder not due to a substance or known physiological condition: Secondary | ICD-10-CM | POA: Diagnosis not present

## 2017-04-21 DIAGNOSIS — F3181 Bipolar II disorder: Secondary | ICD-10-CM | POA: Diagnosis not present

## 2017-05-26 DIAGNOSIS — F3181 Bipolar II disorder: Secondary | ICD-10-CM | POA: Diagnosis not present

## 2017-05-31 ENCOUNTER — Encounter (INDEPENDENT_AMBULATORY_CARE_PROVIDER_SITE_OTHER): Payer: Self-pay | Admitting: Specialist

## 2017-05-31 ENCOUNTER — Ambulatory Visit (INDEPENDENT_AMBULATORY_CARE_PROVIDER_SITE_OTHER): Payer: Medicare Other | Admitting: Specialist

## 2017-05-31 ENCOUNTER — Ambulatory Visit (INDEPENDENT_AMBULATORY_CARE_PROVIDER_SITE_OTHER): Payer: Medicare Other

## 2017-05-31 VITALS — BP 125/74 | HR 106

## 2017-05-31 DIAGNOSIS — M542 Cervicalgia: Secondary | ICD-10-CM

## 2017-05-31 DIAGNOSIS — G8929 Other chronic pain: Secondary | ICD-10-CM

## 2017-05-31 DIAGNOSIS — M25511 Pain in right shoulder: Secondary | ICD-10-CM

## 2017-05-31 DIAGNOSIS — M5412 Radiculopathy, cervical region: Secondary | ICD-10-CM | POA: Diagnosis not present

## 2017-05-31 MED ORDER — BUPIVACAINE HCL 0.25 % IJ SOLN
4.0000 mL | INTRAMUSCULAR | Status: AC | PRN
  Administered 2017-05-31: 4 mL via INTRA_ARTICULAR

## 2017-05-31 MED ORDER — LIDOCAINE HCL 1 % IJ SOLN
1.0000 mL | INTRAMUSCULAR | Status: AC | PRN
  Administered 2017-05-31: 1 mL

## 2017-05-31 MED ORDER — METHYLPREDNISOLONE ACETATE 40 MG/ML IJ SUSP
40.0000 mg | INTRAMUSCULAR | Status: AC | PRN
  Administered 2017-05-31: 40 mg via INTRA_ARTICULAR

## 2017-05-31 NOTE — Progress Notes (Signed)
Office Visit Note   Patient: [name]           Date of Birth: 12/13/1965           MRN: [id number] Visit Date: 05/31/2017              Requested by: Thayer HeadingsMackenzie, Brian, MD [address], [address] [place], [place] [id number] PCP: Thayer HeadingsMackenzie, Brian, MD   Assessment & Plan: Visit Diagnoses:  1. Neck pain   2. Chronic right shoulder pain   3. Radiculopathy, cervical region     Plan: To try to give patient some relief of her shoulder pain offered injection. After patient consent right shoulder was prepped and subacromial Marcaine/Depo-Medrol injection performed. Patient states that she did have relief of her shoulder pain with Marcaine in place.  60 mL containing have ongoing chronic neck pain and bilateral upper extremity radiculopathy with failed conservative treatment we will schedule CT scan without contrast cervical spine. Follow-up the office after completion to discuss results and further treatment options. If patient continues to have ongoing issues with right shoulder we may also need to consider further imaging there with an MRI. No narcotic medication given today.  Follow-Up Instructions: Return in about 3 weeks (around 06/21/2017).   Orders:  Orders Placed This Encounter  Procedures  . XR Cervical Spine 2 or 3 views  . XR Shoulder Right  . CT CERVICAL SPINE WO CONTRAST   No orders of the defined types were placed in this encounter.     Procedures: Large Joint Inj Date/Time: 05/31/2017 3:33 PM Performed by: Naida SleightWENS, Linn Goetze M Authorized by: Naida SleightWENS, Danese Dorsainvil M   Consent Given by:  Patient Indications:  Pain Location:  Shoulder Site:  R subacromial bursa Needle Size:  25 G Needle Length:  1.5 inches Approach:  Posterior Ultrasound Guidance: No   Fluoroscopic Guidance: No   Arthrogram: No   Medications:  4 mL bupivacaine 0.25 %; 40 mg methylPREDNISolone acetate 40 MG/ML; 1 mL lidocaine 1 % Aspiration Attempted: No   Patient tolerance:  Patient tolerated the  procedure well with no immediate complications  Patient has had multiple Depo-Medrol injections for her knee. Did not have any adverse effects with that. Patient agreed to proceed with subacromial Marcaine/Depo-Medrol injection today. Tolerated without complication.     Clinical Data: No additional findings.   Subjective: Chief Complaint  Patient presents with  . Neck - Pain    HPI Patient comes in today with complaints of neck pain more on the right side and right greater than left shoulder pain. Status post C5-6 ACDF 10/03/2015.  Patient states that a few days ago she tripped landing on the left side of her face and body. She began having increased pain in the right side of her neck. Right greater than left shoulder pain. Right shoulder also aggravated with reaching overhead and behind her back. States that she's had previous bilateral shoulder rotator cuff repairs by Dr. Simonne ComeAplington several years ago. She complains of pain that radiates to both upper arms. Review of Systems Current cardiac pulmonary GI GU issues.  Objective: Vital Signs: BP 125/74   Pulse (!) 106   Physical Exam  Ortho Exam  Specialty Comments:  No specialty comments available.  Imaging: No results found.   PMFS History: Patient Active Problem List   Diagnosis Date Noted  . Primary osteoarthritis of left knee 02/07/2017  . Postlaminectomy syndrome, cervical region 12/09/2015  . Cervical spondylosis with radiculopathy 10/04/2015  . Spondylosis, cervical, with myelopathy  10/03/2015  . Secondary adhesive capsulitis of left shoulder 10/03/2015  . Obesity 09/21/2015  . Nausea with vomiting   . SOB (shortness of breath)   . COPD with acute exacerbation (HCC) 11/14/2014  . Hypokalemia 11/14/2014  . Acute respiratory failure (HCC) 11/14/2014  . COPD exacerbation (HCC) 11/14/2014  . Abnormal EKG 11/14/2014  . COPD GOLD 0/ still smoking  06/17/2014  . Leg swelling 06/17/2014  . Fatigue 06/17/2014  .  Cigarette smoker 01/20/2014  . Pulmonary infiltrate 01/11/2014  . MIXED INCONTINENCE URGE AND STRESS 09/19/2007  . Bipolar disorder (HCC) 06/26/2007  . FIBROMYALGIA 06/26/2007  . INSOMNIA 06/26/2007   Past Medical History:  Diagnosis Date  . Abscess    rectal  . Anxiety   . Arthritis    "back; hips" (11/14/2014)  . Bipolar disorder (HCC)   . Chronic back pain    "all over"  . Chronic bronchitis (HCC)    "I get it q yr" (11/14/2014)  . COPD (chronic obstructive pulmonary disease) (HCC)   . Depression   . Dyspnea    W/ EXERTION   . Emphysema lung (HCC)   . Endometriosis   . Fibromyalgia   . History of kidney stones   . ILD (interstitial lung disease) (HCC) 11/2013  . Migraine    "3 in the last 2 wks" (11/14/2014)  . Ovarian cyst   . Pulmonary infiltrates     Family History  Problem Relation Age of Onset  . Cancer Father        lung  . COPD Father   . Early death Father   . Hypertension Sister   . Cancer Maternal Aunt        LUNG CANCER  . Other Neg Hx     Past Surgical History:  Procedure Laterality Date  . ANTERIOR CERVICAL DECOMP/DISCECTOMY FUSION  10/03/2015   Procedure: ANTERIOR CERVICAL DISCECTOMY  AND FUSION C5-6 WITH PLATE AND SCREWS, LOCAL AND ALLOGRAFT BONE GRAFT;  Surgeon: Kerrin ChampagneJames E Nitka, MD;  Location: [facility];  Service: Orthopedics;;  . APPENDECTOMY  1993  . BREAST BIOPSY Left 2008  . BREAST LUMPECTOMY Left 2008   "benign"  . CYST EXCISION PERINEAL Left 01/2012   Hattie Perch/notes 02/15/2012  . EXAMINATION UNDER ANESTHESIA  02/15/2012   Procedure: EXAM UNDER ANESTHESIA;  Surgeon: Maisie Fushomas A. Cornett, MD;  Location: [facility];  Service: General;  Laterality: Left;  Excision of perineal cyst  . FOOT SURGERY Left 08/17/2016   gastroc recession and plantar fascia release  . LAPAROSCOPIC CHOLECYSTECTOMY  08/2007  . LAPAROSCOPIC LYSIS OF ADHESIONS  06/2010   Hattie Perch/notes 06/18/2010  . LEFT OOPHORECTOMY Left 08/2013  . OVARIAN CYST REMOVAL  X 2-3  . SHOULDER CLOSED  REDUCTION Left 10/03/2015   Procedure: CLOSED MANIPULATION SHOULDER CLOSED  AND INJECTION WITH LOCAL ANESTHETIC;  Surgeon: Kerrin ChampagneJames E Nitka, MD;  Location: [facility];  Service: Orthopedics;  Laterality: Left;  . TRANSOBTURATOR SLING  09/2008   Transobturator mid urethral sling, cystoscopy, laparoscopy w/LOA/notes 03/04/2011  . TUBAL LIGATION  1993  . VAGINAL HYSTERECTOMY  1993  . VIDEO BRONCHOSCOPY Bilateral 02/15/2014   Procedure: VIDEO BRONCHOSCOPY WITH FLUORO;  Surgeon: Coralyn HellingVineet Sood, MD;  Location: [facility];  Service: Cardiopulmonary;  Laterality: Bilateral;   Social History   Occupational History  . Not on file.   Social History Main Topics  . Smoking status: Current Every Day Smoker    Packs/day: 1.00    Years: 32.00    Types: Cigarettes  . Smokeless  tobacco: Former Neurosurgeon    Quit date: 08/28/2016     Comment: currently 1/2 ppd/ STATES SHE QUIT OCT 2017  . Alcohol use No  . Drug use: No  . Sexual activity: Not Currently    Birth control/ protection: Surgical

## 2017-06-07 ENCOUNTER — Ambulatory Visit
Admission: RE | Admit: 2017-06-07 | Discharge: 2017-06-07 | Disposition: A | Discharge status: Home / Self Care | Payer: Medicare Other | Source: Ambulatory Visit | Attending: Surgery | Admitting: Surgery

## 2017-06-07 DIAGNOSIS — M5011 Cervical disc disorder with radiculopathy,  high cervical region: Secondary | ICD-10-CM | POA: Diagnosis not present

## 2017-06-07 DIAGNOSIS — M5412 Radiculopathy, cervical region: Secondary | ICD-10-CM

## 2017-06-07 DIAGNOSIS — M50121 Cervical disc disorder at C4-C5 level with radiculopathy: Secondary | ICD-10-CM | POA: Diagnosis not present

## 2017-06-07 DIAGNOSIS — M50122 Cervical disc disorder at C5-C6 level with radiculopathy: Secondary | ICD-10-CM | POA: Diagnosis not present

## 2017-06-07 DIAGNOSIS — M50123 Cervical disc disorder at C6-C7 level with radiculopathy: Secondary | ICD-10-CM | POA: Diagnosis not present

## 2017-07-05 DIAGNOSIS — F3181 Bipolar II disorder: Secondary | ICD-10-CM | POA: Diagnosis not present

## 2017-07-18 ENCOUNTER — Encounter (INDEPENDENT_AMBULATORY_CARE_PROVIDER_SITE_OTHER): Payer: Self-pay

## 2017-07-18 ENCOUNTER — Ambulatory Visit (INDEPENDENT_AMBULATORY_CARE_PROVIDER_SITE_OTHER): Payer: Medicare Other | Admitting: Specialist

## 2017-07-20 ENCOUNTER — Encounter (INDEPENDENT_AMBULATORY_CARE_PROVIDER_SITE_OTHER): Payer: Self-pay | Admitting: Specialist

## 2017-07-20 ENCOUNTER — Ambulatory Visit (INDEPENDENT_AMBULATORY_CARE_PROVIDER_SITE_OTHER): Payer: Medicare Other | Admitting: Specialist

## 2017-07-20 ENCOUNTER — Telehealth (INDEPENDENT_AMBULATORY_CARE_PROVIDER_SITE_OTHER): Payer: Self-pay | Admitting: Specialist

## 2017-07-20 VITALS — BP 100/68 | HR 73 | Ht 66.0 in | Wt 198.0 lb

## 2017-07-20 DIAGNOSIS — M47812 Spondylosis without myelopathy or radiculopathy, cervical region: Secondary | ICD-10-CM | POA: Diagnosis not present

## 2017-07-20 DIAGNOSIS — M7581 Other shoulder lesions, right shoulder: Secondary | ICD-10-CM | POA: Diagnosis not present

## 2017-07-20 DIAGNOSIS — M5136 Other intervertebral disc degeneration, lumbar region: Secondary | ICD-10-CM | POA: Diagnosis not present

## 2017-07-20 DIAGNOSIS — M778 Other enthesopathies, not elsewhere classified: Secondary | ICD-10-CM

## 2017-07-20 DIAGNOSIS — K219 Gastro-esophageal reflux disease without esophagitis: Secondary | ICD-10-CM | POA: Diagnosis not present

## 2017-07-20 MED ORDER — PANTOPRAZOLE SODIUM 20 MG PO TBEC: 20.0000 mg | DELAYED_RELEASE_TABLET | Freq: Every day | ORAL | 3 refills | Status: DC

## 2017-07-20 MED ORDER — DICLOFENAC-MISOPROSTOL 50-0.2 MG PO TBEC: 1.0000 | DELAYED_RELEASE_TABLET | Freq: Two times a day (BID) | ORAL | 3 refills | Status: DC

## 2017-07-20 NOTE — Patient Instructions (Signed)
Avoid overhead lifting and overhead use of the arms. Do not lift greater than 10 lbs. Arthrotec one twice a day with meal or snack for pain and inflamation.  PT for another 2-3 visits, then a home exercise program.

## 2017-07-20 NOTE — Telephone Encounter (Signed)
Patient called stating that she needs a prior authorization to get the arthritis medicine filled.  CB# [phone].  Thank you.

## 2017-07-20 NOTE — Progress Notes (Signed)
Office Visit Note   Patient: [name]           Date of Birth: September 10, 1966           MRN: [id number] Visit Date: 07/20/2017              Requested by: Thayer Headings, MD [address], [address] [place], [place] [address] PCP: Thayer Headings, MD   Assessment & Plan: Visit Diagnoses:  1. Spondylosis without myelopathy or radiculopathy, cervical region   2. Shoulder tendonitis, right   3. Degenerative disc disease, lumbar   4. Gastroesophageal reflux disease without esophagitis     Plan: Avoid overhead lifting and overhead use of the arms. Do not lift greater than 10 lbs. Arthrotec one twice a day with meal or snack for pain and inflamation.  PT for another 2-3 visits, then a home exercise program.  Follow-Up Instructions: Return in about 8 weeks (around 09/14/2017).   Orders:  No orders of the defined types were placed in this encounter.  Meds ordered this encounter  Medications  . pantoprazole (PROTONIX) 20 MG tablet    Sig: Take 1 tablet (20 mg total) by mouth daily.    Dispense:  30 tablet    Refill:  3  . Diclofenac-Misoprostol 50-0.2 MG TBEC    Sig: Take 1 tablet by mouth 2 (two) times daily after a meal.    Dispense:  60 tablet    Refill:  3      Procedures: No procedures performed   Clinical Data: No additional findings.   Subjective: Chief Complaint  Patient presents with  . Neck - Follow-up    CT Scan Review CSP    51 year old female with history of right shoulder and neck pain, s/p ACDF C5-6 for HNP some persistent stiffness and cervicalgia since before her surgery. Cervical spine surgery Somewhat better than preop. She notices more stiffness. No bowel or bladder difficulties. No arm numbness. Injection right shoulder SAS by Zonia Kief one month ago only provided very short term improvement in her symptoms. Previous right shoulder arthroscopy by Dr. Simonne Come at [facility] years.    Review of Systems  Constitutional: Negative.     HENT: Negative.   Eyes: Negative.   Respiratory: Negative.   Cardiovascular: Negative.  Negative for leg swelling.  Gastrointestinal: Positive for nausea and vomiting. Negative for abdominal pain, anal bleeding and diarrhea.  Endocrine: Negative.   Genitourinary: Negative.   Musculoskeletal: Negative.   Skin: Negative.   Allergic/Immunologic: Negative.   Neurological: Negative.   Hematological: Negative.   Psychiatric/Behavioral: Negative.      Objective: Vital Signs: BP 100/68 (BP Location: Left Arm, Patient Position: Sitting)   Pulse 73   Ht  (1.676 m)   Wt 198 lb (89.8 kg)   BMI 31.96 kg/m   Physical Exam  Constitutional: She is oriented to person, place, and time. She appears well-developed and well-nourished.  HENT:  Head: Normocephalic and atraumatic.  Eyes: Pupils are equal, round, and reactive to light. EOM are normal.  Neck: Normal range of motion. Neck supple.  Pulmonary/Chest: Effort normal and breath sounds normal.  Abdominal: Soft. Bowel sounds are normal.  Neurological: She is alert and oriented to person, place, and time.  Skin: Skin is warm and dry.  Psychiatric: She has a normal mood and affect. Her behavior is normal. Judgment and thought content normal.    Back Exam   Tenderness  The patient is experiencing tenderness in the cervical.  Range of Motion  Extension: normal  Flexion: normal  Lateral Bend Right: abnormal  Lateral Bend Left: normal  Rotation Right: abnormal  Rotation Left: normal   Muscle Strength  Right Quadriceps:  5/5  Left Quadriceps:  5/5  Right Hamstrings:  5/5  Left Hamstrings:  5/5   Tests  Straight leg raise right: negative Straight leg raise left: negative  Reflexes  Patellar: normal Achilles: normal Biceps: normal Babinski's sign: normal   Other  Toe Walk: normal Heel Walk: normal Sensation: normal Gait: normal  Erythema: no back redness Scars: absent   Right Shoulder Exam   Tenderness  The  patient is experiencing tenderness in the acromion and acromioclavicular joint.  Range of Motion  Active Abduction: abnormal  Passive Abduction: abnormal  Extension: normal  Forward Flexion: normal  External Rotation: normal  Internal Rotation 0 degrees: T12  Internal Rotation 90 degrees: 70   Muscle Strength  Abduction: 4/5  Internal Rotation: 5/5  External Rotation: 5/5  Supraspinatus: 5/5  Subscapularis: 5/5  Biceps: 5/5   Tests  Apprehension: positive Impingement: positive  Other  Erythema: absent Scars: present Sensation: normal Pulse: present      Specialty Comments:  No specialty comments available.  Imaging: No results found.   PMFS History: Patient Active Problem List   Diagnosis Date Noted  . Spondylosis, cervical, with myelopathy 10/03/2015    Priority: High  . Primary osteoarthritis of left knee 02/07/2017  . Postlaminectomy syndrome, cervical region 12/09/2015  . Cervical spondylosis with radiculopathy 10/04/2015  . Secondary adhesive capsulitis of left shoulder 10/03/2015  . Obesity 09/21/2015  . Nausea with vomiting   . SOB (shortness of breath)   . COPD with acute exacerbation (HCC) 11/14/2014  . Hypokalemia 11/14/2014  . Acute respiratory failure (HCC) 11/14/2014  . COPD exacerbation (HCC) 11/14/2014  . Abnormal EKG 11/14/2014  . COPD GOLD 0/ still smoking  06/17/2014  . Leg swelling 06/17/2014  . Fatigue 06/17/2014  . Cigarette smoker 01/20/2014  . Pulmonary infiltrate 01/11/2014  . MIXED INCONTINENCE URGE AND STRESS 09/19/2007  . Bipolar disorder (HCC) 06/26/2007  . FIBROMYALGIA 06/26/2007  . INSOMNIA 06/26/2007   Past Medical History:  Diagnosis Date  . Abscess    rectal  . Anxiety   . Arthritis    "back; hips" (11/14/2014)  . Bipolar disorder (HCC)   . Chronic back pain    "all over"  . Chronic bronchitis (HCC)    "I get it q yr" (11/14/2014)  . COPD (chronic obstructive pulmonary disease) (HCC)   . Depression   .  Dyspnea    W/ EXERTION   . Emphysema lung (HCC)   . Endometriosis   . Fibromyalgia   . History of kidney stones   . ILD (interstitial lung disease) (HCC) 11/2013  . Migraine    "3 in the last 2 wks" (11/14/2014)  . Ovarian cyst   . Pulmonary infiltrates     Family History  Problem Relation Age of Onset  . Cancer Father        lung  . COPD Father   . Early death Father   . Hypertension Sister   . Cancer Maternal Aunt        LUNG CANCER  . Other Neg Hx     Past Surgical History:  Procedure Laterality Date  . ANTERIOR CERVICAL DECOMP/DISCECTOMY FUSION  10/03/2015   Procedure: ANTERIOR CERVICAL DISCECTOMY  AND FUSION C5-6 WITH PLATE AND SCREWS, LOCAL AND ALLOGRAFT BONE GRAFT;  Surgeon: Guy Sandifer  Otelia Sergeant, MD;  Location: [facility];  Service: Orthopedics;;  . APPENDECTOMY  1993  . BREAST BIOPSY Left 2008  . BREAST LUMPECTOMY Left 2008   "benign"  . CYST EXCISION PERINEAL Left 01/2012   Hattie Perch 02/15/2012  . EXAMINATION UNDER ANESTHESIA  02/15/2012   Procedure: EXAM UNDER ANESTHESIA;  Surgeon: Maisie Fus A. Cornett, MD;  Location: [facility];  Service: General;  Laterality: Left;  Excision of perineal cyst  . FOOT SURGERY Left 08/17/2016   gastroc recession and plantar fascia release  . LAPAROSCOPIC CHOLECYSTECTOMY  08/2007  . LAPAROSCOPIC LYSIS OF ADHESIONS  06/2010   Hattie Perch 06/18/2010  . LEFT OOPHORECTOMY Left 08/2013  . OVARIAN CYST REMOVAL  X 2-3  . SHOULDER CLOSED REDUCTION Left 10/03/2015   Procedure: CLOSED MANIPULATION SHOULDER CLOSED  AND INJECTION WITH LOCAL ANESTHETIC;  Surgeon: Kerrin Champagne, MD;  Location: [facility];  Service: Orthopedics;  Laterality: Left;  . TRANSOBTURATOR SLING  09/2008   Transobturator mid urethral sling, cystoscopy, laparoscopy w/LOA/notes 03/04/2011  . TUBAL LIGATION  1993  . VAGINAL HYSTERECTOMY  1993  . VIDEO BRONCHOSCOPY Bilateral 02/15/2014   Procedure: VIDEO BRONCHOSCOPY WITH FLUORO;  Surgeon: Coralyn Helling, MD;  Location: [facility];  Service:  Cardiopulmonary;  Laterality: Bilateral;   Social History   Occupational History  . Not on file.   Social History Main Topics  . Smoking status: Current Every Day Smoker    Packs/day: 1.00    Years: 32.00    Types: Cigarettes  . Smokeless tobacco: Former Neurosurgeon    Quit date: 08/28/2016     Comment: currently 1/2 ppd/ STATES SHE QUIT OCT 2017  . Alcohol use No  . Drug use: No  . Sexual activity: Not Currently    Birth control/ protection: Surgical

## 2017-07-22 NOTE — Telephone Encounter (Signed)
Faxed papers back to insurance--pending approval

## 2017-07-25 ENCOUNTER — Ambulatory Visit: Payer: Medicare Other | Attending: Internal Medicine | Admitting: Physical Therapy

## 2017-07-25 ENCOUNTER — Encounter: Payer: Self-pay | Admitting: Physical Therapy

## 2017-07-25 ENCOUNTER — Encounter: Payer: Self-pay | Admitting: Specialist

## 2017-07-25 DIAGNOSIS — M542 Cervicalgia: Secondary | ICD-10-CM

## 2017-07-25 DIAGNOSIS — Z1231 Encounter for screening mammogram for malignant neoplasm of breast: Secondary | ICD-10-CM | POA: Diagnosis not present

## 2017-07-25 DIAGNOSIS — M25511 Pain in right shoulder: Secondary | ICD-10-CM | POA: Insufficient documentation

## 2017-07-25 DIAGNOSIS — G8929 Other chronic pain: Secondary | ICD-10-CM | POA: Diagnosis not present

## 2017-07-25 NOTE — Therapy (Addendum)
[facility] [address] [place], [place], [address] Phone: [phone]   Fax:  [phone]  Physical Therapy Evaluation  Patient Details  Name: ANJELITA SHEAHAN MRN: [id number] Date of Birth: 1966/04/16 Referring Provider: Vira Browns, MD  Encounter Date: 07/25/2017      PT End of Session - 07/25/17 1549    Visit Number 1   Number of Visits 5   Date for PT Re-Evaluation 09/02/17   Authorization Type [employer] no visit limit   PT Start Time 1545   PT Stop Time 1627   PT Time Calculation (min) 42 min   Activity Tolerance Patient tolerated treatment well   Behavior During Therapy [facility] for tasks assessed/performed      Past Medical History:  Diagnosis Date  . Abscess    rectal  . Anxiety   . Arthritis    "back; hips" (11/14/2014)  . Bipolar disorder (HCC)   . Chronic back pain    "all over"  . Chronic bronchitis (HCC)    "I get it q yr" (11/14/2014)  . COPD (chronic obstructive pulmonary disease) (HCC)   . Depression   . Dyspnea    W/ EXERTION   . Emphysema lung (HCC)   . Endometriosis   . Fibromyalgia   . History of kidney stones   . ILD (interstitial lung disease) (HCC) 11/2013  . Migraine    "3 in the last 2 wks" (11/14/2014)  . Ovarian cyst   . Pulmonary infiltrates     Past Surgical History:  Procedure Laterality Date  . ANTERIOR CERVICAL DECOMP/DISCECTOMY FUSION  10/03/2015   Procedure: ANTERIOR CERVICAL DISCECTOMY  AND FUSION C5-6 WITH PLATE AND SCREWS, LOCAL AND ALLOGRAFT BONE GRAFT;  Surgeon: Kerrin Champagne, MD;  Location: [facility];  Service: Orthopedics;;  . APPENDECTOMY  1993  . BREAST BIOPSY Left 2008  . BREAST LUMPECTOMY Left 2008   "benign"  . CYST EXCISION PERINEAL Left 01/2012   Hattie Perch 02/15/2012  . EXAMINATION UNDER ANESTHESIA  02/15/2012   Procedure: EXAM UNDER ANESTHESIA;  Surgeon: Maisie Fus A. Cornett, MD;  Location: [facility];  Service: General;  Laterality: Left;  Excision of perineal  cyst  . FOOT SURGERY Left 08/17/2016   gastroc recession and plantar fascia release  . LAPAROSCOPIC CHOLECYSTECTOMY  08/2007  . LAPAROSCOPIC LYSIS OF ADHESIONS  06/2010   Hattie Perch 06/18/2010  . LEFT OOPHORECTOMY Left 08/2013  . OVARIAN CYST REMOVAL  X 2-3  . SHOULDER CLOSED REDUCTION Left 10/03/2015   Procedure: CLOSED MANIPULATION SHOULDER CLOSED  AND INJECTION WITH LOCAL ANESTHETIC;  Surgeon: Kerrin Champagne, MD;  Location: [facility];  Service: Orthopedics;  Laterality: Left;  . TRANSOBTURATOR SLING  09/2008   Transobturator mid urethral sling, cystoscopy, laparoscopy w/LOA/notes 03/04/2011  . TUBAL LIGATION  1993  . VAGINAL HYSTERECTOMY  1993  . VIDEO BRONCHOSCOPY Bilateral 02/15/2014   Procedure: VIDEO BRONCHOSCOPY WITH FLUORO;  Surgeon: Coralyn Helling, MD;  Location: [facility];  Service: Cardiopulmonary;  Laterality: Bilateral;    There were no vitals filed for this visit.       Subjective Assessment - 07/25/17 1550    Subjective Pain extends from R side of neck along posterior aspect of shoulder, began about 6 mo ago, insidious onset. Pain can become severe. Pt reports migraines about 1/mo in temporal region. Tries to carry a light purse.    Patient Stated Goals driving, household chores- clean tub, sleep   Currently in Pain? Yes   Pain Score 7  Pain Location Shoulder   Pain Orientation Right;Posterior   Pain Descriptors / Indicators Tightness;Aching   Pain Type Chronic pain   Pain Onset More than a month ago   Pain Frequency Constant   Aggravating Factors  reaching, lifting, carrying   Pain Relieving Factors medications, ice/heat            [facility] PT Assessment - 07/25/17 0001      Assessment   Medical Diagnosis Rt shoulder cuff tendonitis, cervical spondylosis   Referring Provider Vira Browns, MD   Onset Date/Surgical Date --  6 months   Hand Dominance Right   Next MD Visit --  November, 15- approx   Prior Therapy --  for neck after surgery, last year     Precautions    Precautions None     Restrictions   Weight Bearing Restrictions No     Balance Screen   Has the patient fallen in the past 6 months No     Home Environment   Living Environment Private residence   Living Arrangements Spouse/significant other;Children   Additional Comments no stairs at home     Prior Function   Level of Independence Independent   Vocation On disability   Leisure reading     Cognition   Overall Cognitive Status Within Functional Limits for tasks assessed     Observation/Other Assessments   Focus on Therapeutic Outcomes (FOTO)  56% limited (goal 46%)     Sensation   Additional Comments WFL     Posture/Postural Control   Posture Comments kyphotic with rounded shoulders, forward head, tends to lean onto her L arm when seated     ROM / Strength   AROM / PROM / Strength AROM;Strength     AROM   AROM Assessment Site Shoulder;Cervical   Right/Left Shoulder Right   Right Shoulder Flexion 150 Degrees  pain   Right Shoulder ABduction 138 Degrees  pain   Right Shoulder Internal Rotation --  L2, L to T12   Right Shoulder External Rotation --  both to C5   Cervical Flexion 30   Cervical Extension 24   Cervical - Right Side Bend 24   Cervical - Left Side Bend 18   Cervical - Right Rotation 48   Cervical - Left Rotation 48     Strength   Overall Strength Comments all others good strength with pain   Strength Assessment Site Shoulder   Right/Left Shoulder Right;Left   Right Shoulder External Rotation 4/5     Palpation   Palpation comment concordant pain along R upper trap            Objective measurements completed on examination: See above findings.          [facility] Adult PT Treatment/Exercise - 07/25/17 0001      Exercises   Exercises Neck     Neck Exercises: Standing   Other Standing Exercises row green tband     Neck Exercises: Seated   Other Seated Exercise scapular retraction   Other Seated Exercise shoulder ER red tband      Manual Therapy   Manual Therapy Soft tissue mobilization   Soft tissue mobilization IASTM R upper trap     Neck Exercises: Stretches   Upper Trapezius Stretch Limitations seated   Corner Stretch Limitations door pec stretch                PT Education - 07/25/17 1727    Education provided Yes   Education Details  anatomy of condition, POC, HEP, exercise form/rationale, importance of postural alignment   Person(s) Educated Patient   Methods Explanation;Tactile cues;Verbal cues;Demonstration;Handout   Comprehension Verbalized understanding;Returned demonstration;Verbal cues required;Tactile cues required;Need further instruction             PT Long Term Goals - 07/25/17 1725      PT LONG TERM GOAL #1   Title Pt will be able to read without limitation by neck/shoulder pain   Baseline limited at eval   Time 5   Period Weeks   Status New   Target Date 09/02/17     PT LONG TERM GOAL #2   Title Pt will be able to complete household chores and be aware to take rest breaks to stretch/correct posture   Baseline hands off some chores to others at eval   Time 5   Period Weeks   Status New   Target Date 09/02/17     PT LONG TERM GOAL #3   Title pt will be able to drive without limitation by neck/shoulder pain   Baseline limited at eval   Time 5   Period Weeks   Status New   Target Date 09/02/17     PT LONG TERM GOAL #4   Title FOTO to 46% limitation to indicate significant improvement in functional ability   Baseline 56% limitation at eval   Time 5   Period Weeks   Status New   Target Date 09/02/17       GCODE: 56% limited (goal 46%) Current CL Goal CK         Plan - 07/25/17 1720    Clinical Impression Statement Pt presents to PT with complaints of R- side neck and shoulder pain that has worsened over last 6 months. ROM and strength are good but all painful, weakness noted in external rotation on R. Limited cervical motion due to pain, ACDF C5/6.  Concordant pain found in R upper trap that is significantly tight. Pt will benefit from skilled PT in order to decrease overuse of upper traps by training posture and strengthening periscapular musculature.    History and Personal Factors relevant to plan of care: anxiety, bipolar disorder, depression, fibromyalgia, COPD   Clinical Presentation Evolving   Clinical Presentation due to: worsening pain over 6 mo   Clinical Decision Making Moderate   Rehab Potential Good   PT Frequency --  2/week 1 week followed by 1/week   PT Duration --  5 weeks   PT Treatment/Interventions ADLs/Self Care Home Management;Cryotherapy;Electrical Stimulation;Iontophoresis /ml Dexamethasone;Functional mobility training;Ultrasound;Traction;Moist Heat;Therapeutic activities;Therapeutic exercise;Neuromuscular re-education;Patient/family education;Passive range of motion;Scar mobilization;Manual techniques;Dry needling;Taping   PT Next Visit Plan DN R upper trap, periscapular strengthening   PT Home Exercise Plan upright/midline posture, scapular retraction, upper trap stretch, ER red tband, rows green tband   Consulted and Agree with Plan of Care Patient      Patient will benefit from skilled therapeutic intervention in order to improve the following deficits and impairments:  Impaired UE functional use, Increased muscle spasms, Decreased activity tolerance, Pain, Improper body mechanics, Impaired flexibility, Decreased strength, Postural dysfunction  Visit Diagnosis: Cervicalgia - Plan: PT plan of care cert/re-cert  Chronic right shoulder pain - Plan: PT plan of care cert/re-cert     Problem List Patient Active Problem List   Diagnosis Date Noted  . Primary osteoarthritis of left knee 02/07/2017  . Postlaminectomy syndrome, cervical region 12/09/2015  . Cervical spondylosis with radiculopathy 10/04/2015  . Spondylosis, cervical, with myelopathy 10/03/2015  .  Secondary adhesive capsulitis of left shoulder  10/03/2015  . Obesity 09/21/2015  . Nausea with vomiting   . SOB (shortness of breath)   . COPD with acute exacerbation (HCC) 11/14/2014  . Hypokalemia 11/14/2014  . Acute respiratory failure (HCC) 11/14/2014  . COPD exacerbation (HCC) 11/14/2014  . Abnormal EKG 11/14/2014  . COPD GOLD 0/ still smoking  06/17/2014  . Leg swelling 06/17/2014  . Fatigue 06/17/2014  . Cigarette smoker 01/20/2014  . Pulmonary infiltrate 01/11/2014  . MIXED INCONTINENCE URGE AND STRESS 09/19/2007  . Bipolar disorder (HCC) 06/26/2007  . FIBROMYALGIA 06/26/2007  . INSOMNIA 06/26/2007    Luismanuel Corman C. Woodroe Vogan PT, DPT 07/25/17 5:30 PM   [facility] [address] [place], [place], [address] Phone: [phone]   Fax:  [phone]  Name: RIKA DAUGHDRILL MRN: [id number] Date of Birth: [birth year]

## 2017-07-27 NOTE — Telephone Encounter (Signed)
This has been approved.

## 2017-07-28 ENCOUNTER — Encounter: Payer: Self-pay | Admitting: Physical Therapy

## 2017-07-28 ENCOUNTER — Ambulatory Visit: Payer: Medicare Other | Admitting: Physical Therapy

## 2017-07-28 DIAGNOSIS — M25511 Pain in right shoulder: Secondary | ICD-10-CM | POA: Diagnosis not present

## 2017-07-28 DIAGNOSIS — M542 Cervicalgia: Secondary | ICD-10-CM

## 2017-07-28 DIAGNOSIS — G8929 Other chronic pain: Secondary | ICD-10-CM | POA: Diagnosis not present

## 2017-07-28 NOTE — Therapy (Addendum)
[place], [place], [address] Phone: [phone]   Fax:  [phone]  Physical Therapy Treatment/Discharge Summary  Patient Details  Name: [name] MRN: [id number] Date of Birth: 05-30-1966 Referring Provider: Basil Dess, MD  Encounter Date: 07/28/2017      PT End of Session - 07/28/17 1547    Visit Number 2   Number of Visits 5   Date for PT Re-Evaluation 09/02/17   Authorization Type BCBS no visit limit   PT Start Time 1546   PT Stop Time 1628   PT Time Calculation (min) 42 min   Activity Tolerance Patient tolerated treatment well   Behavior During Therapy [facility] for tasks assessed/performed      Past Medical History:  Diagnosis Date  . Abscess    rectal  . Anxiety   . Arthritis    "back; hips" (11/14/2014)  . Bipolar disorder ([place])   . Chronic back pain    "all over"  . Chronic bronchitis ([place])    "I get it q yr" (11/14/2014)  . COPD (chronic obstructive pulmonary disease) ([place])   . Depression   . Dyspnea    W/ EXERTION   . Emphysema lung ([place])   . Endometriosis   . Fibromyalgia   . History of kidney stones   . ILD (interstitial lung disease) ([place]) 11/2013  . Migraine    "3 in the last 2 wks" (11/14/2014)  . Ovarian cyst   . Pulmonary infiltrates     Past Surgical History:  Procedure Laterality Date  . ANTERIOR CERVICAL DECOMP/DISCECTOMY FUSION  10/03/2015   Procedure: ANTERIOR CERVICAL DISCECTOMY  AND FUSION C5-6 WITH PLATE AND SCREWS, LOCAL AND ALLOGRAFT BONE GRAFT;  Surgeon: Jessy Oto, MD;  Location: [place];  Service: Orthopedics;;  . APPENDECTOMY  1993  . BREAST BIOPSY Left 2008  . BREAST LUMPECTOMY Left 2008   "benign"  . CYST EXCISION PERINEAL Left 01/2012   Archie Endo 02/15/2012  . EXAMINATION UNDER ANESTHESIA  02/15/2012   Procedure: EXAM UNDER ANESTHESIA;  Surgeon: Marcello Moores A. Cornett, MD;  Location: [place];  Service: General;  Laterality: Left;   Excision of perineal cyst  . FOOT SURGERY Left 08/17/2016   gastroc recession and plantar fascia release  . LAPAROSCOPIC CHOLECYSTECTOMY  08/2007  . LAPAROSCOPIC LYSIS OF ADHESIONS  06/2010   Archie Endo 06/18/2010  . LEFT OOPHORECTOMY Left 08/2013  . OVARIAN CYST REMOVAL  X 2-3  . SHOULDER CLOSED REDUCTION Left 10/03/2015   Procedure: CLOSED MANIPULATION SHOULDER CLOSED  AND INJECTION WITH LOCAL ANESTHETIC;  Surgeon: Jessy Oto, MD;  Location: [place];  Service: Orthopedics;  Laterality: Left;  . TRANSOBTURATOR SLING  09/2008   Transobturator mid urethral sling, cystoscopy, laparoscopy w/LOA/notes 03/04/2011  . TUBAL LIGATION  1993  . VAGINAL HYSTERECTOMY  1993  . VIDEO BRONCHOSCOPY Bilateral 02/15/2014   Procedure: VIDEO BRONCHOSCOPY WITH FLUORO;  Surgeon: Chesley Mires, MD;  Location: [facility];  Service: Cardiopulmonary;  Laterality: Bilateral;    There were no vitals filed for this visit.      Subjective Assessment - 07/28/17 1547    Subjective Has been doing stretches and feels a little better.    Patient Stated Goals driving, household chores- clean tub, sleep   Currently in Pain? Yes   Pain Score 6    Pain Location Shoulder   Pain Orientation Right   Pain Descriptors / Indicators Tightness  [place] Adult PT Treatment/Exercise - 07/28/17 0001      Neck Exercises: Machines for Strengthening   UBE (Upper Arm Bike) retro 3 min L1.2     Neck Exercises: Standing   Other Standing Exercises row green tband   Other Standing Exercises lat pull red tband     Neck Exercises: Supine   Neck Retraction Limitations supine into pillow   Shoulder Flexion 15 reps   Shoulder Flexion Limitations red tband   Shoulder Abduction Limitations horizontal abduction red tband- straight plane & diagonals   Other Supine Exercise protraction with abd pull on red tband     Modalities   Modalities Moist Heat     Moist Heat Therapy   Number Minutes Moist Heat 4  Minutes  2 min concurrent with education   Moist Heat Location Cervical     Manual Therapy   Manual therapy comments skilled palpation & monitoring during PTDN   Soft tissue mobilization R upper trap     Neck Exercises: Stretches   Upper Trapezius Stretch Limitations seated          Trigger Point Dry Needling - 07/28/17 1612    Consent Given? Yes   Education Handout Provided --  verbal education   Muscles Treated Upper Body Upper trapezius   Upper Trapezius Response Twitch reponse elicited;Palpable increased muscle length  Right              PT Education - 07/28/17 1627    Education provided Yes   Education Details TPDN & expected outcomes, HEP   Person(s) Educated Patient   Methods Explanation;Demonstration;Tactile cues;Verbal cues;Handout   Comprehension Verbalized understanding;Returned demonstration;Verbal cues required;Tactile cues required;Need further instruction             PT Long Term Goals - 07/25/17 1725      PT LONG TERM GOAL #1   Title Pt will be able to read without limitation by neck/shoulder pain   Baseline limited at eval   Time 5   Period Weeks   Status New   Target Date 09/02/17     PT LONG TERM GOAL #2   Title Pt will be able to complete household chores and be aware to take rest breaks to stretch/correct posture   Baseline hands off some chores to others at eval   Time 5   Period Weeks   Status New   Target Date 09/02/17     PT LONG TERM GOAL #3   Title pt will be able to drive without limitation by neck/shoulder pain   Baseline limited at eval   Time 5   Period Weeks   Status New   Target Date 09/02/17     PT LONG TERM GOAL #4   Title FOTO to 46% limitation to indicate significant improvement in functional ability   Baseline 56% limitation at eval   Time 5   Period Weeks   Status New   Target Date 09/02/17               Plan - 07/28/17 1616    Clinical Impression Statement Pt tolerated TPDN today which  resulted in a decrease in pain with cervical side bend and rotation. Fatigue in periscapular musculature with exercises.    PT Treatment/Interventions ADLs/Self Care Home Management;Cryotherapy;Electrical Stimulation;Iontophoresis '4mg'$ /ml Dexamethasone;Functional mobility training;Ultrasound;Traction;Moist Heat;Therapeutic activities;Therapeutic exercise;Neuromuscular re-education;Patient/family education;Passive range of motion;Scar mobilization;Manual techniques;Dry needling;Taping   PT Next Visit Plan TPDN response?   prone periscapular exercises   PT Home Exercise Plan upright/midline posture,  scapular retraction, upper trap stretch, ER red tband, rows green tband; supine horiz abd red tband & diagonals   Consulted and Agree with Plan of Care Patient      Patient will benefit from skilled therapeutic intervention in order to improve the following deficits and impairments:  Impaired UE functional use, Increased muscle spasms, Decreased activity tolerance, Pain, Improper body mechanics, Impaired flexibility, Decreased strength, Postural dysfunction  Visit Diagnosis: Cervicalgia  Chronic right shoulder pain     Problem List Patient Active Problem List   Diagnosis Date Noted  . Primary osteoarthritis of left knee 02/07/2017  . Postlaminectomy syndrome, cervical region 12/09/2015  . Cervical spondylosis with radiculopathy 10/04/2015  . Spondylosis, cervical, with myelopathy 10/03/2015  . Secondary adhesive capsulitis of left shoulder 10/03/2015  . Obesity 09/21/2015  . Nausea with vomiting   . SOB (shortness of breath)   . COPD with acute exacerbation ([place]) 11/14/2014  . Hypokalemia 11/14/2014  . Acute respiratory failure ([place]) 11/14/2014  . COPD exacerbation ([place]) 11/14/2014  . Abnormal EKG 11/14/2014  . COPD GOLD 0/ still smoking  06/17/2014  . Leg swelling 06/17/2014  . Fatigue 06/17/2014  . Cigarette smoker 01/20/2014  . Pulmonary infiltrate 01/11/2014  . MIXED INCONTINENCE  URGE AND STRESS 09/19/2007  . Bipolar disorder ([place]) 06/26/2007  . FIBROMYALGIA 06/26/2007  . INSOMNIA 06/26/2007   Yarima Penman C. Akeen Ledyard PT, DPT 07/28/17 4:30 PM   [place], [place], [address] Phone: [phone]   Fax:  [phone]  Name: [name] MRN: [id number] Date of Birth: Apr 16, 1966   PHYSICAL THERAPY DISCHARGE SUMMARY  Visits from Start of Care: 2  Current functional level related to goals / functional outcomes: See above   Remaining deficits: See above   Education / Equipment: Anatomy of condition, POC, HEP, exercise form/rationale  Plan: Patient agrees to discharge.  Patient goals were not met. Patient is being discharged due to not returning since the last visit.  ?????    Pt did not schedule any more appointments and is now out of POC, will require new referral to return to [facility]. Laker Thompson PT, DPT 09/07/17 4:53 PM

## 2017-08-12 DIAGNOSIS — F3181 Bipolar II disorder: Secondary | ICD-10-CM | POA: Diagnosis not present

## 2017-08-15 DIAGNOSIS — M797 Fibromyalgia: Secondary | ICD-10-CM | POA: Diagnosis not present

## 2017-08-15 DIAGNOSIS — K219 Gastro-esophageal reflux disease without esophagitis: Secondary | ICD-10-CM | POA: Diagnosis not present

## 2017-08-15 DIAGNOSIS — J449 Chronic obstructive pulmonary disease, unspecified: Secondary | ICD-10-CM | POA: Diagnosis not present

## 2017-08-15 DIAGNOSIS — F329 Major depressive disorder, single episode, unspecified: Secondary | ICD-10-CM | POA: Diagnosis not present

## 2017-08-22 ENCOUNTER — Ambulatory Visit (INDEPENDENT_AMBULATORY_CARE_PROVIDER_SITE_OTHER): Payer: Medicare Other | Admitting: Adult Health

## 2017-08-22 ENCOUNTER — Ambulatory Visit (INDEPENDENT_AMBULATORY_CARE_PROVIDER_SITE_OTHER)
Admission: RE | Admit: 2017-08-22 | Discharge: 2017-08-22 | Disposition: A | Discharge status: Home / Self Care | Payer: Medicare Other | Source: Ambulatory Visit | Attending: Adult Health | Admitting: Adult Health

## 2017-08-22 ENCOUNTER — Encounter: Payer: Self-pay | Admitting: Adult Health

## 2017-08-22 VITALS — BP 110/70 | HR 99 | Ht 62.0 in | Wt 203.0 lb

## 2017-08-22 DIAGNOSIS — J432 Centrilobular emphysema: Secondary | ICD-10-CM

## 2017-08-22 DIAGNOSIS — R0602 Shortness of breath: Secondary | ICD-10-CM | POA: Diagnosis not present

## 2017-08-22 DIAGNOSIS — J441 Chronic obstructive pulmonary disease with (acute) exacerbation: Secondary | ICD-10-CM | POA: Diagnosis not present

## 2017-08-22 DIAGNOSIS — R05 Cough: Secondary | ICD-10-CM | POA: Diagnosis not present

## 2017-08-22 NOTE — Progress Notes (Signed)
Patient seen in the office today and instructed on use of Symbicort 160.  Patient expressed understanding and demonstrated technique. [place] MasterJessica Sherissa Tenenbaum, CMA 08/22/17

## 2017-08-22 NOTE — Patient Instructions (Signed)
Begin Symbicort 2 puffs Twice daily  .  Great job not smoking .  Chest xray today  Follow  Up with Dr. Craige CottaSood  In 6 weeks with PFT and As needed   Please contact office for sooner follow up if symptoms do not improve or worsen or seek emergency care

## 2017-08-22 NOTE — Progress Notes (Signed)
_0  ID: [name], female    DOB: 14-Jun-1966, 51 y.o.   MRN: [id number]  Chief Complaint  Patient presents with  . Follow-up    follow up visit, taking proair 4xday when ordered 2xday    Referring provider: Thressa Sheller, MD  HPI: 51 yo female former smoker (quit 01/2017) seen in past for dyspnea with pulmonary infiltrate most likely from DIP and emphysema .   TEST  TESTS: A1AT 01/11/14 >> 154, MM CT chest 01/18/14 >> mosaic GGO b/l Rt > Lt PFT 01/25/14 >> FEV1 2.11 (73%), FEV1% 86, TLC 4.40 (87%), DLCO 70%, no BD PSG 02/08/14 >> AHI 0.4, SaO2 low 90%, PLMI 0.7, sleep onset 0 minutes. Labs 02/08/14 >> Hypersensitivity panel negative, RF < 10, ANA negative, ESR 19  Bronchoscopy 02/15/14 >> CD4:CD8 1:1, Tbx RUL reactive bronchial epithelial cells, BAL RUL histiocytes/neutrophils/reactive epithelial cells, WBC 23 (N16, L3, M81), Quant cx negative CT chest 03/20/14 >> persistent patchy b/l GGO ?DIP 04/12/14 Refer to thoracic surgery CT chest 722/15 >> mild centrilobular and paraseptal emphysema, much improved appearance of GGO, airtrapping CT chest 06/19/14 >> ATX Echo 07/29/14 >> EF 55 to 46%, grade 1 diastolic dysfx  56/81/2751 Follow up: COPD  Pt returns for follow up for COPD . She was last seen in office in 2016. Feels breathing is getting worse over last year. Gets winded easily with walking . Marland Kitchen Has some dry cough  And wheezing  Spirometry today shows moderate restriction with FVC 70%, FEV1 69%, ratio 79.  She tried ANORO in past but did not like the powder taste.  Denies chest pain , orthopnea, edema or fever.     Allergies  Allergen Reactions  . Cortisone Acetate Shortness Of Breath and Other (See Comments)    Makes patient feel as if she is choking.  . Vicodin [Hydrocodone-Acetaminophen] Itching    Immunization History  Administered Date(s) Administered  . Influenza Split 08/31/2015  . Influenza, High Dose Seasonal PF 08/15/2017  . Influenza,inj,Quad PF,6+  Mos 11/09/2013  . Pneumococcal Polysaccharide-23 11/09/2013  . Td 11/01/2002  . Tdap 01/16/2016    Past Medical History:  Diagnosis Date  . Abscess    rectal  . Anxiety   . Arthritis    "back; hips" (11/14/2014)  . Bipolar disorder ([place])   . Chronic back pain    "all over"  . Chronic bronchitis ([place])    "I get it q yr" (11/14/2014)  . COPD (chronic obstructive pulmonary disease) ([place])   . Depression   . Dyspnea    W/ EXERTION   . Emphysema lung ([place])   . Endometriosis   . Fibromyalgia   . History of kidney stones   . ILD (interstitial lung disease) ([place]) 11/2013  . Migraine    "3 in the last 2 wks" (11/14/2014)  . Ovarian cyst   . Pulmonary infiltrates     Tobacco History: History  Smoking Status  . Current Every Day Smoker  . Packs/day: 0.50  . Years: 32.00  . Types: Cigarettes  Smokeless Tobacco  . Never Used    Comment: currently 1/2 ppd/ STATES SHE QUIT OCT 2017   Ready to quit: Not Answered Counseling given: Not Answered   Outpatient Encounter Prescriptions as of 08/22/2017  Medication Sig  . albuterol (PROVENTIL HFA;VENTOLIN HFA) 108 (90 BASE) MCG/ACT inhaler Inhale 2 puffs into the lungs every 6 (six) hours as needed for wheezing or shortness of breath.  . ALPRAZolam (XANAX) 1 MG tablet Take 1 mg  by mouth 4 (four) times daily.   Marland Kitchen amoxicillin-clavulanate (AUGMENTIN) 875-125 MG tablet Take 1 tablet by mouth every 12 (twelve) hours.  Marland Kitchen amphetamine-dextroamphetamine (ADDERALL XR) 25 MG 24 hr capsule Take 1 capsule by mouth 2 (two) times daily.   . diclofenac (VOLTAREN) 75 MG EC tablet TAKE 1 TABLET(75 MG) BY MOUTH TWICE DAILY (Patient not taking: Reported on 07/25/2017)  . Diclofenac-Misoprostol 50-0.2 MG TBEC Take 1 tablet by mouth 2 (two) times daily after a meal.  . FLUoxetine (PROZAC) 40 MG capsule Take 40 mg by mouth 2 (two) times daily.   Marland Kitchen ibuprofen (ADVIL,MOTRIN) 800 MG tablet Take 1 tablet (800 mg total) by mouth 3 (three) times daily.  Marland Kitchen lamoTRIgine  (LAMICTAL) 200 MG tablet Take 200 mg by mouth 2 (two) times daily.   Marland Kitchen LATUDA 20 MG TABS tablet Take 20 mg by mouth every evening.  Marland Kitchen LYRICA 200 MG capsule Take 1 capsule by mouth 2 (two) times daily.  . ondansetron (ZOFRAN) 8 MG tablet Take 1 tablet (8 mg total) by mouth every 8 (eight) hours as needed for nausea or vomiting. (Patient not taking: Reported on 07/25/2017)  . oxyCODONE-acetaminophen (PERCOCET/ROXICET) 5-325 MG tablet Take 1 tablet by mouth every 8 (eight) hours as needed for severe pain. (Patient not taking: Reported on 07/25/2017)  . pantoprazole (PROTONIX) 20 MG tablet Take 1 tablet (20 mg total) by mouth daily. (Patient not taking: Reported on 07/25/2017)  . promethazine (PHENERGAN) 12.5 MG tablet Take 1 tablet by mouth every 8 (eight) hours as needed. Nausea  . QUEtiapine (SEROQUEL) 25 MG tablet Take 25 mg by mouth 3 (three) times daily.  . rizatriptan (MAXALT) 10 MG tablet Take 20 mg by mouth daily as needed for migraine. May repeat in 2 hours if needed  . zolpidem (AMBIEN) 10 MG tablet Take 10 mg by mouth at bedtime. May take 1-1 tablets at hs prn  . [DISCONTINUED] clonazePAM (KLONOPIN) 0.5 MG tablet Take 0.5 mg by mouth at bedtime.   . [DISCONTINUED] oxyCODONE (OXY IR/ROXICODONE) 5 MG immediate release tablet One tablet in the morning  and Two tablets in the Evening (Patient not taking: Reported on 07/25/2017)  . [DISCONTINUED] traMADol (ULTRAM) 50 MG tablet 1 tab po bid prn pain (Patient not taking: Reported on 07/25/2017)   No facility-administered encounter medications on file as of 08/22/2017.      Review of Systems  Constitutional:   No  weight loss, night sweats,  Fevers, chills, fatigue, or  lassitude.  HEENT:   No headaches,  Difficulty swallowing,  Tooth/dental problems, or  Sore throat,                No sneezing, itching, ear ache, nasal congestion, post nasal drip,   CV:  No chest pain,  Orthopnea, PND, swelling in lower extremities, anasarca, dizziness,  palpitations, syncope.   GI  No heartburn, indigestion, abdominal pain, nausea, vomiting, diarrhea, change in bowel habits, loss of appetite, bloody stools.   Resp:  .  No chest wall deformity  Skin: no rash or lesions.  GU: no dysuria, change in color of urine, no urgency or frequency.  No flank pain, no hematuria   MS:  No joint pain or swelling.  No decreased range of motion.  No back pain.    Physical Exam  BP 110/70 (BP Location: Left Arm, Cuff Size: Normal)   Pulse 99   Ht 5' 2" (1.575 m)   Wt 203 lb (92.1 kg)   SpO2 96%  BMI 37.13 kg/m   GEN: A/Ox3; pleasant , NAD, well nourished    HEENT:  [place]/AT,  EACs-clear, TMs-wnl, NOSE-clear, THROAT-clear, no lesions, no postnasal drip or exudate noted.   NECK:  Supple w/ fair ROM; no JVD; normal carotid impulses w/o bruits; no thyromegaly or nodules palpated; no lymphadenopathy.    RESP  Clear  P & A; w/o, wheezes/ rales/ or rhonchi. no accessory muscle use, no dullness to percussion  CARD:  RRR, no m/r/g, no peripheral edema, pulses intact, no cyanosis or clubbing.  GI:   Soft & nt; nml bowel sounds; no organomegaly or masses detected.   Musco: Warm bil, no deformities or joint swelling noted.   Neuro: alert, no focal deficits noted.    Skin: Warm, no lesions or rashes    Lab Results:   BMET  Imaging: No results found.   Assessment & Plan:   No problem-specific Assessment & Plan notes found for this encounter.     Rexene Edison, NP 08/22/2017

## 2017-08-23 MED ORDER — BUDESONIDE-FORMOTEROL FUMARATE 160-4.5 MCG/ACT IN AERO: 2.0000 | INHALATION_SPRAY | Freq: Two times a day (BID) | RESPIRATORY_TRACT | 0 refills | Status: DC

## 2017-08-23 MED ORDER — BUDESONIDE-FORMOTEROL FUMARATE 160-4.5 MCG/ACT IN AERO: 2.0000 | INHALATION_SPRAY | Freq: Two times a day (BID) | RESPIRATORY_TRACT | 3 refills | Status: DC

## 2017-08-23 NOTE — Assessment & Plan Note (Signed)
COPD - moderate restriction on Spriometry  Set up for PFT  Check cxr  Add Symbicort   Plan  Patient Instructions  Begin Symbicort 2 puffs Twice daily  .  Great job not smoking .  Chest xray today  Follow  Up with Dr. Craige CottaSood  In 6 weeks with PFT and As needed   Please contact office for sooner follow up if symptoms do not improve or worsen or seek emergency care

## 2017-08-30 NOTE — Progress Notes (Signed)
LMOM TCB x2

## 2017-09-06 DIAGNOSIS — R110 Nausea: Secondary | ICD-10-CM | POA: Diagnosis not present

## 2017-09-06 DIAGNOSIS — Z1211 Encounter for screening for malignant neoplasm of colon: Secondary | ICD-10-CM | POA: Diagnosis not present

## 2017-09-07 DIAGNOSIS — F429 Obsessive-compulsive disorder, unspecified: Secondary | ICD-10-CM | POA: Diagnosis not present

## 2017-09-07 DIAGNOSIS — F331 Major depressive disorder, recurrent, moderate: Secondary | ICD-10-CM | POA: Diagnosis not present

## 2017-09-08 ENCOUNTER — Telehealth: Payer: Self-pay | Admitting: Pulmonary Disease

## 2017-09-08 NOTE — Telephone Encounter (Signed)
Notes recorded by Julio SicksParrett, Tammy S, NP on 08/24/2017 at 9:09 AM EDT Chronic changes noted Must quit smoking .  Cont w/ ov recs Please contact office for sooner follow up if symptoms do not improve or worsen or seek emergency care    Pt advised of results and nothing further is needed.

## 2017-09-09 ENCOUNTER — Ambulatory Visit: Payer: Medicare Other | Admitting: Adult Health

## 2017-09-09 DIAGNOSIS — F3181 Bipolar II disorder: Secondary | ICD-10-CM | POA: Diagnosis not present

## 2017-09-15 ENCOUNTER — Ambulatory Visit (INDEPENDENT_AMBULATORY_CARE_PROVIDER_SITE_OTHER): Payer: Medicare Other | Admitting: Specialist

## 2017-10-05 ENCOUNTER — Other Ambulatory Visit: Payer: Self-pay | Admitting: Pulmonary Disease

## 2017-10-05 DIAGNOSIS — J439 Emphysema, unspecified: Secondary | ICD-10-CM

## 2017-10-05 DIAGNOSIS — B9681 Helicobacter pylori [H. pylori] as the cause of diseases classified elsewhere: Secondary | ICD-10-CM | POA: Diagnosis not present

## 2017-10-05 DIAGNOSIS — Z1211 Encounter for screening for malignant neoplasm of colon: Secondary | ICD-10-CM | POA: Diagnosis not present

## 2017-10-05 DIAGNOSIS — K228 Other specified diseases of esophagus: Secondary | ICD-10-CM | POA: Diagnosis not present

## 2017-10-05 DIAGNOSIS — K219 Gastro-esophageal reflux disease without esophagitis: Secondary | ICD-10-CM | POA: Diagnosis not present

## 2017-10-05 DIAGNOSIS — R110 Nausea: Secondary | ICD-10-CM | POA: Diagnosis not present

## 2017-10-05 DIAGNOSIS — K2930 Chronic superficial gastritis without bleeding: Secondary | ICD-10-CM | POA: Diagnosis not present

## 2017-10-05 DIAGNOSIS — K210 Gastro-esophageal reflux disease with esophagitis: Secondary | ICD-10-CM | POA: Diagnosis not present

## 2017-10-06 ENCOUNTER — Ambulatory Visit (INDEPENDENT_AMBULATORY_CARE_PROVIDER_SITE_OTHER): Payer: Medicare Other | Admitting: Pulmonary Disease

## 2017-10-06 ENCOUNTER — Encounter: Payer: Self-pay | Admitting: Pulmonary Disease

## 2017-10-06 VITALS — BP 132/80 | HR 81 | Ht 66.0 in | Wt 200.0 lb

## 2017-10-06 DIAGNOSIS — J849 Interstitial pulmonary disease, unspecified: Secondary | ICD-10-CM

## 2017-10-06 DIAGNOSIS — J449 Chronic obstructive pulmonary disease, unspecified: Secondary | ICD-10-CM | POA: Diagnosis not present

## 2017-10-06 DIAGNOSIS — J439 Emphysema, unspecified: Secondary | ICD-10-CM

## 2017-10-06 DIAGNOSIS — Z23 Encounter for immunization: Secondary | ICD-10-CM

## 2017-10-06 LAB — PULMONARY FUNCTION TEST
DL/VA % pred: 84 %
DL/VA: 4.16 ml/min/mmHg/L
DLCO cor % pred: 64 %
DLCO cor: 16.43 ml/min/mmHg
DLCO unc % pred: 65 %
DLCO unc: 16.82 ml/min/mmHg
FEF 25-75 Post: 2.73 L/sec
FEF 25-75 Pre: 3.21 L/sec
FEF2575-%Change-Post: -14 %
FEF2575-%Pred-Post: 97 %
FEF2575-%Pred-Pre: 114 %
FEV1-%Change-Post: -2 %
FEV1-%Pred-Post: 69 %
FEV1-%Pred-Pre: 71 %
FEV1-Post: 2.01 L
FEV1-Pre: 2.07 L
FEV1FVC-%Change-Post: 4 %
FEV1FVC-%Pred-Pre: 107 %
FEV6-%Change-Post: -7 %
FEV6-%Pred-Post: 62 %
FEV6-%Pred-Pre: 66 %
FEV6-Post: 2.21 L
FEV6-Pre: 2.37 L
FEV6FVC-%Change-Post: 1 %
FEV6FVC-%Pred-Post: 103 %
FEV6FVC-%Pred-Pre: 101 %
FVC-%Change-Post: -6 %
FVC-%Pred-Post: 61 %
FVC-%Pred-Pre: 65 %
FVC-Post: 2.24 L
FVC-Pre: 2.41 L
Post FEV1/FVC ratio: 90 %
Post FEV6/FVC ratio: 100 %
Pre FEV1/FVC ratio: 86 %
Pre FEV6/FVC Ratio: 99 %
RV % pred: 137 %
RV: 2.54 L
TLC % pred: 93 %
TLC: 4.85 L

## 2017-10-06 MED ORDER — FLUTICASONE-UMECLIDIN-VILANT 100-62.5-25 MCG/INH IN AEPB: 1.0000 | INHALATION_SPRAY | Freq: Every day | RESPIRATORY_TRACT | 5 refills | Status: DC

## 2017-10-06 MED ORDER — FLUTICASONE-UMECLIDIN-VILANT 100-62.5-25 MCG/INH IN AEPB: 1.0000 | INHALATION_SPRAY | Freq: Every morning | RESPIRATORY_TRACT | 0 refills | Status: DC

## 2017-10-06 NOTE — Patient Instructions (Signed)
Trelegy one puff daily, and rinse mouth after each use  Don't use symbicort while on trelegy  Pneumonia 13 shot today  Will schedule high resolution CT chest  Follow up in 3 months

## 2017-10-06 NOTE — Progress Notes (Signed)
Current Outpatient Medications on File Prior to Visit  Medication Sig  . albuterol (PROVENTIL HFA;VENTOLIN HFA) 108 (90 BASE) MCG/ACT inhaler Inhale 2 puffs into the lungs every 6 (six) hours as needed for wheezing or shortness of breath.  . ALPRAZolam (XANAX) 1 MG tablet Take 1 mg by mouth 4 (four) times daily.   Marland Kitchen amphetamine-dextroamphetamine (ADDERALL XR) 25 MG 24 hr capsule Take 1 capsule by mouth 2 (two) times daily.   . budesonide-formoterol (SYMBICORT) 160-4.5 MCG/ACT inhaler Inhale 2 puffs into the lungs 2 (two) times daily.  . budesonide-formoterol (SYMBICORT) 160-4.5 MCG/ACT inhaler Inhale 2 puffs into the lungs 2 (two) times daily.  . Diclofenac-Misoprostol 50-0.2 MG TBEC Take 1 tablet by mouth 2 (two) times daily after a meal.  . FLUoxetine (PROZAC) 40 MG capsule Take 40 mg by mouth 2 (two) times daily.   Marland Kitchen ibuprofen (ADVIL,MOTRIN) 800 MG tablet Take 1 tablet (800 mg total) by mouth 3 (three) times daily.  Marland Kitchen lamoTRIgine (LAMICTAL) 200 MG tablet Take 200 mg by mouth 2 (two) times daily.   Marland Kitchen LYRICA 200 MG capsule Take 1 capsule by mouth 2 (two) times daily.  . ondansetron (ZOFRAN) 8 MG tablet Take 1 tablet (8 mg total) by mouth every 8 (eight) hours as needed for nausea or vomiting.  . pantoprazole (PROTONIX) 20 MG tablet Take 1 tablet (20 mg total) by mouth daily.  . rizatriptan (MAXALT) 10 MG tablet Take 20 mg by mouth daily as needed for migraine. May repeat in 2 hours if needed  . zolpidem (AMBIEN) 10 MG tablet Take 10 mg by mouth at bedtime. May take 1-1 tablets at hs prn   No current facility-administered medications on file prior to visit.     Chief Complaint  Patient presents with  . Follow-up    Pt had PFT prior to visit today. Pt had endoscopy and colonscopy yesterday 10/05/2017; stated that they found inflammation in pts lungs. Pt having hard time breathing, chest tightness, SOB with exertion with productive cough- green mucus for last 3 weeks.    Pulmonary tests A1AT  01/11/14 >> 154, MM PFT 01/25/14 >> FEV1 2.11 (73%), FEV1% 86, TLC 4.40 (87%), DLCO 70%, no BD Labs 02/08/14 >> Hypersensitivity panel negative, RF < 10, ANA negative, ESR 19  Bronchoscopy 02/15/14 >> CD4:CD8 1:1, Tbx RUL reactive bronchial epithelial cells, BAL RUL histiocytes/neutrophils/reactive epithelial cells, WBC 23 (N16, L3, M81), Quant cx negative 04/12/14 Refer to thoracic surgery PFT 10/06/17 >> FEV1 2.07 (71%), FEV1% 86, TLC 4.85 (93%), DLCO 65%, no BD  Chest imaging CT chest 01/18/14 >> mosaic GGO b/l Rt > Lt CT chest 03/20/14 >> persistent patchy b/l GGO ?DIP CT chest 722/15 >> mild centrilobular and paraseptal emphysema, much improved appearance of GGO, airtrapping CT chest 06/19/14 >> ATX  Cardiac tests Echo 07/29/14 >> EF 55 to 42%, grade 1 diastolic dysfx  Sleep tests PSG 02/08/14 >> AHI 0.4, SaO2 low 90%, PLMI 0.7, sleep onset 0 minutes.  Past medical history Depression, Bipolar, Anxiety, Chronic back pain, Headaches, Fibromyaglia, Endometriosis, Nephrolithiasis  Past surgical history, Family history, Social history, Allergies reviewed  Vital signs BP 132/80 (BP Location: Left Arm, Cuff Size: Normal)   Pulse 81   Ht _0  (1.676 m)   Wt 200 lb (90.7 kg)   SpO2 97%   BMI 32.28 kg/m    History of Present Illness: [name] is a 51 y.o. female smoker with dyspnea with pulmonary infiltrates likely from DIP, and emphysema.  I last  saw her in 2015.  She was most recently seen by Rexene Edison in October 2018 with an exacerbation.  CXR from that time showed changes of COPD and interstitial lung disease.  She has PFT today.  This showed mixed obstruction and restriction, air trapping, and mild diffusion defect.  She still has cough with clear to green sputum.  She gets winded with exertion.  She is not having wheeze, chest pain, fever, sinus congestion, sore throat, skin rash, abdominal pain, hemoptysis, or leg swelling.  She quit smoking in April 2018.  She used  nicotine gum   Physical Exam:  General - pleasant Eyes - pupils reactive, wears glasses ENT - no sinus tenderness, no oral exudate, no LAN, raspy voice Cardiac - regular, no murmur Chest - coarse breath sounds b/l, no wheeze Abd - soft, non tender Ext - no edema Skin - no rashes Neuro - normal strength Psych - normal mood   Assessment/Plan:  Hx of ILD likely from DIP. - she has quit smoking - recent chest xray showed interstitial changes and PFT today shows restriction and diffusion defect - will arrange for high resolution CT chest   COPD with emphysema and chronic bronchitis. - will have switch to trelegy in place of symbicort - continue albuterol   Patient Instructions  Trelegy one puff daily, and rinse mouth after each use  Don't use symbicort while on trelegy  Pneumonia 13 shot today  Will schedule high resolution CT chest  Follow up in 3 months   Chesley Mires, MD [facility] Pager:  [phone] 10/06/2017, 10:24 AM

## 2017-10-06 NOTE — Progress Notes (Signed)
PFT completed today 10/06/17.  

## 2017-10-06 NOTE — Addendum Note (Signed)
Addended by: Cornell BarmanWHITTAKER, KELLI M on: 10/06/2017 11:01 AM   Modules accepted: Orders

## 2017-10-06 NOTE — Addendum Note (Signed)
Addended by: Cornell BarmanWHITTAKER, KELLI M on: 10/06/2017 10:34 AM   Modules accepted: Orders

## 2017-10-14 DIAGNOSIS — K2930 Chronic superficial gastritis without bleeding: Secondary | ICD-10-CM | POA: Diagnosis not present

## 2017-10-14 DIAGNOSIS — B9681 Helicobacter pylori [H. pylori] as the cause of diseases classified elsewhere: Secondary | ICD-10-CM | POA: Diagnosis not present

## 2017-10-14 DIAGNOSIS — K210 Gastro-esophageal reflux disease with esophagitis: Secondary | ICD-10-CM | POA: Diagnosis not present

## 2017-10-14 DIAGNOSIS — Z1211 Encounter for screening for malignant neoplasm of colon: Secondary | ICD-10-CM | POA: Diagnosis not present

## 2017-10-15 ENCOUNTER — Other Ambulatory Visit (INDEPENDENT_AMBULATORY_CARE_PROVIDER_SITE_OTHER): Payer: Self-pay | Admitting: Specialist

## 2017-10-17 NOTE — Telephone Encounter (Signed)
Diclofenac/Misprostol refill request.

## 2017-10-18 ENCOUNTER — Ambulatory Visit (INDEPENDENT_AMBULATORY_CARE_PROVIDER_SITE_OTHER)
Admission: RE | Admit: 2017-10-18 | Discharge: 2017-10-18 | Disposition: A | Discharge status: Home / Self Care | Payer: Medicare Other | Source: Ambulatory Visit | Attending: Pulmonary Disease | Admitting: Pulmonary Disease

## 2017-10-18 DIAGNOSIS — J849 Interstitial pulmonary disease, unspecified: Secondary | ICD-10-CM | POA: Diagnosis not present

## 2017-10-18 DIAGNOSIS — J439 Emphysema, unspecified: Secondary | ICD-10-CM | POA: Diagnosis not present

## 2017-10-19 DIAGNOSIS — F3181 Bipolar II disorder: Secondary | ICD-10-CM | POA: Diagnosis not present

## 2017-10-20 DIAGNOSIS — F3181 Bipolar II disorder: Secondary | ICD-10-CM | POA: Diagnosis not present

## 2017-11-02 DIAGNOSIS — E559 Vitamin D deficiency, unspecified: Secondary | ICD-10-CM | POA: Diagnosis not present

## 2017-11-02 DIAGNOSIS — N390 Urinary tract infection, site not specified: Secondary | ICD-10-CM | POA: Diagnosis not present

## 2017-11-02 DIAGNOSIS — E785 Hyperlipidemia, unspecified: Secondary | ICD-10-CM | POA: Diagnosis not present

## 2017-11-02 DIAGNOSIS — Z0000 Encounter for general adult medical examination without abnormal findings: Secondary | ICD-10-CM | POA: Diagnosis not present

## 2017-11-11 ENCOUNTER — Telehealth: Payer: Self-pay | Admitting: Pulmonary Disease

## 2017-11-11 NOTE — Telephone Encounter (Signed)
Left voice mail on machine for patient to return phone call back regarding results. X1  

## 2017-11-11 NOTE — Telephone Encounter (Signed)
HRCT chest >> bronchial thickening, centrilobular/paraseptal emphysema, atherosclerosis, air trapping    Please let her know her CT chest shows changes of COPD with emphysema.  She should continue using trelegy.

## 2017-11-14 NOTE — Telephone Encounter (Signed)
Called and spoke with patient today regarding results and recommendations.  Informed the patient of results today.  The patient verbalized understanding and denied any questions or concerns at this time. Nothing further needed. 

## 2017-11-16 DIAGNOSIS — F3181 Bipolar II disorder: Secondary | ICD-10-CM | POA: Diagnosis not present

## 2017-12-16 DIAGNOSIS — F330 Major depressive disorder, recurrent, mild: Secondary | ICD-10-CM | POA: Diagnosis not present

## 2017-12-16 DIAGNOSIS — F3181 Bipolar II disorder: Secondary | ICD-10-CM | POA: Diagnosis not present

## 2017-12-29 ENCOUNTER — Ambulatory Visit (INDEPENDENT_AMBULATORY_CARE_PROVIDER_SITE_OTHER): Payer: Medicare Other | Admitting: Specialist

## 2017-12-29 ENCOUNTER — Ambulatory Visit (INDEPENDENT_AMBULATORY_CARE_PROVIDER_SITE_OTHER): Payer: Medicare Other

## 2017-12-29 ENCOUNTER — Encounter (INDEPENDENT_AMBULATORY_CARE_PROVIDER_SITE_OTHER): Payer: Self-pay | Admitting: Specialist

## 2017-12-29 VITALS — BP 96/64 | HR 76 | Ht 66.0 in | Wt 197.0 lb

## 2017-12-29 DIAGNOSIS — G8929 Other chronic pain: Secondary | ICD-10-CM | POA: Diagnosis not present

## 2017-12-29 DIAGNOSIS — M5441 Lumbago with sciatica, right side: Secondary | ICD-10-CM

## 2017-12-29 MED ORDER — METHYLPREDNISOLONE 4 MG PO TBPK: ORAL_TABLET | ORAL | 0 refills | Status: DC

## 2017-12-29 MED ORDER — ACETAMINOPHEN-CODEINE #4 300-60 MG PO TABS: 1.0000 | ORAL_TABLET | ORAL | 0 refills | Status: DC | PRN

## 2017-12-29 NOTE — Patient Instructions (Addendum)
Avoid frequent bending and stooping  No lifting greater than 10 lbs. May use ice or moist heat for pain. Weight loss is of benefit. Tylenol #3 for discomfort. Do not take muscle relaxers, sedatives and sleep aids within 1-2 hours of each other.  See your primary care physician for this worsening cough.

## 2017-12-29 NOTE — Progress Notes (Signed)
Office Visit Note   Patient: [name]           Date of Birth: [birth year]           MRN: [id number] Visit Date: 12/29/2017              Requested by: Thayer HeadingsMackenzie, Brian, MD [address], [address] [place], [place] [id number] PCP: Pearson GrippeKim, Mujtaba Bollig, MD   Assessment & Plan: Visit Diagnoses:  1. Chronic right-sided low back pain with right-sided sciatica     Plan:Avoid frequent bending and stooping  No lifting greater than 10 lbs. May use ice or moist heat for pain. Weight loss is of benefit. Tylenol #3 for discomfort. Do not take muscle relaxers, sedatives and sleep aids within 1-2 hours of each other. See your primary care physician for this worsening cough. Follow-Up Instructions: No Follow-up on file.   Orders:  Orders Placed This Encounter  Procedures  . XR Lumbar Spine 2-3 Views   No orders of the defined types were placed in this encounter.     Procedures: No procedures performed   Clinical Data: No additional findings.   Subjective: Chief Complaint  Patient presents with  . Lower Back - Pain    52 year old female with worsening back pain over the past 2 weeks, she is in a "bad way". My neck did not hurt me as much as this does. No bowel or bladder difficulty. She has difficulty initiating standing and walking. Has a hard time getting up and can not walk very far. Grand child wants her to visit in [place] but she relates that she can not ride the 3 1/2 distance. Sitting for the trip to the office 5-10 minutes is uncomfortable. She is sure what she has done.  She has difficulty using stairs even just the 2 stairs at home. Her husband and son Are helping her. Her husband is helping around the house and is getting ready to assist her at home and take some time off. She takes, alleve, tylenol and BC powder. Ice and heat alternating. She can not lift any thing.    Review of Systems  Constitutional: Negative.   HENT: Negative.   Eyes: Negative.     Respiratory: Negative.   Cardiovascular: Negative.   Gastrointestinal: Negative.   Endocrine: Negative.   Genitourinary: Negative.   Musculoskeletal: Negative.   Skin: Negative.   Allergic/Immunologic: Negative.   Neurological: Negative.   Hematological: Negative.   Psychiatric/Behavioral: Negative.      Objective: Vital Signs: BP 96/64   Pulse 76   Ht 5\' 6"  (1.676 m)   Wt 197 lb (89.4 kg)   BMI 31.80 kg/m   Physical Exam  Constitutional: She is oriented to person, place, and time. She appears well-developed and well-nourished.  HENT:  Head: Normocephalic and atraumatic.  Eyes: EOM are normal. Pupils are equal, round, and reactive to light.  Neck: Normal range of motion. Neck supple.  Pulmonary/Chest: Effort normal and breath sounds normal.  Abdominal: Soft. Bowel sounds are normal.  Neurological: She is alert and oriented to person, place, and time.  Skin: Skin is warm and dry.  Psychiatric: She has a normal mood and affect. Her behavior is normal. Judgment and thought content normal.    Back Exam   Tenderness  The patient is experiencing tenderness in the lumbar.  Range of Motion  Extension: abnormal  Flexion: abnormal  Lateral bend right: abnormal  Lateral bend left: abnormal  Rotation right: abnormal  Rotation left: abnormal   Muscle Strength  Right Quadriceps:  5/5  Left Quadriceps:  5/5  Right Hamstrings:  5/5  Left Hamstrings:  5/5   Reflexes  Patellar: 0/4 Achilles: 0/4 Babinski's sign: normal   Other  Toe walk: normal Heel walk: normal Sensation: normal Gait: normal  Erythema: no back redness      Specialty Comments:  No specialty comments available.  Imaging: No results found.   PMFS History: Patient Active Problem List   Diagnosis Date Noted  . Spondylosis, cervical, with myelopathy 10/03/2015    Priority: High  . Primary osteoarthritis of left knee 02/07/2017  . Postlaminectomy syndrome, cervical region 12/09/2015  .  Cervical spondylosis with radiculopathy 10/04/2015  . Secondary adhesive capsulitis of left shoulder 10/03/2015  . Obesity 09/21/2015  . Nausea with vomiting   . SOB (shortness of breath)   . COPD with acute exacerbation (HCC) 11/14/2014  . Hypokalemia 11/14/2014  . Acute respiratory failure (HCC) 11/14/2014  . COPD exacerbation (HCC) 11/14/2014  . Abnormal EKG 11/14/2014  . COPD GOLD 0/ still smoking  06/17/2014  . Leg swelling 06/17/2014  . Fatigue 06/17/2014  . Cigarette smoker 01/20/2014  . Pulmonary infiltrate 01/11/2014  . MIXED INCONTINENCE URGE AND STRESS 09/19/2007  . Bipolar disorder (HCC) 06/26/2007  . FIBROMYALGIA 06/26/2007  . INSOMNIA 06/26/2007   Past Medical History:  Diagnosis Date  . Abscess    rectal  . Anxiety   . Arthritis    "back; hips" (11/14/2014)  . Bipolar disorder (HCC)   . Chronic back pain    "all over"  . Chronic bronchitis (HCC)    "I get it q yr" (11/14/2014)  . COPD (chronic obstructive pulmonary disease) (HCC)   . Depression   . Dyspnea    W/ EXERTION   . Emphysema lung (HCC)   . Endometriosis   . Fibromyalgia   . History of kidney stones   . ILD (interstitial lung disease) (HCC) 11/2013  . Migraine    "3 in the last 2 wks" (11/14/2014)  . Ovarian cyst   . Pulmonary infiltrates     Family History  Problem Relation Age of Onset  . Cancer Father        lung  . COPD Father   . Early death Father   . Hypertension Sister   . Cancer Maternal Aunt        LUNG CANCER  . Other Neg Hx     Past Surgical History:  Procedure Laterality Date  . ANTERIOR CERVICAL DECOMP/DISCECTOMY FUSION  10/03/2015   Procedure: ANTERIOR CERVICAL DISCECTOMY  AND FUSION C5-6 WITH PLATE AND SCREWS, LOCAL AND ALLOGRAFT BONE GRAFT;  Surgeon: Kerrin Champagne, MD;  Location: [facility] OR;  Service: Orthopedics;;  . APPENDECTOMY  1993  . BREAST BIOPSY Left 2008  . BREAST LUMPECTOMY Left 2008   "benign"  . CYST EXCISION PERINEAL Left 01/2012   Hattie Perch 02/15/2012  .  EXAMINATION UNDER ANESTHESIA  02/15/2012   Procedure: EXAM UNDER ANESTHESIA;  Surgeon: Maisie Fus A. Cornett, MD;  Location: [facility];  Service: General;  Laterality: Left;  Excision of perineal cyst  . FOOT SURGERY Left 08/17/2016   gastroc recession and plantar fascia release  . LAPAROSCOPIC CHOLECYSTECTOMY  08/2007  . LAPAROSCOPIC LYSIS OF ADHESIONS  06/2010   Hattie Perch 06/18/2010  . LEFT OOPHORECTOMY Left 08/2013  . OVARIAN CYST REMOVAL  X 2-3  . SHOULDER CLOSED REDUCTION Left 10/03/2015   Procedure: CLOSED MANIPULATION SHOULDER CLOSED  AND INJECTION  WITH LOCAL ANESTHETIC;  Surgeon: Kerrin Champagne, MD;  Location[facility];  Service: Orthopedics;  Laterality: Left;  . TRANSOBTURATOR SLING  09/2008   Transobturator mid urethral sling, cystoscopy, laparoscopy w/LOA/notes 03/04/2011  . TUBAL LIGATION  1993  . VAGINAL HYSTERECTOMY  1993  . VIDEO BRONCHOSCOPY Bilateral 02/15/2014   Procedure: VIDEO BRONCHOSCOPY WITH FLUORO;  Surgeon: Coralyn Helling, MD;  Location: [facility];  Service: Cardiopulmonary;  Laterality: Bilateral;   Social History   Occupational History  . Not on file  Tobacco Use  . Smoking status: Former Smoker    Packs/day: 0.50    Years: 32.00    Pack years: 16.00    Types: Cigarettes    Last attempt to quit: 02/20/2017    Years since quitting: 0.8  . Smokeless tobacco: Never Used  . Tobacco comment: currently 1/2 ppd/ STATES SHE QUIT OCT 2017  Substance and Sexual Activity  . Alcohol use: No    Alcohol/week: 0.0 oz  . Drug use: No  . Sexual activity: Not Currently    Birth control/protection: Surgical

## 2018-01-13 DIAGNOSIS — F3181 Bipolar II disorder: Secondary | ICD-10-CM | POA: Diagnosis not present

## 2018-01-19 ENCOUNTER — Ambulatory Visit (INDEPENDENT_AMBULATORY_CARE_PROVIDER_SITE_OTHER): Payer: Medicare Other | Admitting: Surgery

## 2018-02-16 DIAGNOSIS — F3181 Bipolar II disorder: Secondary | ICD-10-CM | POA: Diagnosis not present

## 2018-02-16 DIAGNOSIS — F29 Unspecified psychosis not due to a substance or known physiological condition: Secondary | ICD-10-CM | POA: Diagnosis not present

## 2018-02-27 DIAGNOSIS — E785 Hyperlipidemia, unspecified: Secondary | ICD-10-CM | POA: Diagnosis not present

## 2018-02-27 DIAGNOSIS — J449 Chronic obstructive pulmonary disease, unspecified: Secondary | ICD-10-CM | POA: Diagnosis not present

## 2018-02-27 DIAGNOSIS — M797 Fibromyalgia: Secondary | ICD-10-CM | POA: Diagnosis not present

## 2018-03-16 DIAGNOSIS — F3181 Bipolar II disorder: Secondary | ICD-10-CM | POA: Diagnosis not present

## 2018-04-27 ENCOUNTER — Telehealth (INDEPENDENT_AMBULATORY_CARE_PROVIDER_SITE_OTHER): Payer: Self-pay | Admitting: Specialist

## 2018-04-27 ENCOUNTER — Other Ambulatory Visit (INDEPENDENT_AMBULATORY_CARE_PROVIDER_SITE_OTHER): Payer: Self-pay | Admitting: Specialist

## 2018-04-27 MED ORDER — ACETAMINOPHEN-CODEINE #4 300-60 MG PO TABS: 1.0000 | ORAL_TABLET | ORAL | 0 refills | Status: DC | PRN

## 2018-04-27 NOTE — Telephone Encounter (Signed)
I called rx into pharm, I called and advised her that her rx was called in

## 2018-04-27 NOTE — Telephone Encounter (Signed)
Patient said she fell and hurt her neck and shoulder, she wants to see Dr. Otelia SergeantNitka only so she asks that we place her on cancellation list if something opens before 06/15/18.   Also, in the mean time she would like a prescription of tramadol called in.  Patients # [phone]

## 2018-04-27 NOTE — Telephone Encounter (Signed)
Patient said she fell and hurt her neck and shoulder, she wants to see Dr. Otelia SergeantNitka only so she asks that we place her on cancellation list if something opens before 06/15/18.   Also, in the mean time she would like a prescription of tramadol called in.

## 2018-04-27 NOTE — Telephone Encounter (Signed)
Tylenol #4 for pain is written, she is on several antidepressants and is not a good candidate for tramadol, increases risk of a seratonin syndrome due to similar affect on brain nerve transmitters.

## 2018-05-02 ENCOUNTER — Ambulatory Visit (INDEPENDENT_AMBULATORY_CARE_PROVIDER_SITE_OTHER): Payer: Medicare Other | Admitting: Specialist

## 2018-05-03 DIAGNOSIS — F901 Attention-deficit hyperactivity disorder, predominantly hyperactive type: Secondary | ICD-10-CM | POA: Diagnosis not present

## 2018-05-03 DIAGNOSIS — F3181 Bipolar II disorder: Secondary | ICD-10-CM | POA: Diagnosis not present

## 2018-05-10 DIAGNOSIS — F3181 Bipolar II disorder: Secondary | ICD-10-CM | POA: Diagnosis not present

## 2018-05-18 ENCOUNTER — Ambulatory Visit (INDEPENDENT_AMBULATORY_CARE_PROVIDER_SITE_OTHER): Payer: Medicare Other | Admitting: Specialist

## 2018-05-18 ENCOUNTER — Ambulatory Visit (INDEPENDENT_AMBULATORY_CARE_PROVIDER_SITE_OTHER): Payer: Self-pay

## 2018-05-18 ENCOUNTER — Encounter (INDEPENDENT_AMBULATORY_CARE_PROVIDER_SITE_OTHER): Payer: Self-pay | Admitting: Specialist

## 2018-05-18 VITALS — BP 112/73 | HR 81 | Ht 66.0 in | Wt 180.0 lb

## 2018-05-18 DIAGNOSIS — S139XXA Sprain of joints and ligaments of unspecified parts of neck, initial encounter: Secondary | ICD-10-CM

## 2018-05-18 DIAGNOSIS — M542 Cervicalgia: Secondary | ICD-10-CM

## 2018-05-18 DIAGNOSIS — S46911A Strain of unspecified muscle, fascia and tendon at shoulder and upper arm level, right arm, initial encounter: Secondary | ICD-10-CM

## 2018-05-18 DIAGNOSIS — M25511 Pain in right shoulder: Secondary | ICD-10-CM | POA: Diagnosis not present

## 2018-05-18 DIAGNOSIS — M4322 Fusion of spine, cervical region: Secondary | ICD-10-CM

## 2018-05-18 DIAGNOSIS — Z9889 Other specified postprocedural states: Secondary | ICD-10-CM

## 2018-05-18 MED ORDER — ACETAMINOPHEN-CODEINE #4 300-60 MG PO TABS: 1.0000 | ORAL_TABLET | ORAL | 0 refills | Status: DC | PRN

## 2018-05-18 NOTE — Patient Instructions (Signed)
Avoid overhead lifting and overhead use of the arms. Do not lift greater than 10 lbs. Tylenol ES one every 6-8 hours for pain and inflamation. Avoid overhead lifting and overhead use of the arms. Do not lift greater than 5 lbs. Adjust head rest in vehicle to prevent hyperextension if rear ended. Take extra precautions to avoid falling.

## 2018-05-18 NOTE — Progress Notes (Signed)
Office Visit Note   Patient: [name]           Date of Birth: 1966/02/26           MRN: [id number] Visit Date: 05/18/2018              Requested by: Pearson GrippeKim, James, MD [address] [place], [place] [id number] PCP: Pearson GrippeKim, James, MD   Assessment & Plan: Visit Diagnoses:  1. Neck pain   2. Right shoulder pain, unspecified chronicity   3. Neck sprain, initial encounter   4. Right shoulder strain, initial encounter     Plan: Avoid overhead lifting and overhead use of the arms. Do not lift greater than 10 lbs. Tylenol ES one every 6-8 hours for pain and inflamation. Avoid overhead lifting and overhead use of the arms. Do not lift greater than 5 lbs. Adjust head rest in vehicle to prevent hyperextension if rear ended. Take extra precautions to avoid falling.  Follow-Up Instructions: No follow-ups on file.   Orders:  Orders Placed This Encounter  Procedures  . XR Cervical Spine 2 or 3 views  . XR Shoulder Right   Meds ordered this encounter  Medications  . acetaminophen-codeine (TYLENOL #4) 300-60 MG tablet    Sig: Take 1 tablet by mouth every 4 (four) hours as needed for moderate pain.    Dispense:  30 tablet    Refill:  0      Procedures: No procedures performed   Clinical Data: No additional findings.   Subjective: Chief Complaint  Patient presents with  . Neck - Injury    Had a fall in early May  . Right Shoulder - Injury    Had a fall in early May    HPI  Review of Systems   Objective: Vital Signs: BP 112/73 (BP Location: Left Arm, Patient Position: Sitting)   Pulse 81   Ht 5\' 6"  (1.676 m)   Wt 180 lb (81.6 kg)   BMI 29.05 kg/m   Physical Exam  Constitutional: She is oriented to person, place, and time. She appears well-developed and well-nourished.  HENT:  Head: Normocephalic and atraumatic.  Eyes: Pupils are equal, round, and reactive to light. EOM are normal.  Neck: Normal range of motion. Neck supple.    Pulmonary/Chest: Effort normal and breath sounds normal.  Abdominal: Soft. Bowel sounds are normal.  Neurological: She is alert and oriented to person, place, and time.  Skin: Skin is warm and dry.  Psychiatric: She has a normal mood and affect. Her behavior is normal. Judgment and thought content normal.    Back Exam   Tenderness  The patient is experiencing tenderness in the cervical.  Range of Motion  Extension: abnormal  Flexion: abnormal  Lateral bend right: normal  Lateral bend left: normal  Rotation right: normal  Rotation left: normal   Muscle Strength  Right Quadriceps:  5/5  Left Quadriceps:  5/5  Right Hamstrings:  5/5  Left Hamstrings:  5/5   Tests  Straight leg raise right: negative Straight leg raise left: negative  Reflexes  Patellar: normal Achilles: normal Babinski's sign: normal   Other  Toe walk: normal Heel walk: normal Sensation: normal Gait: normal  Erythema: no back redness Scars: absent   Right Shoulder Exam  Right shoulder exam is normal.  Muscle Strength  Abduction: 5/5  Internal rotation: 5/5  External rotation: 5/5  Supraspinatus: 5/5  Subscapularis: 5/5  Biceps: 5/5   Tests  Apprehension: positive Juanetta GoslingHawkins  test: positive Cross arm: positive Impingement: positive Drop arm: positive Sulcus: present  Other  Erythema: absent Scars: absent Sensation: normal Pulse: present      Specialty Comments:  No specialty comments available.  Imaging: Xr Cervical Spine 2 Or 3 Views  Result Date: 05/18/2018 Cervical spine AP and lateral flexion and extension radiographs Fusion at the C5-6 with plate and screws there is no instability, minimal anterolisthesis C3-4 and C4-5, solid fusion C5-6, mild anterior spurs C4-5 and C3-4, no acute abnormalties.   Xr Shoulder Right  Result Date: 05/18/2018 Cervical spine AP and lateral flexion and extension radiographs Fusion at the C5-6 with plate and screws there is no instability,  minimal anterolisthesis C3-4 and C4-5, solid fusion C5-6, mild anterior spurs C4-5 and C3-4, no acute abnormalties.     PMFS History: Patient Active Problem List   Diagnosis Date Noted  . Spondylosis, cervical, with myelopathy 10/03/2015    Priority: High  . Primary osteoarthritis of left knee 02/07/2017  . Postlaminectomy syndrome, cervical region 12/09/2015  . Cervical spondylosis with radiculopathy 10/04/2015  . Secondary adhesive capsulitis of left shoulder 10/03/2015  . Obesity 09/21/2015  . Nausea with vomiting   . SOB (shortness of breath)   . COPD with acute exacerbation (HCC) 11/14/2014  . Hypokalemia 11/14/2014  . Acute respiratory failure (HCC) 11/14/2014  . COPD exacerbation (HCC) 11/14/2014  . Abnormal EKG 11/14/2014  . COPD GOLD 0/ still smoking  06/17/2014  . Leg swelling 06/17/2014  . Fatigue 06/17/2014  . Cigarette smoker 01/20/2014  . Pulmonary infiltrate 01/11/2014  . MIXED INCONTINENCE URGE AND STRESS 09/19/2007  . Bipolar disorder (HCC) 06/26/2007  . FIBROMYALGIA 06/26/2007  . INSOMNIA 06/26/2007   Past Medical History:  Diagnosis Date  . Abscess    rectal  . Anxiety   . Arthritis    "back; hips" (11/14/2014)  . Bipolar disorder (HCC)   . Chronic back pain    "all over"  . Chronic bronchitis (HCC)    "I get it q yr" (11/14/2014)  . COPD (chronic obstructive pulmonary disease) (HCC)   . Depression   . Dyspnea    W/ EXERTION   . Emphysema lung (HCC)   . Endometriosis   . Fibromyalgia   . History of kidney stones   . ILD (interstitial lung disease) (HCC) 11/2013  . Migraine    "3 in the last 2 wks" (11/14/2014)  . Ovarian cyst   . Pulmonary infiltrates     Family History  Problem Relation Age of Onset  . Cancer Father        lung  . COPD Father   . Early death Father   . Hypertension Sister   . Cancer Maternal Aunt        LUNG CANCER  . Other Neg Hx     Past Surgical History:  Procedure Laterality Date  . ANTERIOR CERVICAL  DECOMP/DISCECTOMY FUSION  10/03/2015   Procedure: ANTERIOR CERVICAL DISCECTOMY  AND FUSION C5-6 WITH PLATE AND SCREWS, LOCAL AND ALLOGRAFT BONE GRAFT;  Surgeon: Kerrin Champagne, MD;  Location: [facility] OR;  Service: Orthopedics;;  . APPENDECTOMY  1993  . BREAST BIOPSY Left 2008  . BREAST LUMPECTOMY Left 2008   "benign"  . CYST EXCISION PERINEAL Left 01/2012   Hattie Perch 02/15/2012  . EXAMINATION UNDER ANESTHESIA  02/15/2012   Procedure: EXAM UNDER ANESTHESIA;  Surgeon: Maisie Fus A. Cornett, MD;  Location: [facility];  Service: General;  Laterality: Left;  Excision of perineal cyst  .  FOOT SURGERY Left 08/17/2016   gastroc recession and plantar fascia release  . LAPAROSCOPIC CHOLECYSTECTOMY  08/2007  . LAPAROSCOPIC LYSIS OF ADHESIONS  06/2010   Hattie Perch 06/18/2010  . LEFT OOPHORECTOMY Left 08/2013  . OVARIAN CYST REMOVAL  X 2-3  . SHOULDER CLOSED REDUCTION Left 10/03/2015   Procedure: CLOSED MANIPULATION SHOULDER CLOSED  AND INJECTION WITH LOCAL ANESTHETIC;  Surgeon: Kerrin Champagne, MD;  Location: [facility] OR;  Service: Orthopedics;  Laterality: Left;  . TRANSOBTURATOR SLING  09/2008   Transobturator mid urethral sling, cystoscopy, laparoscopy w/LOA/notes 03/04/2011  . TUBAL LIGATION  1993  . VAGINAL HYSTERECTOMY  1993  . VIDEO BRONCHOSCOPY Bilateral 02/15/2014   Procedure: VIDEO BRONCHOSCOPY WITH FLUORO;  Surgeon: Coralyn Helling, MD;  Location: [facility];  Service: Cardiopulmonary;  Laterality: Bilateral;   Social History   Occupational History  . Not on file  Tobacco Use  . Smoking status: Former Smoker    Packs/day: 0.50    Years: 32.00    Pack years: 16.00    Types: Cigarettes    Last attempt to quit: 02/20/2017    Years since quitting: 1.2  . Smokeless tobacco: Never Used  . Tobacco comment: currently 1/2 ppd/ STATES SHE QUIT OCT 2017  Substance and Sexual Activity  . Alcohol use: No    Alcohol/week: 0.0 oz  . Drug use: No  . Sexual activity: Not Currently    Birth control/protection:  Surgical

## 2018-06-15 ENCOUNTER — Ambulatory Visit (INDEPENDENT_AMBULATORY_CARE_PROVIDER_SITE_OTHER): Payer: Medicare Other | Admitting: Specialist

## 2018-06-27 DIAGNOSIS — F3181 Bipolar II disorder: Secondary | ICD-10-CM | POA: Diagnosis not present

## 2018-06-29 DIAGNOSIS — E785 Hyperlipidemia, unspecified: Secondary | ICD-10-CM | POA: Diagnosis not present

## 2018-07-10 DIAGNOSIS — M797 Fibromyalgia: Secondary | ICD-10-CM | POA: Diagnosis not present

## 2018-07-10 DIAGNOSIS — J449 Chronic obstructive pulmonary disease, unspecified: Secondary | ICD-10-CM | POA: Diagnosis not present

## 2018-07-10 DIAGNOSIS — G43009 Migraine without aura, not intractable, without status migrainosus: Secondary | ICD-10-CM | POA: Diagnosis not present

## 2018-07-10 DIAGNOSIS — E785 Hyperlipidemia, unspecified: Secondary | ICD-10-CM | POA: Diagnosis not present

## 2018-08-07 DIAGNOSIS — Z1231 Encounter for screening mammogram for malignant neoplasm of breast: Secondary | ICD-10-CM | POA: Diagnosis not present

## 2018-08-09 DIAGNOSIS — F3181 Bipolar II disorder: Secondary | ICD-10-CM | POA: Diagnosis not present

## 2018-09-06 DIAGNOSIS — M797 Fibromyalgia: Secondary | ICD-10-CM | POA: Diagnosis not present

## 2018-09-06 DIAGNOSIS — J449 Chronic obstructive pulmonary disease, unspecified: Secondary | ICD-10-CM | POA: Diagnosis not present

## 2018-09-06 DIAGNOSIS — F419 Anxiety disorder, unspecified: Secondary | ICD-10-CM | POA: Diagnosis not present

## 2018-09-06 DIAGNOSIS — M5030 Other cervical disc degeneration, unspecified cervical region: Secondary | ICD-10-CM | POA: Diagnosis not present

## 2018-09-21 DIAGNOSIS — H2513 Age-related nuclear cataract, bilateral: Secondary | ICD-10-CM | POA: Diagnosis not present

## 2018-09-21 DIAGNOSIS — H01021 Squamous blepharitis right upper eyelid: Secondary | ICD-10-CM | POA: Diagnosis not present

## 2018-09-21 DIAGNOSIS — H01024 Squamous blepharitis left upper eyelid: Secondary | ICD-10-CM | POA: Diagnosis not present

## 2018-09-21 DIAGNOSIS — H01022 Squamous blepharitis right lower eyelid: Secondary | ICD-10-CM | POA: Diagnosis not present

## 2018-09-25 DIAGNOSIS — K219 Gastro-esophageal reflux disease without esophagitis: Secondary | ICD-10-CM | POA: Diagnosis not present

## 2018-10-03 DIAGNOSIS — F3181 Bipolar II disorder: Secondary | ICD-10-CM | POA: Diagnosis not present

## 2018-10-27 DIAGNOSIS — F3181 Bipolar II disorder: Secondary | ICD-10-CM | POA: Diagnosis not present

## 2018-10-27 DIAGNOSIS — F4001 Agoraphobia with panic disorder: Secondary | ICD-10-CM | POA: Diagnosis not present

## 2018-11-02 DIAGNOSIS — F3181 Bipolar II disorder: Secondary | ICD-10-CM | POA: Diagnosis not present

## 2018-12-12 DIAGNOSIS — E785 Hyperlipidemia, unspecified: Secondary | ICD-10-CM | POA: Diagnosis not present

## 2018-12-12 DIAGNOSIS — J449 Chronic obstructive pulmonary disease, unspecified: Secondary | ICD-10-CM | POA: Diagnosis not present

## 2018-12-12 DIAGNOSIS — F17210 Nicotine dependence, cigarettes, uncomplicated: Secondary | ICD-10-CM | POA: Diagnosis not present

## 2018-12-12 DIAGNOSIS — F339 Major depressive disorder, recurrent, unspecified: Secondary | ICD-10-CM | POA: Diagnosis not present

## 2018-12-13 ENCOUNTER — Ambulatory Visit (INDEPENDENT_AMBULATORY_CARE_PROVIDER_SITE_OTHER): Payer: Medicare Other

## 2018-12-13 ENCOUNTER — Ambulatory Visit (INDEPENDENT_AMBULATORY_CARE_PROVIDER_SITE_OTHER): Payer: Medicare Other | Admitting: Orthopaedic Surgery

## 2018-12-13 ENCOUNTER — Encounter (INDEPENDENT_AMBULATORY_CARE_PROVIDER_SITE_OTHER): Payer: Self-pay | Admitting: Orthopaedic Surgery

## 2018-12-13 DIAGNOSIS — G8929 Other chronic pain: Secondary | ICD-10-CM

## 2018-12-13 DIAGNOSIS — M545 Low back pain: Secondary | ICD-10-CM

## 2018-12-13 MED ORDER — PREDNISONE 10 MG (21) PO TBPK: ORAL_TABLET | ORAL | 0 refills | Status: DC

## 2018-12-13 MED ORDER — CYCLOBENZAPRINE HCL 10 MG PO TABS: 10.0000 mg | ORAL_TABLET | Freq: Two times a day (BID) | ORAL | 0 refills | Status: DC | PRN

## 2018-12-13 NOTE — Progress Notes (Signed)
Office Visit Note   Patient: [name]           Date of Birth: [birth year]           MRN: [id number] Visit Date: 12/13/2018              Requested by: Pearson GrippeKim, James, MD [address] [place], [place] [phone] PCP: Pearson GrippeKim, James, MD   Assessment & Plan: Visit Diagnoses:  1. Chronic midline low back pain without sciatica     Plan: Impression is axial low back pain without radicular symptoms.  We have sent in a prescription for Sterapred and Flexeril.  Continue to use over-the-counter creams and Biofreeze.  Patient instructed to call us back if not better.  Follow-Up Instructions: Return if symptoms worsen or fail to improve.   Orders:  Orders Placed This Encounter  Procedures  . XR Lumbar Spine 2-3 Views   Meds ordered this encounter  Medications  . predniSONE (STERAPRED UNI-PAK 21 TAB) 10 MG (21) TBPK tablet    Sig: Take as directed    Dispense:  21 tablet    Refill:  0  . cyclobenzaprine (FLEXERIL) 10 MG tablet    Sig: Take 1 tablet (10 mg total) by mouth 2 (two) times daily as needed for muscle spasms.    Dispense:  20 tablet    Refill:  0      Procedures: No procedures performed   Clinical Data: No additional findings.   Subjective: No chief complaint on file.   [name] is a 53 year old female comes in with 1 month of low back pain without radicular symptoms.  Denies any numbness and tingling.  She has been taking ibuprofen which has not significantly helped.  She does have chronic back pain and fibromyalgia.  She denies any bowel bladder changes or dysfunction.   Review of Systems  Constitutional: Negative.   HENT: Negative.   Eyes: Negative.   Respiratory: Negative.   Cardiovascular: Negative.   Endocrine: Negative.   Musculoskeletal: Negative.   Neurological: Negative.   Hematological: Negative.   Psychiatric/Behavioral: Negative.   All other systems reviewed and are negative.    Objective: Vital Signs: There were no vitals  taken for this visit.  Physical Exam Vitals signs and nursing note reviewed.  Constitutional:      Appearance: She is well-developed.  Pulmonary:     Effort: Pulmonary effort is normal.  Skin:    General: Skin is warm.     Capillary Refill: Capillary refill takes less than 2 seconds.  Neurological:     Mental Status: She is alert and oriented to person, place, and time.  Psychiatric:        Behavior: Behavior normal.        Thought Content: Thought content normal.        Judgment: Judgment normal.     Ortho Exam Low back exam shows mild discomfort with palpation along the spinous processes and the paraspinal muscles.  Negative sciatic tension signs.  No focal motor or sensory deficits. Specialty Comments:  No specialty comments available.  Imaging: Xr Lumbar Spine 2-3 Views  Result Date: 12/13/2018 No acute or structural abnormalities.  No evidence of compression fracture.    PMFS History: Patient Active Problem List   Diagnosis Date Noted  . Primary osteoarthritis of left knee 02/07/2017  . Postlaminectomy syndrome, cervical region 12/09/2015  . Cervical spondylosis with radiculopathy 10/04/2015  . Spondylosis, cervical, with myelopathy 10/03/2015  . Secondary adhesive capsulitis of  left shoulder 10/03/2015  . Obesity 09/21/2015  . Nausea with vomiting   . SOB (shortness of breath)   . COPD with acute exacerbation (HCC) 11/14/2014  . Hypokalemia 11/14/2014  . Acute respiratory failure (HCC) 11/14/2014  . COPD exacerbation (HCC) 11/14/2014  . Abnormal EKG 11/14/2014  . COPD GOLD 0/ still smoking  06/17/2014  . Leg swelling 06/17/2014  . Fatigue 06/17/2014  . Cigarette smoker 01/20/2014  . Pulmonary infiltrate 01/11/2014  . MIXED INCONTINENCE URGE AND STRESS 09/19/2007  . Bipolar disorder (HCC) 06/26/2007  . FIBROMYALGIA 06/26/2007  . INSOMNIA 06/26/2007   Past Medical History:  Diagnosis Date  . Abscess    rectal  . Anxiety   . Arthritis    "back; hips"  (11/14/2014)  . Bipolar disorder (HCC)   . Chronic back pain    "all over"  . Chronic bronchitis (HCC)    "I get it q yr" (11/14/2014)  . COPD (chronic obstructive pulmonary disease) (HCC)   . Depression   . Dyspnea    W/ EXERTION   . Emphysema lung (HCC)   . Endometriosis   . Fibromyalgia   . History of kidney stones   . ILD (interstitial lung disease) (HCC) 11/2013  . Migraine    "3 in the last 2 wks" (11/14/2014)  . Ovarian cyst   . Pulmonary infiltrates     Family History  Problem Relation Age of Onset  . Cancer Father        lung  . COPD Father   . Early death Father   . Hypertension Sister   . Cancer Maternal Aunt        LUNG CANCER  . Other Neg Hx     Past Surgical History:  Procedure Laterality Date  . ANTERIOR CERVICAL DECOMP/DISCECTOMY FUSION  10/03/2015   Procedure: ANTERIOR CERVICAL DISCECTOMY  AND FUSION C5-6 WITH PLATE AND SCREWS, LOCAL AND ALLOGRAFT BONE GRAFT;  Surgeon: Kerrin Champagne, MD;  Location: [facility];  Service: Orthopedics;;  . APPENDECTOMY  1993  . BREAST BIOPSY Left 2008  . BREAST LUMPECTOMY Left 2008   "benign"  . CYST EXCISION PERINEAL Left 01/2012   Hattie Perch 02/15/2012  . EXAMINATION UNDER ANESTHESIA  02/15/2012   Procedure: EXAM UNDER ANESTHESIA;  Surgeon: Maisie Fus A. Cornett, MD;  Location: [facility];  Service: General;  Laterality: Left;  Excision of perineal cyst  . FOOT SURGERY Left 08/17/2016   gastroc recession and plantar fascia release  . LAPAROSCOPIC CHOLECYSTECTOMY  08/2007  . LAPAROSCOPIC LYSIS OF ADHESIONS  06/2010   Hattie Perch 06/18/2010  . LEFT OOPHORECTOMY Left 08/2013  . OVARIAN CYST REMOVAL  X 2-3  . SHOULDER CLOSED REDUCTION Left 10/03/2015   Procedure: CLOSED MANIPULATION SHOULDER CLOSED  AND INJECTION WITH LOCAL ANESTHETIC;  Surgeon: Kerrin Champagne, MD;  Location: [facility];  Service: Orthopedics;  Laterality: Left;  . TRANSOBTURATOR SLING  09/2008   Transobturator mid urethral sling, cystoscopy, laparoscopy w/LOA/notes  03/04/2011  . TUBAL LIGATION  1993  . VAGINAL HYSTERECTOMY  1993  . VIDEO BRONCHOSCOPY Bilateral 02/15/2014   Procedure: VIDEO BRONCHOSCOPY WITH FLUORO;  Surgeon: Coralyn Helling, MD;  Location: [facility];  Service: Cardiopulmonary;  Laterality: Bilateral;   Social History   Occupational History  . Not on file  Tobacco Use  . Smoking status: Former Smoker    Packs/day: 0.50    Years: 32.00    Pack years: 16.00    Types: Cigarettes    Last attempt to quit: 02/20/2017  Years since quitting: 1.8  . Smokeless tobacco: Never Used  . Tobacco comment: currently 1/2 ppd/ STATES SHE QUIT OCT 2017  Substance and Sexual Activity  . Alcohol use: No    Alcohol/week: 0.0 standard drinks  . Drug use: No  . Sexual activity: Not Currently    Birth control/protection: Surgical

## 2018-12-18 DIAGNOSIS — E785 Hyperlipidemia, unspecified: Secondary | ICD-10-CM | POA: Diagnosis not present

## 2018-12-18 DIAGNOSIS — E559 Vitamin D deficiency, unspecified: Secondary | ICD-10-CM | POA: Diagnosis not present

## 2018-12-18 DIAGNOSIS — Z0000 Encounter for general adult medical examination without abnormal findings: Secondary | ICD-10-CM | POA: Diagnosis not present

## 2018-12-21 DIAGNOSIS — F3181 Bipolar II disorder: Secondary | ICD-10-CM | POA: Diagnosis not present

## 2019-01-19 DIAGNOSIS — F330 Major depressive disorder, recurrent, mild: Secondary | ICD-10-CM | POA: Diagnosis not present

## 2019-01-19 DIAGNOSIS — F3181 Bipolar II disorder: Secondary | ICD-10-CM | POA: Diagnosis not present

## 2019-01-25 DIAGNOSIS — F429 Obsessive-compulsive disorder, unspecified: Secondary | ICD-10-CM | POA: Diagnosis not present

## 2019-01-25 DIAGNOSIS — F330 Major depressive disorder, recurrent, mild: Secondary | ICD-10-CM | POA: Diagnosis not present

## 2019-02-27 DIAGNOSIS — F3181 Bipolar II disorder: Secondary | ICD-10-CM | POA: Diagnosis not present

## 2019-03-12 DIAGNOSIS — R1013 Epigastric pain: Secondary | ICD-10-CM | POA: Diagnosis not present

## 2019-03-12 DIAGNOSIS — Z0000 Encounter for general adult medical examination without abnormal findings: Secondary | ICD-10-CM | POA: Diagnosis not present

## 2019-03-12 DIAGNOSIS — G43009 Migraine without aura, not intractable, without status migrainosus: Secondary | ICD-10-CM | POA: Diagnosis not present

## 2019-03-12 DIAGNOSIS — M5030 Other cervical disc degeneration, unspecified cervical region: Secondary | ICD-10-CM | POA: Diagnosis not present

## 2019-03-12 DIAGNOSIS — K219 Gastro-esophageal reflux disease without esophagitis: Secondary | ICD-10-CM | POA: Diagnosis not present

## 2019-03-12 DIAGNOSIS — J449 Chronic obstructive pulmonary disease, unspecified: Secondary | ICD-10-CM | POA: Diagnosis not present

## 2019-03-23 DIAGNOSIS — F3181 Bipolar II disorder: Secondary | ICD-10-CM | POA: Diagnosis not present

## 2019-03-27 DIAGNOSIS — G43909 Migraine, unspecified, not intractable, without status migrainosus: Secondary | ICD-10-CM | POA: Diagnosis not present

## 2019-03-27 DIAGNOSIS — R110 Nausea: Secondary | ICD-10-CM | POA: Diagnosis not present

## 2019-03-27 DIAGNOSIS — R140 Abdominal distension (gaseous): Secondary | ICD-10-CM | POA: Diagnosis not present

## 2019-03-27 DIAGNOSIS — Z7189 Other specified counseling: Secondary | ICD-10-CM | POA: Diagnosis not present

## 2019-04-02 DIAGNOSIS — R110 Nausea: Secondary | ICD-10-CM | POA: Diagnosis not present

## 2019-04-02 DIAGNOSIS — R12 Heartburn: Secondary | ICD-10-CM | POA: Diagnosis not present

## 2019-04-12 DIAGNOSIS — R110 Nausea: Secondary | ICD-10-CM | POA: Diagnosis not present

## 2019-04-12 DIAGNOSIS — R12 Heartburn: Secondary | ICD-10-CM | POA: Diagnosis not present

## 2019-04-12 DIAGNOSIS — K228 Other specified diseases of esophagus: Secondary | ICD-10-CM | POA: Diagnosis not present

## 2019-04-12 DIAGNOSIS — K210 Gastro-esophageal reflux disease with esophagitis: Secondary | ICD-10-CM | POA: Diagnosis not present

## 2019-04-12 DIAGNOSIS — K229 Disease of esophagus, unspecified: Secondary | ICD-10-CM | POA: Diagnosis not present

## 2019-04-12 DIAGNOSIS — K3189 Other diseases of stomach and duodenum: Secondary | ICD-10-CM | POA: Diagnosis not present

## 2019-04-12 DIAGNOSIS — K2930 Chronic superficial gastritis without bleeding: Secondary | ICD-10-CM | POA: Diagnosis not present

## 2019-04-12 DIAGNOSIS — B9681 Helicobacter pylori [H. pylori] as the cause of diseases classified elsewhere: Secondary | ICD-10-CM | POA: Diagnosis not present

## 2019-04-13 DIAGNOSIS — F3181 Bipolar II disorder: Secondary | ICD-10-CM | POA: Diagnosis not present

## 2019-04-19 DIAGNOSIS — B9681 Helicobacter pylori [H. pylori] as the cause of diseases classified elsewhere: Secondary | ICD-10-CM | POA: Diagnosis not present

## 2019-04-19 DIAGNOSIS — K210 Gastro-esophageal reflux disease with esophagitis: Secondary | ICD-10-CM | POA: Diagnosis not present

## 2019-04-19 DIAGNOSIS — K2930 Chronic superficial gastritis without bleeding: Secondary | ICD-10-CM | POA: Diagnosis not present

## 2019-04-25 DIAGNOSIS — F429 Obsessive-compulsive disorder, unspecified: Secondary | ICD-10-CM | POA: Diagnosis not present

## 2019-04-25 DIAGNOSIS — F330 Major depressive disorder, recurrent, mild: Secondary | ICD-10-CM | POA: Diagnosis not present

## 2019-05-22 DIAGNOSIS — F3181 Bipolar II disorder: Secondary | ICD-10-CM | POA: Diagnosis not present

## 2019-05-25 ENCOUNTER — Telehealth: Payer: Self-pay | Admitting: Specialist

## 2019-05-25 NOTE — Telephone Encounter (Signed)
Patient called and stated she missed a call from someone.  Patient requesting a call back.  [phone]

## 2019-05-25 NOTE — Telephone Encounter (Signed)
Patient called and requesting something for muscle spasm she is having in her shoulder.  Please call patient @ [phone]

## 2019-05-30 DIAGNOSIS — F3181 Bipolar II disorder: Secondary | ICD-10-CM | POA: Diagnosis not present

## 2019-06-15 ENCOUNTER — Ambulatory Visit: Payer: Medicare Other | Admitting: Orthopaedic Surgery

## 2019-06-19 ENCOUNTER — Other Ambulatory Visit: Payer: Self-pay

## 2019-06-19 DIAGNOSIS — Z20822 Contact with and (suspected) exposure to covid-19: Secondary | ICD-10-CM

## 2019-06-20 LAB — NOVEL CORONAVIRUS, NAA: SARS-CoV-2, NAA: NOT DETECTED

## 2019-06-27 DIAGNOSIS — F3181 Bipolar II disorder: Secondary | ICD-10-CM | POA: Diagnosis not present

## 2019-07-25 DIAGNOSIS — F3181 Bipolar II disorder: Secondary | ICD-10-CM | POA: Diagnosis not present

## 2019-07-25 DIAGNOSIS — F330 Major depressive disorder, recurrent, mild: Secondary | ICD-10-CM | POA: Diagnosis not present

## 2019-08-21 DIAGNOSIS — F3181 Bipolar II disorder: Secondary | ICD-10-CM | POA: Diagnosis not present

## 2019-08-28 DIAGNOSIS — F331 Major depressive disorder, recurrent, moderate: Secondary | ICD-10-CM | POA: Diagnosis not present

## 2019-08-28 DIAGNOSIS — F429 Obsessive-compulsive disorder, unspecified: Secondary | ICD-10-CM | POA: Diagnosis not present

## 2019-09-10 ENCOUNTER — Other Ambulatory Visit: Payer: Self-pay

## 2019-09-10 DIAGNOSIS — Z20822 Contact with and (suspected) exposure to covid-19: Secondary | ICD-10-CM

## 2019-09-13 LAB — NOVEL CORONAVIRUS, NAA: SARS-CoV-2, NAA: NOT DETECTED

## 2019-09-18 DIAGNOSIS — E559 Vitamin D deficiency, unspecified: Secondary | ICD-10-CM | POA: Diagnosis not present

## 2019-09-18 DIAGNOSIS — R7309 Other abnormal glucose: Secondary | ICD-10-CM | POA: Diagnosis not present

## 2019-09-18 DIAGNOSIS — Z0000 Encounter for general adult medical examination without abnormal findings: Secondary | ICD-10-CM | POA: Diagnosis not present

## 2019-09-18 DIAGNOSIS — E785 Hyperlipidemia, unspecified: Secondary | ICD-10-CM | POA: Diagnosis not present

## 2019-09-24 DIAGNOSIS — F29 Unspecified psychosis not due to a substance or known physiological condition: Secondary | ICD-10-CM | POA: Diagnosis not present

## 2019-09-24 DIAGNOSIS — F3181 Bipolar II disorder: Secondary | ICD-10-CM | POA: Diagnosis not present

## 2019-09-25 DIAGNOSIS — N63 Unspecified lump in unspecified breast: Secondary | ICD-10-CM | POA: Diagnosis not present

## 2019-09-25 DIAGNOSIS — K219 Gastro-esophageal reflux disease without esophagitis: Secondary | ICD-10-CM | POA: Diagnosis not present

## 2019-09-25 DIAGNOSIS — G43909 Migraine, unspecified, not intractable, without status migrainosus: Secondary | ICD-10-CM | POA: Diagnosis not present

## 2019-09-25 DIAGNOSIS — M5030 Other cervical disc degeneration, unspecified cervical region: Secondary | ICD-10-CM | POA: Diagnosis not present

## 2019-10-16 DIAGNOSIS — F3181 Bipolar II disorder: Secondary | ICD-10-CM | POA: Diagnosis not present

## 2019-11-16 DIAGNOSIS — F3181 Bipolar II disorder: Secondary | ICD-10-CM | POA: Diagnosis not present

## 2019-11-22 DIAGNOSIS — N6489 Other specified disorders of breast: Secondary | ICD-10-CM | POA: Diagnosis not present

## 2019-11-22 DIAGNOSIS — R922 Inconclusive mammogram: Secondary | ICD-10-CM | POA: Diagnosis not present

## 2019-11-26 DIAGNOSIS — F429 Obsessive-compulsive disorder, unspecified: Secondary | ICD-10-CM | POA: Diagnosis not present

## 2019-11-26 DIAGNOSIS — F3181 Bipolar II disorder: Secondary | ICD-10-CM | POA: Diagnosis not present

## 2019-11-26 DIAGNOSIS — F4001 Agoraphobia with panic disorder: Secondary | ICD-10-CM | POA: Diagnosis not present

## 2019-12-12 DIAGNOSIS — H2513 Age-related nuclear cataract, bilateral: Secondary | ICD-10-CM | POA: Diagnosis not present

## 2019-12-12 DIAGNOSIS — H0102A Squamous blepharitis right eye, upper and lower eyelids: Secondary | ICD-10-CM | POA: Diagnosis not present

## 2019-12-12 DIAGNOSIS — H53023 Refractive amblyopia, bilateral: Secondary | ICD-10-CM | POA: Diagnosis not present

## 2019-12-12 DIAGNOSIS — H0102B Squamous blepharitis left eye, upper and lower eyelids: Secondary | ICD-10-CM | POA: Diagnosis not present

## 2019-12-18 DIAGNOSIS — F3181 Bipolar II disorder: Secondary | ICD-10-CM | POA: Diagnosis not present

## 2020-01-15 DIAGNOSIS — F3181 Bipolar II disorder: Secondary | ICD-10-CM | POA: Diagnosis not present

## 2020-02-04 DIAGNOSIS — E785 Hyperlipidemia, unspecified: Secondary | ICD-10-CM | POA: Diagnosis not present

## 2020-02-04 DIAGNOSIS — R000 Tachycardia, unspecified: Secondary | ICD-10-CM | POA: Diagnosis not present

## 2020-02-11 DIAGNOSIS — E785 Hyperlipidemia, unspecified: Secondary | ICD-10-CM | POA: Diagnosis not present

## 2020-02-11 DIAGNOSIS — M5030 Other cervical disc degeneration, unspecified cervical region: Secondary | ICD-10-CM | POA: Diagnosis not present

## 2020-02-11 DIAGNOSIS — G43009 Migraine without aura, not intractable, without status migrainosus: Secondary | ICD-10-CM | POA: Diagnosis not present

## 2020-02-11 DIAGNOSIS — E559 Vitamin D deficiency, unspecified: Secondary | ICD-10-CM | POA: Diagnosis not present

## 2020-02-12 DIAGNOSIS — F3181 Bipolar II disorder: Secondary | ICD-10-CM | POA: Diagnosis not present

## 2020-02-12 DIAGNOSIS — F330 Major depressive disorder, recurrent, mild: Secondary | ICD-10-CM | POA: Diagnosis not present

## 2020-02-25 DIAGNOSIS — F429 Obsessive-compulsive disorder, unspecified: Secondary | ICD-10-CM | POA: Diagnosis not present

## 2020-02-25 DIAGNOSIS — F330 Major depressive disorder, recurrent, mild: Secondary | ICD-10-CM | POA: Diagnosis not present

## 2020-02-25 DIAGNOSIS — F4001 Agoraphobia with panic disorder: Secondary | ICD-10-CM | POA: Diagnosis not present

## 2020-02-25 DIAGNOSIS — F4310 Post-traumatic stress disorder, unspecified: Secondary | ICD-10-CM | POA: Diagnosis not present

## 2020-03-11 DIAGNOSIS — F3181 Bipolar II disorder: Secondary | ICD-10-CM | POA: Diagnosis not present

## 2020-03-11 DIAGNOSIS — F330 Major depressive disorder, recurrent, mild: Secondary | ICD-10-CM | POA: Diagnosis not present

## 2020-04-11 DIAGNOSIS — F3181 Bipolar II disorder: Secondary | ICD-10-CM | POA: Diagnosis not present

## 2020-05-23 DIAGNOSIS — C44622 Squamous cell carcinoma of skin of right upper limb, including shoulder: Secondary | ICD-10-CM | POA: Diagnosis not present

## 2020-05-23 DIAGNOSIS — D485 Neoplasm of uncertain behavior of skin: Secondary | ICD-10-CM | POA: Diagnosis not present

## 2020-05-23 DIAGNOSIS — L578 Other skin changes due to chronic exposure to nonionizing radiation: Secondary | ICD-10-CM | POA: Diagnosis not present

## 2020-05-23 DIAGNOSIS — D225 Melanocytic nevi of trunk: Secondary | ICD-10-CM | POA: Diagnosis not present

## 2020-05-23 DIAGNOSIS — C44519 Basal cell carcinoma of skin of other part of trunk: Secondary | ICD-10-CM | POA: Diagnosis not present

## 2020-06-17 DIAGNOSIS — Z20828 Contact with and (suspected) exposure to other viral communicable diseases: Secondary | ICD-10-CM | POA: Diagnosis not present

## 2020-06-19 DIAGNOSIS — C44622 Squamous cell carcinoma of skin of right upper limb, including shoulder: Secondary | ICD-10-CM | POA: Diagnosis not present

## 2020-06-19 DIAGNOSIS — C44519 Basal cell carcinoma of skin of other part of trunk: Secondary | ICD-10-CM | POA: Diagnosis not present

## 2020-06-25 DIAGNOSIS — F3181 Bipolar II disorder: Secondary | ICD-10-CM | POA: Diagnosis not present

## 2020-07-29 DIAGNOSIS — F3181 Bipolar II disorder: Secondary | ICD-10-CM | POA: Diagnosis not present

## 2020-08-07 DIAGNOSIS — Z0000 Encounter for general adult medical examination without abnormal findings: Secondary | ICD-10-CM | POA: Diagnosis not present

## 2020-08-07 DIAGNOSIS — E559 Vitamin D deficiency, unspecified: Secondary | ICD-10-CM | POA: Diagnosis not present

## 2020-08-07 DIAGNOSIS — R7309 Other abnormal glucose: Secondary | ICD-10-CM | POA: Diagnosis not present

## 2020-08-07 DIAGNOSIS — E785 Hyperlipidemia, unspecified: Secondary | ICD-10-CM | POA: Diagnosis not present

## 2020-08-07 DIAGNOSIS — R946 Abnormal results of thyroid function studies: Secondary | ICD-10-CM | POA: Diagnosis not present

## 2020-08-12 DIAGNOSIS — E785 Hyperlipidemia, unspecified: Secondary | ICD-10-CM | POA: Diagnosis not present

## 2020-08-12 DIAGNOSIS — Z0000 Encounter for general adult medical examination without abnormal findings: Secondary | ICD-10-CM | POA: Diagnosis not present

## 2020-08-12 DIAGNOSIS — M549 Dorsalgia, unspecified: Secondary | ICD-10-CM | POA: Diagnosis not present

## 2020-08-12 DIAGNOSIS — C4492 Squamous cell carcinoma of skin, unspecified: Secondary | ICD-10-CM | POA: Diagnosis not present

## 2020-08-14 ENCOUNTER — Ambulatory Visit (INDEPENDENT_AMBULATORY_CARE_PROVIDER_SITE_OTHER): Payer: Medicare Other | Admitting: Orthopaedic Surgery

## 2020-08-14 ENCOUNTER — Encounter: Payer: Self-pay | Admitting: Orthopaedic Surgery

## 2020-08-14 ENCOUNTER — Ambulatory Visit: Payer: Self-pay

## 2020-08-14 DIAGNOSIS — M545 Low back pain, unspecified: Secondary | ICD-10-CM

## 2020-08-14 MED ORDER — MELOXICAM 7.5 MG PO TABS: 7.5000 mg | ORAL_TABLET | Freq: Every day | ORAL | 2 refills | Status: DC | PRN

## 2020-08-14 MED ORDER — HYDROCODONE-ACETAMINOPHEN 5-325 MG PO TABS: 1.0000 | ORAL_TABLET | Freq: Every day | ORAL | 0 refills | Status: DC | PRN

## 2020-08-14 MED ORDER — PREDNISONE 10 MG (21) PO TBPK: ORAL_TABLET | ORAL | 0 refills | Status: DC

## 2020-08-14 NOTE — Progress Notes (Signed)
Office Visit Note   Patient: [name]           Date of Birth: 14-Aug-1966           MRN: [id number] Visit Date: 08/14/2020              Requested by: Pearson Grippe, MD [address] [place],  [place] [address] PCP: Pearson Grippe, MD   Assessment & Plan: Visit Diagnoses:  1. Low back pain, unspecified back pain laterality, unspecified chronicity, unspecified whether sciatica present     Plan: Impression is chronic low back pain with bilateral lower extremity radiculopathy.  We will start the patient on a steroid taper for which she states she is able to tolerate followed by a course of anti-inflammatories.  We will also start her in physical therapy and an internal referral has been sent in.  Have agreed to call in one small prescription of Norco that she will take very sparingly.  She will follow up with [place] in 6 weeks time for recheck.  Call with concerns or questions.  Follow-Up Instructions: No follow-ups on file.   Orders:  Orders Placed This Encounter  Procedures  . XR Lumbar Spine 2-3 Views   Meds ordered this encounter  Medications  . HYDROcodone-acetaminophen (NORCO) 5-325 MG tablet    Sig: Take 1-2 tablets by mouth daily as needed.    Dispense:  10 tablet    Refill:  0  . predniSONE (STERAPRED UNI-PAK 21 TAB) 10 MG (21) TBPK tablet    Sig: Patient states she is able to take oral steroids    Dispense:  21 tablet    Refill:  0  . meloxicam (MOBIC) 7.5 MG tablet    Sig: Take 1 tablet (7.5 mg total) by mouth daily as needed for up to 30 doses for pain. Do not start until finished with oral steroids    Dispense:  30 tablet    Refill:  2      Procedures: No procedures performed   Clinical Data: No additional findings.   Subjective: Chief Complaint  Patient presents with  . Lower Back - Pain    HPI patient is a pleasant 54 year old female who comes in today with bilateral lower back pain and bilateral lower extremity radiculopathy after  falling over a baby gate in June of this year.  Her symptoms have aggressively worsened to the point that she is uncomfortable in any position.  She has been taking Tylenol, ibuprofen and Bayer back and body as well as Voltaren cream and icy hot all without relief of symptoms.  She denies any numbness, tingling or burning.  No focal weakness.  No bowel or bladder dysfunction.  No history of lumbar pathology.  Review of Systems as detailed in HPI.  All others reviewed and are negative.   Objective: Vital Signs: There were no vitals taken for this visit.  Physical Exam well-developed well-nourished female no acute distress.  Alert oriented x3.  Ortho Exam lumbar exam shows mild tenderness to the lumbar spine.  No paraspinous tenderness.  She does have increased pain with lumbar flexion, extension and rotation.  Positive straight leg raise both sides.  No focal weakness.  She is neurovascular intact distally.  Specialty Comments:  No specialty comments available.  Imaging: XR Lumbar Spine 2-3 Views  Result Date: 08/14/2020 X-rays show no acute or structural abnormalities    PMFS History: Patient Active Problem List   Diagnosis Date Noted  . Primary osteoarthritis  of left knee 02/07/2017  . Postlaminectomy syndrome, cervical region 12/09/2015  . Cervical spondylosis with radiculopathy 10/04/2015  . Spondylosis, cervical, with myelopathy 10/03/2015  . Secondary adhesive capsulitis of left shoulder 10/03/2015  . Obesity 09/21/2015  . Nausea with vomiting   . SOB (shortness of breath)   . COPD with acute exacerbation (HCC) 11/14/2014  . Hypokalemia 11/14/2014  . Acute respiratory failure (HCC) 11/14/2014  . COPD exacerbation (HCC) 11/14/2014  . Abnormal EKG 11/14/2014  . COPD GOLD 0/ still smoking  06/17/2014  . Leg swelling 06/17/2014  . Fatigue 06/17/2014  . Cigarette smoker 01/20/2014  . Pulmonary infiltrate 01/11/2014  . MIXED INCONTINENCE URGE AND STRESS 09/19/2007  .  Bipolar disorder (HCC) 06/26/2007  . FIBROMYALGIA 06/26/2007  . INSOMNIA 06/26/2007   Past Medical History:  Diagnosis Date  . Abscess    rectal  . Anxiety   . Arthritis    "back; hips" (11/14/2014)  . Bipolar disorder (HCC)   . Chronic back pain    "all over"  . Chronic bronchitis (HCC)    "I get it q yr" (11/14/2014)  . COPD (chronic obstructive pulmonary disease) (HCC)   . Depression   . Dyspnea    W/ EXERTION   . Emphysema lung (HCC)   . Endometriosis   . Fibromyalgia   . History of kidney stones   . ILD (interstitial lung disease) (HCC) 11/2013  . Migraine    "3 in the last 2 wks" (11/14/2014)  . Ovarian cyst   . Pulmonary infiltrates     Family History  Problem Relation Age of Onset  . Cancer Father        lung  . COPD Father   . Early death Father   . Hypertension Sister   . Cancer Maternal Aunt        LUNG CANCER  . Other Neg Hx     Past Surgical History:  Procedure Laterality Date  . ANTERIOR CERVICAL DECOMP/DISCECTOMY FUSION  10/03/2015   Procedure: ANTERIOR CERVICAL DISCECTOMY  AND FUSION C5-6 WITH PLATE AND SCREWS, LOCAL AND ALLOGRAFT BONE GRAFT;  Surgeon: Kerrin Champagne, MD;  Location: [facility];  Service: Orthopedics;;  . APPENDECTOMY  1993  . BREAST BIOPSY Left 2008  . BREAST LUMPECTOMY Left 2008   "benign"  . CYST EXCISION PERINEAL Left 01/2012   Hattie Perch 02/15/2012  . EXAMINATION UNDER ANESTHESIA  02/15/2012   Procedure: EXAM UNDER ANESTHESIA;  Surgeon: Maisie Fus A. Cornett, MD;  Location: [facility];  Service: General;  Laterality: Left;  Excision of perineal cyst  . FOOT SURGERY Left 08/17/2016   gastroc recession and plantar fascia release  . LAPAROSCOPIC CHOLECYSTECTOMY  08/2007  . LAPAROSCOPIC LYSIS OF ADHESIONS  06/2010   Hattie Perch 06/18/2010  . LEFT OOPHORECTOMY Left 08/2013  . OVARIAN CYST REMOVAL  X 2-3  . SHOULDER CLOSED REDUCTION Left 10/03/2015   Procedure: CLOSED MANIPULATION SHOULDER CLOSED  AND INJECTION WITH LOCAL ANESTHETIC;   Surgeon: Kerrin Champagne, MD;  Location: [facility];  Service: Orthopedics;  Laterality: Left;  . TRANSOBTURATOR SLING  09/2008   Transobturator mid urethral sling, cystoscopy, laparoscopy w/LOA/notes 03/04/2011  . TUBAL LIGATION  1993  . VAGINAL HYSTERECTOMY  1993  . VIDEO BRONCHOSCOPY Bilateral 02/15/2014   Procedure: VIDEO BRONCHOSCOPY WITH FLUORO;  Surgeon: Coralyn Helling, MD;  Location: [facility];  Service: Cardiopulmonary;  Laterality: Bilateral;   Social History   Occupational History  . Not on file  Tobacco Use  . Smoking status: Former  Smoker    Packs/day: 0.50    Years: 32.00    Pack years: 16.00    Types: Cigarettes    Quit date: 02/20/2017    Years since quitting: 3.4  . Smokeless tobacco: Never Used  . Tobacco comment: currently 1/2 ppd/ STATES SHE QUIT OCT 2017  Vaping Use  . Vaping Use: Never used  Substance and Sexual Activity  . Alcohol use: No    Alcohol/week: 0.0 standard drinks  . Drug use: No  . Sexual activity: Not Currently    Birth control/protection: Surgical

## 2020-08-21 ENCOUNTER — Telehealth: Payer: Self-pay

## 2020-08-21 DIAGNOSIS — F4001 Agoraphobia with panic disorder: Secondary | ICD-10-CM | POA: Diagnosis not present

## 2020-08-21 DIAGNOSIS — F4310 Post-traumatic stress disorder, unspecified: Secondary | ICD-10-CM | POA: Diagnosis not present

## 2020-08-21 DIAGNOSIS — F3181 Bipolar II disorder: Secondary | ICD-10-CM | POA: Diagnosis not present

## 2020-08-21 NOTE — Telephone Encounter (Signed)
Patient called she is requesting prescription refill for meloxicam and she stated prednisone is not working she is requesting new medication to replace it and she is also requesting pain medication as well . Call back:[phone]

## 2020-08-22 ENCOUNTER — Other Ambulatory Visit: Payer: Self-pay | Admitting: Physician Assistant

## 2020-08-22 MED ORDER — TRAMADOL HCL 50 MG PO TABS: 50.0000 mg | ORAL_TABLET | Freq: Two times a day (BID) | ORAL | 0 refills | Status: DC | PRN

## 2020-08-22 MED ORDER — MELOXICAM 7.5 MG PO TABS: 7.5000 mg | ORAL_TABLET | Freq: Every day | ORAL | 2 refills | Status: AC

## 2020-08-22 NOTE — Telephone Encounter (Signed)
Sent in mobic and tramadol

## 2020-08-22 NOTE — Telephone Encounter (Signed)
Tried to call pt to advise no answer and no voicemail. Will hold and try again later.

## 2020-08-25 DIAGNOSIS — F3181 Bipolar II disorder: Secondary | ICD-10-CM | POA: Diagnosis not present

## 2020-09-03 ENCOUNTER — Ambulatory Visit (INDEPENDENT_AMBULATORY_CARE_PROVIDER_SITE_OTHER): Payer: Medicare Other | Admitting: Orthopaedic Surgery

## 2020-09-03 ENCOUNTER — Other Ambulatory Visit: Payer: Self-pay

## 2020-09-03 DIAGNOSIS — G8929 Other chronic pain: Secondary | ICD-10-CM | POA: Diagnosis not present

## 2020-09-03 DIAGNOSIS — M545 Low back pain, unspecified: Secondary | ICD-10-CM | POA: Diagnosis not present

## 2020-09-03 MED ORDER — OXYCODONE-ACETAMINOPHEN 5-325 MG PO TABS: 1.0000 | ORAL_TABLET | Freq: Every day | ORAL | 0 refills | Status: DC | PRN

## 2020-09-03 NOTE — Addendum Note (Signed)
Addended by: Albertina Parr on: 09/03/2020 11:51 AM   Modules accepted: Orders

## 2020-09-03 NOTE — Progress Notes (Signed)
Office Visit Note   Patient: [name]           Date of Birth: April 28, 1966           MRN: [id number] Visit Date: 09/03/2020              Requested by: Pearson Grippe, MD [address] [place],  [place] [address] PCP: Pearson Grippe, MD   Assessment & Plan: Visit Diagnoses:  1. Chronic midline low back pain without sciatica     Plan: At this point given the nature and severity of the pain we will need to obtain MRI to rule out structural abnormalities.  A small supply of Percocet sent in today.  This will not be refilled.  Follow-up after the MRI.  Follow-Up Instructions: Return if symptoms worsen or fail to improve.   Orders:  No orders of the defined types were placed in this encounter.  Meds ordered this encounter  Medications  . oxyCODONE-acetaminophen (PERCOCET) 5-325 MG tablet    Sig: Take 1 tablet by mouth daily as needed for severe pain.    Dispense:  10 tablet    Refill:  0      Procedures: No procedures performed   Clinical Data: No additional findings.   Subjective: Chief Complaint  Patient presents with  . Lower Back - Pain    [name] returns today for continued severe low back pain.  She has not found any relief with the Dosepak or Norco.  She is in too much pain to do physical therapy.   Review of Systems   Objective: Vital Signs: There were no vitals taken for this visit.  Physical Exam  Ortho Exam Low back exam is unchanged.  She has severe pain with palpation. Specialty Comments:  No specialty comments available.  Imaging: No results found.   PMFS History: Patient Active Problem List   Diagnosis Date Noted  . Primary osteoarthritis of left knee 02/07/2017  . Postlaminectomy syndrome, cervical region 12/09/2015  . Cervical spondylosis with radiculopathy 10/04/2015  . Spondylosis, cervical, with myelopathy 10/03/2015  . Secondary adhesive capsulitis of left shoulder 10/03/2015  . Obesity 09/21/2015  . Nausea with  vomiting   . SOB (shortness of breath)   . COPD with acute exacerbation (HCC) 11/14/2014  . Hypokalemia 11/14/2014  . Acute respiratory failure (HCC) 11/14/2014  . COPD exacerbation (HCC) 11/14/2014  . Abnormal EKG 11/14/2014  . COPD GOLD 0/ still smoking  06/17/2014  . Leg swelling 06/17/2014  . Fatigue 06/17/2014  . Cigarette smoker 01/20/2014  . Pulmonary infiltrate 01/11/2014  . MIXED INCONTINENCE URGE AND STRESS 09/19/2007  . Bipolar disorder (HCC) 06/26/2007  . FIBROMYALGIA 06/26/2007  . INSOMNIA 06/26/2007   Past Medical History:  Diagnosis Date  . Abscess    rectal  . Anxiety   . Arthritis    "back; hips" (11/14/2014)  . Bipolar disorder (HCC)   . Chronic back pain    "all over"  . Chronic bronchitis (HCC)    "I get it q yr" (11/14/2014)  . COPD (chronic obstructive pulmonary disease) (HCC)   . Depression   . Dyspnea    W/ EXERTION   . Emphysema lung (HCC)   . Endometriosis   . Fibromyalgia   . History of kidney stones   . ILD (interstitial lung disease) (HCC) 11/2013  . Migraine    "3 in the last 2 wks" (11/14/2014)  . Ovarian cyst   . Pulmonary infiltrates     Family  History  Problem Relation Age of Onset  . Cancer Father        lung  . COPD Father   . Early death Father   . Hypertension Sister   . Cancer Maternal Aunt        LUNG CANCER  . Other Neg Hx     Past Surgical History:  Procedure Laterality Date  . ANTERIOR CERVICAL DECOMP/DISCECTOMY FUSION  10/03/2015   Procedure: ANTERIOR CERVICAL DISCECTOMY  AND FUSION C5-6 WITH PLATE AND SCREWS, LOCAL AND ALLOGRAFT BONE GRAFT;  Surgeon: Kerrin Champagne, MD;  Location: [facility];  Service: Orthopedics;;  . APPENDECTOMY  1993  . BREAST BIOPSY Left 2008  . BREAST LUMPECTOMY Left 2008   "benign"  . CYST EXCISION PERINEAL Left 01/2012   Hattie Perch 02/15/2012  . EXAMINATION UNDER ANESTHESIA  02/15/2012   Procedure: EXAM UNDER ANESTHESIA;  Surgeon: Maisie Fus A. Cornett, MD;  Location: [facility];  Service:  General;  Laterality: Left;  Excision of perineal cyst  . FOOT SURGERY Left 08/17/2016   gastroc recession and plantar fascia release  . LAPAROSCOPIC CHOLECYSTECTOMY  08/2007  . LAPAROSCOPIC LYSIS OF ADHESIONS  06/2010   Hattie Perch 06/18/2010  . LEFT OOPHORECTOMY Left 08/2013  . OVARIAN CYST REMOVAL  X 2-3  . SHOULDER CLOSED REDUCTION Left 10/03/2015   Procedure: CLOSED MANIPULATION SHOULDER CLOSED  AND INJECTION WITH LOCAL ANESTHETIC;  Surgeon: Kerrin Champagne, MD;  Location: [facility];  Service: Orthopedics;  Laterality: Left;  . TRANSOBTURATOR SLING  09/2008   Transobturator mid urethral sling, cystoscopy, laparoscopy w/LOA/notes 03/04/2011  . TUBAL LIGATION  1993  . VAGINAL HYSTERECTOMY  1993  . VIDEO BRONCHOSCOPY Bilateral 02/15/2014   Procedure: VIDEO BRONCHOSCOPY WITH FLUORO;  Surgeon: Coralyn Helling, MD;  Location: [facility];  Service: Cardiopulmonary;  Laterality: Bilateral;   Social History   Occupational History  . Not on file  Tobacco Use  . Smoking status: Former Smoker    Packs/day: 0.50    Years: 32.00    Pack years: 16.00    Types: Cigarettes    Quit date: 02/20/2017    Years since quitting: 3.5  . Smokeless tobacco: Never Used  . Tobacco comment: currently 1/2 ppd/ STATES SHE QUIT OCT 2017  Vaping Use  . Vaping Use: Never used  Substance and Sexual Activity  . Alcohol use: No    Alcohol/week: 0.0 standard drinks  . Drug use: No  . Sexual activity: Not Currently    Birth control/protection: Surgical

## 2020-09-09 ENCOUNTER — Ambulatory Visit: Payer: Medicare Other | Attending: Physician Assistant

## 2020-09-12 ENCOUNTER — Telehealth: Payer: Self-pay | Admitting: Orthopaedic Surgery

## 2020-09-12 ENCOUNTER — Other Ambulatory Visit: Payer: Self-pay

## 2020-09-12 ENCOUNTER — Emergency Department (HOSPITAL_COMMUNITY)
Admission: EM | Admit: 2020-09-12 | Discharge: 2020-09-12 | Disposition: A | Discharge status: Home / Self Care | Payer: Medicare Other | Attending: Emergency Medicine | Admitting: Emergency Medicine

## 2020-09-12 ENCOUNTER — Encounter (HOSPITAL_COMMUNITY): Payer: Self-pay

## 2020-09-12 DIAGNOSIS — Z87891 Personal history of nicotine dependence: Secondary | ICD-10-CM | POA: Insufficient documentation

## 2020-09-12 DIAGNOSIS — Z79899 Other long term (current) drug therapy: Secondary | ICD-10-CM | POA: Diagnosis not present

## 2020-09-12 DIAGNOSIS — N819 Female genital prolapse, unspecified: Secondary | ICD-10-CM

## 2020-09-12 DIAGNOSIS — J441 Chronic obstructive pulmonary disease with (acute) exacerbation: Secondary | ICD-10-CM | POA: Insufficient documentation

## 2020-09-12 NOTE — ED Notes (Signed)
Patient verbalizes understanding of discharge instructions. Opportunity for questioning and answers were provided. Pt discharged from ED ambulatory.  

## 2020-09-12 NOTE — ED Provider Notes (Signed)
[facility] EMERGENCY DEPARTMENT Provider Note   CSN: [id number] Arrival date & time: 09/12/20  1455     History Chief Complaint  Patient presents with  . Prolapsed bladder    Baird CancerConnie R Waltermire is a 54 y.o. female with past medical history of fibromyalgia, status post hysterectomy, COPD, depression, anxiety, who presents today for evaluation of prolapse. She first noticed it yesterday. History of hysterectomy. She reports that it is uncomfortable. She noticed it yesterday when she was wiping. She denies any vaginal bleeding. She denies dysuria, increased frequency or urgency however is unclear if she can fully empty her bladder. She reportedly had a vaginal sling back in the nineties for incontinence. She has an appointment with her OB/GYN on the 24th of this month to address this issue. She reports feeling very anxious about this concern today.  HPI     Past Medical History:  Diagnosis Date  . Abscess    rectal  . Anxiety   . Arthritis    "back; hips" (11/14/2014)  . Bipolar disorder (HCC)   . Chronic back pain    "all over"  . Chronic bronchitis (HCC)    "I get it q yr" (11/14/2014)  . COPD (chronic obstructive pulmonary disease) (HCC)   . Depression   . Dyspnea    W/ EXERTION   . Emphysema lung (HCC)   . Endometriosis   . Fibromyalgia   . History of kidney stones   . ILD (interstitial lung disease) (HCC) 11/2013  . Migraine    "3 in the last 2 wks" (11/14/2014)  . Ovarian cyst   . Pulmonary infiltrates     Patient Active Problem List   Diagnosis Date Noted  . Primary osteoarthritis of left knee 02/07/2017  . Postlaminectomy syndrome, cervical region 12/09/2015  . Cervical spondylosis with radiculopathy 10/04/2015  . Spondylosis, cervical, with myelopathy 10/03/2015  . Secondary adhesive capsulitis of left shoulder 10/03/2015  . Obesity 09/21/2015  . Nausea with vomiting   . SOB (shortness of breath)   . COPD with acute exacerbation (HCC)  11/14/2014  . Hypokalemia 11/14/2014  . Acute respiratory failure (HCC) 11/14/2014  . COPD exacerbation (HCC) 11/14/2014  . Abnormal EKG 11/14/2014  . COPD GOLD 0/ still smoking  06/17/2014  . Leg swelling 06/17/2014  . Fatigue 06/17/2014  . Cigarette smoker 01/20/2014  . Pulmonary infiltrate 01/11/2014  . MIXED INCONTINENCE URGE AND STRESS 09/19/2007  . Bipolar disorder (HCC) 06/26/2007  . FIBROMYALGIA 06/26/2007  . INSOMNIA 06/26/2007    Past Surgical History:  Procedure Laterality Date  . ANTERIOR CERVICAL DECOMP/DISCECTOMY FUSION  10/03/2015   Procedure: ANTERIOR CERVICAL DISCECTOMY  AND FUSION C5-6 WITH PLATE AND SCREWS, LOCAL AND ALLOGRAFT BONE GRAFT;  Surgeon: Kerrin ChampagneJames E Nitka, MD;  Location: [facility];  Service: Orthopedics;;  . APPENDECTOMY  1993  . BREAST BIOPSY Left 2008  . BREAST LUMPECTOMY Left 2008   "benign"  . CYST EXCISION PERINEAL Left 01/2012   Hattie Perch/notes 02/15/2012  . EXAMINATION UNDER ANESTHESIA  02/15/2012   Procedure: EXAM UNDER ANESTHESIA;  Surgeon: Maisie Fushomas A. Cornett, MD;  Location: [facility];  Service: General;  Laterality: Left;  Excision of perineal cyst  . FOOT SURGERY Left 08/17/2016   gastroc recession and plantar fascia release  . LAPAROSCOPIC CHOLECYSTECTOMY  08/2007  . LAPAROSCOPIC LYSIS OF ADHESIONS  06/2010   Hattie Perch/notes 06/18/2010  . LEFT OOPHORECTOMY Left 08/2013  . OVARIAN CYST REMOVAL  X 2-3  . SHOULDER CLOSED REDUCTION Left 10/03/2015  Procedure: CLOSED MANIPULATION SHOULDER CLOSED  AND INJECTION WITH LOCAL ANESTHETIC;  Surgeon: Kerrin Champagne, MD;  Location: [facility];  Service: Orthopedics;  Laterality: Left;  . TRANSOBTURATOR SLING  09/2008   Transobturator mid urethral sling, cystoscopy, laparoscopy w/LOA/notes 03/04/2011  . TUBAL LIGATION  1993  . VAGINAL HYSTERECTOMY  1993  . VIDEO BRONCHOSCOPY Bilateral 02/15/2014   Procedure: VIDEO BRONCHOSCOPY WITH FLUORO;  Surgeon: Coralyn Helling, MD;  Location: [facility];  Service: Cardiopulmonary;   Laterality: Bilateral;     OB History    Gravida  2   Para  2   Term  2   Preterm  0   AB  0   Living  2     SAB  0   TAB  0   Ectopic  0   Multiple  0   Live Births              Family History  Problem Relation Age of Onset  . Cancer Father        lung  . COPD Father   . Early death Father   . Hypertension Sister   . Cancer Maternal Aunt        LUNG CANCER  . Other Neg Hx     Social History   Tobacco Use  . Smoking status: Former Smoker    Packs/day: 0.50    Years: 32.00    Pack years: 16.00    Types: Cigarettes    Quit date: 02/20/2017    Years since quitting: 3.5  . Smokeless tobacco: Never Used  . Tobacco comment: currently 1/2 ppd/ STATES SHE QUIT OCT 2017  Vaping Use  . Vaping Use: Never used  Substance Use Topics  . Alcohol use: No    Alcohol/week: 0.0 standard drinks  . Drug use: No    Home Medications Prior to Admission medications   Medication Sig Start Date End Date Taking? Authorizing Provider  acetaminophen-codeine (TYLENOL #4) 300-60 MG tablet Take 1 tablet by mouth every 4 (four) hours as needed for moderate pain. 05/18/18   Kerrin Champagne, MD  ALPRAZolam Prudy Feeler) 1 MG tablet Take 1 mg by mouth 4 (four) times daily.     [provider]  amphetamine-dextroamphetamine (ADDERALL XR) 25 MG 24 hr capsule Take 1 capsule by mouth 2 (two) times daily.  07/17/15   [provider]  cyclobenzaprine (FLEXERIL) 10 MG tablet Take 1 tablet (10 mg total) by mouth 2 (two) times daily as needed for muscle spasms. 12/13/18   Cristie Hem, PA-C  doxepin (SINEQUAN) 150 MG capsule TK 2 CS PO 1 HOUR B BED 03/30/18   [provider]  FLUoxetine (PROZAC) 40 MG capsule Take 40 mg by mouth 2 (two) times daily.     [provider]  HYDROcodone-acetaminophen (NORCO) 5-325 MG tablet Take 1-2 tablets by mouth daily as needed. 08/14/20   Cristie Hem, PA-C  ibuprofen (ADVIL,MOTRIN) 800 MG tablet Take 1 tablet (800 mg  total) by mouth 3 (three) times daily. 01/16/16   Danelle Berry, PA-C  lamoTRIgine (LAMICTAL) 200 MG tablet Take 200 mg by mouth 2 (two) times daily.  02/17/14   [provider]  LYRICA 200 MG capsule Take 1 capsule by mouth 2 (two) times daily. 07/23/15   [provider]  meloxicam (MOBIC) 7.5 MG tablet Take 1 tablet (7.5 mg total) by mouth daily for 30 doses. 08/22/20 09/21/20  Cristie Hem, PA-C  oxyCODONE-acetaminophen (PERCOCET) 5-325 MG tablet Take  1 tablet by mouth daily as needed for severe pain. 09/03/20   Tarry Kos, MD  predniSONE (STERAPRED UNI-PAK 21 TAB) 10 MG (21) TBPK tablet Patient states she is able to take oral steroids 08/14/20   Cristie Hem, PA-C  QUEtiapine (SEROQUEL) 200 MG tablet  04/29/18   [provider]  rizatriptan (MAXALT) 10 MG tablet Take 20 mg by mouth daily as needed for migraine. May repeat in 2 hours if needed    [provider]  traMADol (ULTRAM) 50 MG tablet Take 1-2 tablets (50-100 mg total) by mouth 2 (two) times daily as needed. 08/22/20   Cristie Hem, PA-C  VRAYLAR capsule TK ONE C PO QAM 04/24/18   [provider]  zolpidem (AMBIEN) 10 MG tablet Take 10 mg by mouth at bedtime. May take 1-1 tablets at hs prn 02/25/14   [provider]    Allergies    Cortisone acetate and Vicodin [hydrocodone-acetaminophen]  Review of Systems   Review of Systems  Constitutional: Negative for chills and fever.  Respiratory: Negative for shortness of breath.   Gastrointestinal: Negative for abdominal distention and abdominal pain.  Genitourinary: Positive for vaginal pain. Negative for dysuria and urgency.  Musculoskeletal: Negative for gait problem.  Neurological: Negative for weakness.  All other systems reviewed and are negative.   Physical Exam Updated Vital Signs BP 123/89 (BP Location: Right Arm)   Pulse 77   Temp 98.9 F (37.2 C) (Oral)   Resp 20   Ht 5\' 6"  (1.676 m)   Wt 69.4 kg   SpO2  98%   BMI 24.69 kg/m   Physical Exam Vitals and nursing note reviewed. Exam conducted with a chaperone present ).  Constitutional:      General: She is not in acute distress.    Appearance: She is well-developed. She is not diaphoretic.  HENT:     Head: Normocephalic and atraumatic.  Eyes:     General: No scleral icterus.       Right eye: No discharge.        Left eye: No discharge.     Conjunctiva/sclera: Conjunctivae normal.  Cardiovascular:     Rate and Rhythm: Normal rate.  Pulmonary:     Effort: Pulmonary effort is normal. No respiratory distress.     Breath sounds: No stridor.  Abdominal:     General: There is no distension.     Tenderness: There is no abdominal tenderness. There is no guarding.  Genitourinary:    Comments: GU exam limited to visual inspection with out bimanual or pelvic exam performed.  Mild prolapse visible inside the introitus. Prolapse does not extend past the vaginal opening.   Musculoskeletal:        General: No deformity.     Cervical back: Normal range of motion.  Skin:    General: Skin is warm and dry.  Neurological:     General: No focal deficit present.     Mental Status: She is alert. Mental status is at baseline.     Motor: No abnormal muscle tone.  Psychiatric:     Comments: Anxious      ED Results / Procedures / Treatments   Labs (all labs ordered are listed, but only abnormal results are displayed) Labs Reviewed - No data to display  EKG None  Radiology No results found.  Procedures Procedures (including critical care time)  Medications Ordered in ED Medications - No data to display  ED Course  I  have reviewed the triage vital signs and the nursing notes.  Pertinent labs & imaging results that were available during my care of the patient were reviewed by me and considered in my medical decision making (see chart for details).    MDM Rules/Calculators/A&P                         Patient is a 54 year old  woman who presents today for evaluation of a possible prolapse. Limited pelvic exam without bimanual or speculum exam shows prolapse however does not appear to extend past the introitus. Patient was bladder scanned without significant residual and appears to be able to fully empty her bladder. Recommended outpatient follow-up. No evidence of tissue necrosis or infection.  Patient declined urine testing.   Return precautions were discussed with patient who states their understanding.  At the time of discharge patient denied any unaddressed complaints or concerns.  Patient is agreeable for discharge home.  Note: Portions of this report may have been transcribed using voice recognition software. Every effort was made to ensure accuracy; however, inadvertent computerized transcription errors may be present   Final Clinical Impression(s) / ED Diagnoses Final diagnoses:  Female genital prolapse, unspecified type    Rx / DC Orders ED Discharge Orders    None       Norman Clay 09/12/20 2153    Charlynne Pander, MD 09/13/20 1452

## 2020-09-12 NOTE — ED Provider Notes (Signed)
MSE was initiated and I personally evaluated the patient and placed orders (if any) at  3:52 PM on September 12, 2020.  The patient appears stable so that the remainder of the MSE may be completed by another provider.  I was asked by triage staff to see patient to see if she would qualify for transfer to [facility].  Patient reports that yesterday she felt something coming out of her vagina when she was wiping.  She reports pain in the area.  She has a history of a hysterectomy.  She says that she has an appointment with her OB/GYN however that is not until the 24th.  She denies any history of similar.  Limited exam with out speculum with chaperone Maggie RN present shows prolapse, does not appear to be outside the introitus.  Patient will need further evaluation.   I confirmed with Joni Reining, APP at [facility] that patient is not appropriate for MSE in the ED than transfer to [facility].    Patient is advised that this is a limited exam primarily to determine if it is appropriate for transfer to [facility], we discussed the importance of staying for full evaluation and the possible risks of failing to do so.     Note: Portions of this report may have been transcribed using voice recognition software. Every effort was made to ensure accuracy; however, inadvertent computerized transcription errors may be present    Norman Clay 09/12/20 1555    Sabino Donovan, MD 09/12/20 2040

## 2020-09-12 NOTE — Discharge Instructions (Addendum)
As we discussed today please use gentle, unscented soap, laundry detergents and lotion.  I would recommend Dove or another similar sensitive brand. Please do not douche or use any irritating products such as scented or warming/cooling or tingling lube.  The vagina (inside part) is self cleaning and you should not attempt to clean or wash the inside part.   If you notice large amounts of bleeding, if you have fevers, are unable to urinate or have other concerns please seek additional medical care.

## 2020-09-12 NOTE — ED Triage Notes (Signed)
Pt reports she had a mesh surgery in 1994, reports her bladder is in a sling. Pt noticed she "felt something" when she was wiping after using the bathroom this afternoon. Pt states, "there is something down there coming out of me"

## 2020-09-12 NOTE — Telephone Encounter (Signed)
Pt called asking if she can be sent in some tramadol

## 2020-09-15 ENCOUNTER — Other Ambulatory Visit: Payer: Self-pay | Admitting: Physician Assistant

## 2020-09-15 MED ORDER — TRAMADOL HCL 50 MG PO TABS: 50.0000 mg | ORAL_TABLET | Freq: Two times a day (BID) | ORAL | 0 refills | Status: DC | PRN

## 2020-09-15 NOTE — Telephone Encounter (Signed)
Sent in

## 2020-09-19 DIAGNOSIS — M542 Cervicalgia: Secondary | ICD-10-CM | POA: Diagnosis not present

## 2020-09-19 DIAGNOSIS — F17210 Nicotine dependence, cigarettes, uncomplicated: Secondary | ICD-10-CM | POA: Diagnosis not present

## 2020-09-19 DIAGNOSIS — M5459 Other low back pain: Secondary | ICD-10-CM | POA: Diagnosis not present

## 2020-09-19 DIAGNOSIS — J984 Other disorders of lung: Secondary | ICD-10-CM | POA: Diagnosis not present

## 2020-09-19 DIAGNOSIS — M546 Pain in thoracic spine: Secondary | ICD-10-CM | POA: Diagnosis not present

## 2020-09-19 DIAGNOSIS — S39012A Strain of muscle, fascia and tendon of lower back, initial encounter: Secondary | ICD-10-CM | POA: Diagnosis not present

## 2020-09-19 DIAGNOSIS — R52 Pain, unspecified: Secondary | ICD-10-CM | POA: Diagnosis not present

## 2020-09-19 DIAGNOSIS — J449 Chronic obstructive pulmonary disease, unspecified: Secondary | ICD-10-CM | POA: Diagnosis not present

## 2020-09-19 DIAGNOSIS — G4489 Other headache syndrome: Secondary | ICD-10-CM | POA: Diagnosis not present

## 2020-09-19 DIAGNOSIS — S29012A Strain of muscle and tendon of back wall of thorax, initial encounter: Secondary | ICD-10-CM | POA: Diagnosis not present

## 2020-09-19 DIAGNOSIS — R918 Other nonspecific abnormal finding of lung field: Secondary | ICD-10-CM | POA: Diagnosis not present

## 2020-09-21 ENCOUNTER — Other Ambulatory Visit: Payer: Self-pay

## 2020-09-21 ENCOUNTER — Ambulatory Visit
Admission: RE | Admit: 2020-09-21 | Discharge: 2020-09-21 | Disposition: A | Discharge status: Home / Self Care | Payer: Medicare Other | Source: Ambulatory Visit | Attending: Orthopaedic Surgery | Admitting: Orthopaedic Surgery

## 2020-09-21 DIAGNOSIS — G8929 Other chronic pain: Secondary | ICD-10-CM

## 2020-09-21 DIAGNOSIS — M545 Low back pain, unspecified: Secondary | ICD-10-CM

## 2020-09-24 ENCOUNTER — Ambulatory Visit: Payer: Medicare Other | Admitting: Orthopaedic Surgery

## 2020-09-24 DIAGNOSIS — N952 Postmenopausal atrophic vaginitis: Secondary | ICD-10-CM | POA: Diagnosis not present

## 2020-09-30 ENCOUNTER — Encounter: Payer: Self-pay | Admitting: Orthopaedic Surgery

## 2020-09-30 ENCOUNTER — Other Ambulatory Visit: Payer: Self-pay

## 2020-09-30 ENCOUNTER — Ambulatory Visit (INDEPENDENT_AMBULATORY_CARE_PROVIDER_SITE_OTHER): Payer: Medicare Other | Admitting: Orthopaedic Surgery

## 2020-09-30 DIAGNOSIS — M5416 Radiculopathy, lumbar region: Secondary | ICD-10-CM | POA: Diagnosis not present

## 2020-09-30 MED ORDER — PREDNISONE 10 MG (21) PO TBPK: ORAL_TABLET | ORAL | 0 refills | Status: DC

## 2020-09-30 MED ORDER — TRAMADOL HCL 50 MG PO TABS: 50.0000 mg | ORAL_TABLET | Freq: Every day | ORAL | 0 refills | Status: DC | PRN
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
## 2020-10-01 ENCOUNTER — Other Ambulatory Visit: Payer: Self-pay

## 2020-10-01 DIAGNOSIS — M545 Low back pain, unspecified: Secondary | ICD-10-CM

## 2020-10-03 ENCOUNTER — Other Ambulatory Visit: Payer: Self-pay | Admitting: Physician Assistant

## 2020-10-03 ENCOUNTER — Telehealth: Payer: Self-pay

## 2020-10-03 ENCOUNTER — Telehealth: Payer: Self-pay | Admitting: Orthopaedic Surgery

## 2020-10-03 MED ORDER — HYDROCODONE-ACETAMINOPHEN 5-325 MG PO TABS
1.0000 | ORAL_TABLET | Freq: Every day | ORAL | 0 refills | Status: DC | PRN
Start: 2020-10-03 — End: 2020-10-21

## 2020-10-03 NOTE — Telephone Encounter (Signed)
Sent in one small rx of norco to be used sparingly

## 2020-10-03 NOTE — Telephone Encounter (Signed)
Pt has request Valium for her appt on 1/4.

## 2020-10-03 NOTE — Telephone Encounter (Signed)
Patient called stating she needs stronger medication then tramadol. Patient states tramadol is not touching her back pains. Also patient states her appt with Dr. Alvester Morin is not until Jan.2022. Please send pain medication to pharmacy on file. Patient phone number is [phone].

## 2020-10-03 NOTE — Telephone Encounter (Signed)
Called patient she is unavailable. Asked if he could tell her to return my call.  Just need to advise on message below.

## 2020-10-17 DIAGNOSIS — F3181 Bipolar II disorder: Secondary | ICD-10-CM | POA: Diagnosis not present

## 2020-10-21 ENCOUNTER — Other Ambulatory Visit: Payer: Self-pay | Admitting: Physician Assistant

## 2020-10-21 ENCOUNTER — Telehealth: Payer: Self-pay | Admitting: Orthopaedic Surgery

## 2020-10-21 MED ORDER — HYDROCODONE-ACETAMINOPHEN 5-325 MG PO TABS
1.0000 | ORAL_TABLET | Freq: Every day | ORAL | 0 refills | Status: DC | PRN
Start: 2020-10-21 — End: 2020-11-04

## 2020-10-21 NOTE — Telephone Encounter (Signed)
Please advise 

## 2020-10-21 NOTE — Telephone Encounter (Signed)
Sent in norco

## 2020-10-21 NOTE — Telephone Encounter (Signed)
Pt called stating she has a herniated disc in her back which is causing her a lot of pain and she would like to know if something can be sent in? Pt stated if Dr.Xu has any questions he can call her  [phone]

## 2020-10-22 ENCOUNTER — Other Ambulatory Visit: Payer: Self-pay | Admitting: Physical Medicine and Rehabilitation

## 2020-10-22 MED ORDER — DIAZEPAM 5 MG PO TABS: ORAL_TABLET | ORAL | 0 refills | Status: DC

## 2020-10-22 NOTE — Progress Notes (Signed)
Pre-procedure diazepam ordered for pre-operative anxiety.  

## 2020-10-22 NOTE — Telephone Encounter (Signed)
Done

## 2020-11-04 ENCOUNTER — Other Ambulatory Visit: Payer: Self-pay | Admitting: Physician Assistant

## 2020-11-04 ENCOUNTER — Telehealth: Payer: Self-pay

## 2020-11-04 ENCOUNTER — Ambulatory Visit (INDEPENDENT_AMBULATORY_CARE_PROVIDER_SITE_OTHER): Payer: Medicare Other | Admitting: Physical Medicine and Rehabilitation

## 2020-11-04 ENCOUNTER — Ambulatory Visit: Payer: Self-pay

## 2020-11-04 ENCOUNTER — Other Ambulatory Visit: Payer: Self-pay

## 2020-11-04 VITALS — BP 115/81 | HR 74

## 2020-11-04 DIAGNOSIS — M5116 Intervertebral disc disorders with radiculopathy, lumbar region: Secondary | ICD-10-CM

## 2020-11-04 DIAGNOSIS — M5126 Other intervertebral disc displacement, lumbar region: Secondary | ICD-10-CM

## 2020-11-04 DIAGNOSIS — M5416 Radiculopathy, lumbar region: Secondary | ICD-10-CM

## 2020-11-04 MED ORDER — HYDROCODONE-ACETAMINOPHEN 5-325 MG PO TABS: 1.0000 | ORAL_TABLET | Freq: Every day | ORAL | 0 refills | Status: DC | PRN

## 2020-11-04 MED ORDER — DEXAMETHASONE SODIUM PHOSPHATE 10 MG/ML IJ SOLN
15.0000 mg | Freq: Once | INTRAMUSCULAR | Status: AC
  Administered 2020-11-04: 15 mg

## 2020-11-04 NOTE — Telephone Encounter (Signed)
Please advise 

## 2020-11-04 NOTE — Progress Notes (Signed)
Pt state lower back pain. Pt state walking, standing and laying down makes the pain worse. Pt state she takes pain meds to help ease the pain.  Numeric Pain Rating Scale and Functional Assessment Average Pain 10   In the last MONTH (on 0-10 scale) has pain interfered with the following?  1. General activity like being  able to carry out your everyday physical activities such as walking, climbing stairs, carrying groceries, or moving a chair?  Rating(10)   +Driver, -BT, -Dye Allergies.

## 2020-11-04 NOTE — Procedures (Signed)
S1 Lumbosacral Transforaminal Epidural Steroid Injection - Sub-Pedicular Approach with Fluoroscopic Guidance   Patient: [name]      Date of Birth: 24-Feb-1966 MRN: [id number] PCP: Pearson Grippe, MD      Visit Date: 11/04/2020   Universal Protocol:    Date/Time: 01/04/223:16 PM  Consent Given By: the patient  Position:  PRONE  Additional Comments: Vital signs were monitored before and after the procedure. Patient was prepped and draped in the usual sterile fashion. The correct patient, procedure, and site was verified.   Injection Procedure Details:  Procedure Site One Meds Administered:  Meds ordered this encounter  Medications  . dexamethasone (DECADRON) injection 15 mg    Laterality: Bilateral  Location/Site:  S1 Foramen   Needle size: 22 ga.  Needle type: Spinal  Needle Placement: Transforaminal  Findings:   -Comments: Excellent flow of contrast along the nerve, nerve root and into the epidural space.  Epidurogram: Contrast epidurogram showed no nerve root cut off or restricted flow pattern.  Procedure Details: After squaring off the sacral end-plate to get a true AP view, the C-arm was positioned so that the best possible view of the S1 foramen was visualized. The soft tissues overlying this structure were infiltrated with 2-3 ml. of 1% Lidocaine without Epinephrine.    The spinal needle was inserted toward the target using a "trajectory" view along the fluoroscope beam.  Under AP and lateral visualization, the needle was advanced so it did not puncture dura. Biplanar projections were used to confirm position. Aspiration was confirmed to be negative for CSF and/or blood. A 1-2 ml. volume of Isovue-250 was injected and flow of contrast was noted at each level. Radiographs were obtained for documentation purposes.   After attaining the desired flow of contrast documented above, a 0.5 to 1.0 ml test dose of 0.25% Marcaine was injected into each respective  transforaminal space.  The patient was observed for 90 seconds post injection.  After no sensory deficits were reported, and normal lower extremity motor function was noted,   the above injectate was administered so that equal amounts of the injectate were placed at each foramen (level) into the transforaminal epidural space.   Additional Comments:  The patient tolerated the procedure well Dressing: Band-Aid with 2 x 2 sterile gauze    Post-procedure details: Patient was observed during the procedure. Post-procedure instructions were reviewed.  Patient left the clinic in stable condition.

## 2020-11-04 NOTE — Telephone Encounter (Signed)
Patient came in she is requesting rx refill for hydrocodone. [phone]

## 2020-11-04 NOTE — Telephone Encounter (Signed)
We can send one more, but if needs more after injection will need to get from pcp or we can refer to pain clinic.  Unfortunately, we cannot write chronic narcotics

## 2020-11-04 NOTE — Telephone Encounter (Signed)
I called patient and advised. 

## 2020-11-04 NOTE — Progress Notes (Signed)
ARNOLD DEPINTO - 55 y.o. female MRN [id number]  Date of birth: 10-20-1966  Office Visit Note: Visit Date: 11/04/2020 PCP: Pearson Grippe, MD Referred by: Pearson Grippe, MD  Subjective: Chief Complaint  Patient presents with  . Lower Back - Pain   HPI:  OLAYINKA GATHERS is a 55 y.o. female who comes in today at the request of Dr. Glee Arvin for planned Bilateral S1-2 Lumbar epidural steroid injection with fluoroscopic guidance.  The patient has failed conservative care including home exercise, medications, time and activity modification.  This injection will be diagnostic and hopefully therapeutic.  Please see requesting physician notes for further details and justification.  MRI reviewed with images and spine model.  MRI reviewed in the note below.   ROS Otherwise per HPI.  Assessment & Plan: Visit Diagnoses:    ICD-10-CM   1. Lumbar radiculopathy  M54.16 XR C-ARM NO REPORT    Epidural Steroid injection    dexamethasone (DECADRON) injection 15 mg  2. Radiculopathy due to lumbar intervertebral disc disorder  M51.16   3. Lumbar herniated disc  M51.26     Plan: No additional findings.   Meds & Orders:  Meds ordered this encounter  Medications  . dexamethasone (DECADRON) injection 15 mg    Orders Placed This Encounter  Procedures  . XR C-ARM NO REPORT  . Epidural Steroid injection    Follow-up: Return if symptoms worsen or fail to improve, for May want to consider discectomy.   Procedures: No procedures performed  S1 Lumbosacral Transforaminal Epidural Steroid Injection - Sub-Pedicular Approach with Fluoroscopic Guidance   Patient: PARISA PINELA      Date of Birth: January 21, 1966 MRN: [id number] PCP: Pearson Grippe, MD      Visit Date: 11/04/2020   Universal Protocol:    Date/Time: 01/04/223:16 PM  Consent Given By: the patient  Position:  PRONE  Additional Comments: Vital signs were monitored before and after the procedure. Patient was prepped and draped in the  usual sterile fashion. The correct patient, procedure, and site was verified.   Injection Procedure Details:  Procedure Site One Meds Administered:  Meds ordered this encounter  Medications  . dexamethasone (DECADRON) injection 15 mg    Laterality: Bilateral  Location/Site:  S1 Foramen   Needle size: 22 ga.  Needle type: Spinal  Needle Placement: Transforaminal  Findings:   -Comments: Excellent flow of contrast along the nerve, nerve root and into the epidural space.  Epidurogram: Contrast epidurogram showed no nerve root cut off or restricted flow pattern.  Procedure Details: After squaring off the sacral end-plate to get a true AP view, the C-arm was positioned so that the best possible view of the S1 foramen was visualized. The soft tissues overlying this structure were infiltrated with 2-3 ml. of 1% Lidocaine without Epinephrine.    The spinal needle was inserted toward the target using a "trajectory" view along the fluoroscope beam.  Under AP and lateral visualization, the needle was advanced so it did not puncture dura. Biplanar projections were used to confirm position. Aspiration was confirmed to be negative for CSF and/or blood. A 1-2 ml. volume of Isovue-250 was injected and flow of contrast was noted at each level. Radiographs were obtained for documentation purposes.   After attaining the desired flow of contrast documented above, a 0.5 to 1.0 ml test dose of 0.25% Marcaine was injected into each respective transforaminal space.  The patient was observed for 90 seconds post injection.  After no sensory deficits  were reported, and normal lower extremity motor function was noted,   the above injectate was administered so that equal amounts of the injectate were placed at each foramen (level) into the transforaminal epidural space.   Additional Comments:  The patient tolerated the procedure well Dressing: Band-Aid with 2 x 2 sterile gauze    Post-procedure  details: Patient was observed during the procedure. Post-procedure instructions were reviewed.  Patient left the clinic in stable condition.     Clinical History: MRI LUMBAR SPINE WITHOUT CONTRAST  TECHNIQUE: Multiplanar, multisequence MR imaging of the lumbar spine was performed. No intravenous contrast was administered.  COMPARISON:  Prior radiograph from 08/14/2020 as well as previous MRI from 12/14/2016.  FINDINGS: Segmentation: Examination degraded by motion artifact.  Standard.  Lowest well-formed disc space labeled the L5-S1 level.  Alignment: Physiologic with preservation of the normal lumbar lordosis. No listhesis or static subluxation.  Vertebrae: Vertebral body height maintained without acute or chronic fracture. Bone marrow signal intensity within normal limits. No discrete or worrisome osseous lesions. No abnormal marrow edema.  Conus medullaris and cauda equina: Conus extends to the L2 level. Conus and cauda equina appear normal.  Paraspinal and other soft tissues: Paraspinous soft tissues within normal limits. Few scattered tiny subcentimeter benign appearing cyst noted within the right kidney. Visualized visceral structures otherwise grossly unremarkable on this motion degraded exam.  Disc levels:  L1-2:  Unremarkable.  L2-3: Mild annular bulge with disc desiccation. Disc bulge slightly eccentric to the left with a probable left extraforaminal annular fissure (series 13, image 17). No spinal stenosis. Foramina remain patent.  L3-4: Disc desiccation with minimal disc bulge. Disc bulge slightly eccentric to the left with a left extraforaminal annular fissure (series 13, image 23). No spinal stenosis. Foramina remain patent.  L4-5: Disc desiccation with minimal annular disc bulge. Mild facet hypertrophy. No significant canal or lateral recess stenosis. Foramina remain patent.  L5-S1: Central disc extrusion with inferior migration,  slightly coursing to the left (series 10, images 35, 36, 37). Disc material closely approximates and/or contacts both of the descending S1 nerve roots, greater on the left. Disc material is also in close proximity to the left S2 nerve root. No frank neural impingement or displacement. Superimposed moderate left with mild right facet hypertrophy. Mild narrowing of the lateral recesses. Central canal remains patent. No foraminal stenosis.  IMPRESSION: 1. New central disc extrusion with inferior migration at L5-S1, closely approximating and/or potentially irritating either of the descending S1 nerve roots, greater on the left. 2. Mild disc bulge with left extraforaminal annular fissures at L2-3 and L3-4, but no significant stenosis or neural impingement. 3. Lower lumbar facet hypertrophy at L4-5 and L5-S1.   Electronically Signed   By: Jeannine Boga M.D.   On: 09/22/2020 04:26     Objective:  VS:  HT:    WT:   BMI:     BP:115/81  HR:74bpm  TEMP: ( )  RESP:  Physical Exam Vitals and nursing note reviewed.  Constitutional:      General: She is not in acute distress.    Appearance: Normal appearance. She is not ill-appearing.  HENT:     Head: Normocephalic and atraumatic.     Right Ear: External ear normal.     Left Ear: External ear normal.  Eyes:     Extraocular Movements: Extraocular movements intact.  Cardiovascular:     Rate and Rhythm: Normal rate.     Pulses: Normal pulses.  Pulmonary:  Effort: Pulmonary effort is normal. No respiratory distress.  Abdominal:     General: There is no distension.     Palpations: Abdomen is soft.  Musculoskeletal:        General: Tenderness present.     Cervical back: Neck supple.     Right lower leg: No edema.     Left lower leg: No edema.     Comments: Patient has good distal strength with no pain over the greater trochanters.  No clonus or focal weakness.  Skin:    Findings: No erythema, lesion or rash.   Neurological:     General: No focal deficit present.     Mental Status: She is alert and oriented to person, place, and time.     Sensory: No sensory deficit.     Motor: No weakness or abnormal muscle tone.     Coordination: Coordination normal.  Psychiatric:        Mood and Affect: Mood normal.        Behavior: Behavior normal.      Imaging: No results found.

## 2020-11-17 DIAGNOSIS — F3181 Bipolar II disorder: Secondary | ICD-10-CM | POA: Diagnosis not present

## 2020-11-20 DIAGNOSIS — F429 Obsessive-compulsive disorder, unspecified: Secondary | ICD-10-CM | POA: Diagnosis not present

## 2020-11-20 DIAGNOSIS — F3181 Bipolar II disorder: Secondary | ICD-10-CM | POA: Diagnosis not present

## 2020-11-20 DIAGNOSIS — F4001 Agoraphobia with panic disorder: Secondary | ICD-10-CM | POA: Diagnosis not present

## 2020-12-11 DIAGNOSIS — H0102A Squamous blepharitis right eye, upper and lower eyelids: Secondary | ICD-10-CM | POA: Diagnosis not present

## 2020-12-11 DIAGNOSIS — H2513 Age-related nuclear cataract, bilateral: Secondary | ICD-10-CM | POA: Diagnosis not present

## 2020-12-11 DIAGNOSIS — H0102B Squamous blepharitis left eye, upper and lower eyelids: Secondary | ICD-10-CM | POA: Diagnosis not present

## 2020-12-17 DIAGNOSIS — F3181 Bipolar II disorder: Secondary | ICD-10-CM | POA: Diagnosis not present

## 2020-12-19 ENCOUNTER — Encounter: Payer: Self-pay | Admitting: Orthopaedic Surgery

## 2020-12-19 ENCOUNTER — Other Ambulatory Visit: Payer: Self-pay

## 2020-12-19 ENCOUNTER — Ambulatory Visit (INDEPENDENT_AMBULATORY_CARE_PROVIDER_SITE_OTHER): Payer: Medicare Other | Admitting: Orthopaedic Surgery

## 2020-12-19 DIAGNOSIS — M545 Low back pain, unspecified: Secondary | ICD-10-CM

## 2020-12-19 MED ORDER — METHOCARBAMOL 500 MG PO TABS: 500.0000 mg | ORAL_TABLET | Freq: Two times a day (BID) | ORAL | 0 refills | Status: DC | PRN

## 2020-12-19 MED ORDER — PREDNISONE 10 MG (21) PO TBPK: ORAL_TABLET | ORAL | 0 refills | Status: DC

## 2020-12-19 NOTE — Progress Notes (Signed)
Office Visit Note   Patient: [name]           Date of Birth: [birth year]           MRN: [id number] Visit Date: 12/19/2020              Requested by: Pearson Grippe, MD [address] [place],  [place] [address] PCP: Pearson Grippe, MD   Assessment & Plan: Visit Diagnoses:  1. Low back pain, unspecified back pain laterality, unspecified chronicity, unspecified whether sciatica present     Plan: Impression is chronic right-sided low back pain and radiculopathy.  At this point, the patient would like to be evaluated for surgery.  I have referred her to Dr. Otelia Sergeant as he has operated on her neck in the past.  She will follow up with him.  Follow-up with [place] as needed.  Follow-Up Instructions: Return if symptoms worsen or fail to improve.   Orders:  No orders of the defined types were placed in this encounter.  Meds ordered this encounter  Medications  . predniSONE (STERAPRED UNI-PAK 21 TAB) 10 MG (21) TBPK tablet    Sig: Take as directed    Dispense:  21 tablet    Refill:  0  . methocarbamol (ROBAXIN) 500 MG tablet    Sig: Take 1 tablet (500 mg total) by mouth 2 (two) times daily as needed.    Dispense:  20 tablet    Refill:  0      Procedures: No procedures performed   Clinical Data: No additional findings.   Subjective: Chief Complaint  Patient presents with  . Lower Back - Pain    HPI patient is a pleasant 55 year old female who comes in today with recurrent right-sided low back pain and right lower extremity radiculopathy.  MRI from November of this past year a disc extrusion with inferior migration of L5-S1 with S1 nerve root irritation.  She was seen by Dr. Alvester Morin where bilateral foraminal injection at S1 was performed.  She notes mild and temporary relief following the injection.  She is here today with continued right-sided low back pain and right lower extremity radiculopathy.  She notes that the pain is constant and that there are no specific  aggravators.  She has been taking steroids, Tylenol and using muscle rubs without significant relief of symptoms.  She notes that she is starting to get slight weakness to the right lower extremity.  No bowel or bladder change or saddle paresthesias.  Review of Systems as detailed in HPI.  All others reviewed and are negative.   Objective: Vital Signs: There were no vitals taken for this visit.  Physical Exam well-developed well-nourished female no acute distress.  Alert oriented x3.  Ortho Exam lumbar spine exam shows no spinous tenderness.  She does have right-sided paraspinous tenderness to the right lower level.  Increased pain with lumbar flexion, extension and rotation.  Positive straight leg raise.  No focal weakness.  She is neurovascular intact distally.  Specialty Comments:  No specialty comments available.  Imaging: No new imaging   PMFS History: Patient Active Problem List   Diagnosis Date Noted  . Primary osteoarthritis of left knee 02/07/2017  . Postlaminectomy syndrome, cervical region 12/09/2015  . Cervical spondylosis with radiculopathy 10/04/2015  . Spondylosis, cervical, with myelopathy 10/03/2015  . Secondary adhesive capsulitis of left shoulder 10/03/2015  . Obesity 09/21/2015  . Nausea with vomiting   . SOB (shortness of breath)   . COPD with  acute exacerbation (HCC) 11/14/2014  . Hypokalemia 11/14/2014  . Acute respiratory failure (HCC) 11/14/2014  . COPD exacerbation (HCC) 11/14/2014  . Abnormal EKG 11/14/2014  . COPD GOLD 0/ still smoking  06/17/2014  . Leg swelling 06/17/2014  . Fatigue 06/17/2014  . Cigarette smoker 01/20/2014  . Pulmonary infiltrate 01/11/2014  . MIXED INCONTINENCE URGE AND STRESS 09/19/2007  . Bipolar disorder (HCC) 06/26/2007  . FIBROMYALGIA 06/26/2007  . INSOMNIA 06/26/2007   Past Medical History:  Diagnosis Date  . Abscess    rectal  . Anxiety   . Arthritis    "back; hips" (11/14/2014)  . Bipolar disorder (HCC)   .  Chronic back pain    "all over"  . Chronic bronchitis (HCC)    "I get it q yr" (11/14/2014)  . COPD (chronic obstructive pulmonary disease) (HCC)   . Depression   . Dyspnea    W/ EXERTION   . Emphysema lung (HCC)   . Endometriosis   . Fibromyalgia   . History of kidney stones   . ILD (interstitial lung disease) (HCC) 11/2013  . Migraine    "3 in the last 2 wks" (11/14/2014)  . Ovarian cyst   . Pulmonary infiltrates     Family History  Problem Relation Age of Onset  . Cancer Father        lung  . COPD Father   . Early death Father   . Hypertension Sister   . Cancer Maternal Aunt        LUNG CANCER  . Other Neg Hx     Past Surgical History:  Procedure Laterality Date  . ANTERIOR CERVICAL DECOMP/DISCECTOMY FUSION  10/03/2015   Procedure: ANTERIOR CERVICAL DISCECTOMY  AND FUSION C5-6 WITH PLATE AND SCREWS, LOCAL AND ALLOGRAFT BONE GRAFT;  Surgeon: Kerrin Champagne, MD;  Location: [facility];  Service: Orthopedics;;  . APPENDECTOMY  1993  . BREAST BIOPSY Left 2008  . BREAST LUMPECTOMY Left 2008   "benign"  . CYST EXCISION PERINEAL Left 01/2012   Hattie Perch 02/15/2012  . EXAMINATION UNDER ANESTHESIA  02/15/2012   Procedure: EXAM UNDER ANESTHESIA;  Surgeon: Maisie Fus A. Cornett, MD;  Location: [facility];  Service: General;  Laterality: Left;  Excision of perineal cyst  . FOOT SURGERY Left 08/17/2016   gastroc recession and plantar fascia release  . LAPAROSCOPIC CHOLECYSTECTOMY  08/2007  . LAPAROSCOPIC LYSIS OF ADHESIONS  06/2010   Hattie Perch 06/18/2010  . LEFT OOPHORECTOMY Left 08/2013  . OVARIAN CYST REMOVAL  X 2-3  . SHOULDER CLOSED REDUCTION Left 10/03/2015   Procedure: CLOSED MANIPULATION SHOULDER CLOSED  AND INJECTION WITH LOCAL ANESTHETIC;  Surgeon: Kerrin Champagne, MD;  Location: [facility];  Service: Orthopedics;  Laterality: Left;  . TRANSOBTURATOR SLING  09/2008   Transobturator mid urethral sling, cystoscopy, laparoscopy w/LOA/notes 03/04/2011  . TUBAL LIGATION  1993  . VAGINAL  HYSTERECTOMY  1993  . VIDEO BRONCHOSCOPY Bilateral 02/15/2014   Procedure: VIDEO BRONCHOSCOPY WITH FLUORO;  Surgeon: Coralyn Helling, MD;  Location: [facility];  Service: Cardiopulmonary;  Laterality: Bilateral;   Social History   Occupational History  . Not on file  Tobacco Use  . Smoking status: Former Smoker    Packs/day: 0.50    Years: 32.00    Pack years: 16.00    Types: Cigarettes    Quit date: 02/20/2017    Years since quitting: 3.8  . Smokeless tobacco: Never Used  . Tobacco comment: currently 1/2 ppd/ STATES SHE QUIT OCT 2017  Vaping  Use  . Vaping Use: Never used  Substance and Sexual Activity  . Alcohol use: No    Alcohol/week: 0.0 standard drinks  . Drug use: No  . Sexual activity: Not Currently    Birth control/protection: Surgical

## 2021-01-02 ENCOUNTER — Encounter: Payer: Self-pay | Admitting: Specialist

## 2021-01-02 ENCOUNTER — Other Ambulatory Visit: Payer: Self-pay

## 2021-01-02 ENCOUNTER — Ambulatory Visit (INDEPENDENT_AMBULATORY_CARE_PROVIDER_SITE_OTHER): Payer: Medicare Other | Admitting: Specialist

## 2021-01-02 ENCOUNTER — Ambulatory Visit: Payer: Self-pay

## 2021-01-02 VITALS — BP 110/69 | HR 63 | Ht 66.0 in | Wt 154.6 lb

## 2021-01-02 DIAGNOSIS — M533 Sacrococcygeal disorders, not elsewhere classified: Secondary | ICD-10-CM

## 2021-01-02 DIAGNOSIS — M545 Low back pain, unspecified: Secondary | ICD-10-CM | POA: Diagnosis not present

## 2021-01-02 DIAGNOSIS — M4726 Other spondylosis with radiculopathy, lumbar region: Secondary | ICD-10-CM

## 2021-01-02 DIAGNOSIS — M5416 Radiculopathy, lumbar region: Secondary | ICD-10-CM

## 2021-01-02 MED ORDER — METHYLPREDNISOLONE 4 MG PO TABS: ORAL_TABLET | ORAL | 0 refills | Status: AC

## 2021-01-02 NOTE — Progress Notes (Signed)
Office Visit Note   Patient: [name]           Date of Birth: 1966/10/26           MRN: [id number] Visit Date: 01/02/2021              Requested by: Pearson Grippe, MD [address] [place],  [place] [address] PCP: Pearson Grippe, MD   Assessment & Plan: Visit Diagnoses:  1. Low back pain, unspecified back pain laterality, unspecified chronicity, unspecified whether sciatica present   2. Lumbar radiculopathy   3. Other spondylosis with radiculopathy, lumbar region   4. Sacroiliac joint disease     Plan: Avoid bending, stooping and avoid lifting weights greater than 10 lbs. Avoid prolong standing and walking. Avoid frequent bending and stooping  No lifting greater than 10 lbs. May use ice or moist heat for pain. Weight loss is of benefit. Handicap license is approved. Dr. [place] Blas secretary/Assistant will call to arrange for lumbar facet and SI joint injection   Follow-Up Instructions: Return in about 4 weeks (around 01/30/2021).   Orders:  Orders Placed This Encounter  Procedures  . XR Lumb Spine Flex&Ext Only   No orders of the defined types were placed in this encounter.     Procedures: No procedures performed   Clinical Data: No additional findings.   Subjective: Chief Complaint  Patient presents with  . Lower Back - Pain    55 year old female with history of ACDF 08/2015 for spondylosis with shoulder manipulation. She is right handed and has been experiencing pain in the mid lower back for the last years, but is worsening. She has pain that is an "8" of 10 and is present constantly. Sitting, standing, bending and stooping and twisting increase the pain. She is able to grocery shop and tends to lean on the cart with grocery shopping, making mainly short trips. No bowel or bladder difficulties, does experience numbness intermittantly into the tops of the feet. She is able to reach her feet and wears slid on shoes. She has pain with sweeping,  vacuuming and mopping are painful. Unable to walk. Has COPD and it is stable and she sees Dr. Craige Cotta with [facility].  She is still smoking.    Review of Systems  Constitutional: Positive for unexpected weight change (weight loss about 20 lbs over 6 months.). Negative for activity change, appetite change, chills, diaphoresis, fatigue and fever.  HENT: Negative.  Negative for congestion, dental problem, drooling, ear discharge, ear pain, facial swelling, hearing loss, mouth sores, nosebleeds, postnasal drip, rhinorrhea, sinus pressure, sinus pain, sneezing, sore throat, tinnitus, trouble swallowing and voice change.   Eyes: Negative for photophobia, pain, discharge, redness, itching and visual disturbance.  Respiratory: Negative.  Negative for apnea, cough, choking, chest tightness, shortness of breath, wheezing and stridor.   Cardiovascular: Negative.  Negative for chest pain, palpitations and leg swelling.  Gastrointestinal: Negative.  Negative for abdominal distention, abdominal pain, anal bleeding, blood in stool, constipation, diarrhea, nausea, rectal pain and vomiting.  Endocrine: Positive for cold intolerance. Negative for heat intolerance, polydipsia, polyphagia and polyuria.  Genitourinary: Negative.  Negative for difficulty urinating, dyspareunia, dysuria, enuresis, flank pain, frequency and genital sores.  Musculoskeletal: Positive for back pain and gait problem. Negative for arthralgias, joint swelling, myalgias, neck pain and neck stiffness.  Skin: Negative for color change, pallor, rash and wound.  Allergic/Immunologic: Negative.  Negative for environmental allergies, food allergies and immunocompromised state.  Neurological: Positive for weakness,  numbness and headaches. Negative for dizziness, tremors, seizures, syncope, facial asymmetry, speech difficulty and light-headedness.  Hematological: Negative.  Negative for adenopathy. Does not bruise/bleed easily (does bruise easily).   Psychiatric/Behavioral: Negative.  Negative for agitation, behavioral problems, confusion, decreased concentration, dysphoric mood, hallucinations, self-injury, sleep disturbance and suicidal ideas. The patient is not nervous/anxious and is not hyperactive.      Objective: Vital Signs: BP 110/69   Pulse 63   Ht 5\' 6"  (1.676 m)   Wt 154 lb 9.6 oz (70.1 kg)   BMI 24.95 kg/m   Physical Exam Constitutional:      Appearance: She is well-developed and well-nourished.  HENT:     Head: Normocephalic and atraumatic.  Eyes:     Extraocular Movements: EOM normal.     Pupils: Pupils are equal, round, and reactive to light.  Pulmonary:     Effort: Pulmonary effort is normal.     Breath sounds: Normal breath sounds.  Abdominal:     General: Bowel sounds are normal.     Palpations: Abdomen is soft.  Musculoskeletal:        General: Normal range of motion.     Cervical back: Normal range of motion and neck supple.     Lumbar back: Negative right straight leg raise test and negative left straight leg raise test.  Skin:    General: Skin is warm and dry.  Neurological:     Mental Status: She is alert and oriented to person, place, and time.  Psychiatric:        Mood and Affect: Mood and affect normal.        Behavior: Behavior normal.        Thought Content: Thought content normal.        Judgment: Judgment normal.     Back Exam   Tenderness  The patient is experiencing tenderness in the lumbar.  Muscle Strength  Right Quadriceps:  5/5  Left Quadriceps:  5/5  Right Hamstrings:  5/5  Left Hamstrings:  5/5   Tests  Straight leg raise right: negative Straight leg raise left: negative  Reflexes  Patellar: 0/4 Achilles: 0/4  Other  Toe walk: normal Heel walk: normal Sensation: normal  Comments:  Forward bending 12 inches from floor Pain with figure 4 of hips and with palpationof the lumbosacral and SI areas.       Specialty Comments:  No specialty comments  available.  Imaging: XR Lumb Spine Flex&Ext Only  Result Date: 01/02/2021 AP and lateral flexion and extension radiographs demonstrate minimal disc narrowing L4-5, remaining disc height is well maintained. Right SI with narrowing greater than left. CT scan from 2015 with spondylosis of the lower lumbar spine and L-S spine and bilateral SI joints.     PMFS History: Patient Active Problem List   Diagnosis Date Noted  . Spondylosis, cervical, with myelopathy 10/03/2015    Priority: High  . Primary osteoarthritis of left knee 02/07/2017  . Postlaminectomy syndrome, cervical region 12/09/2015  . Cervical spondylosis with radiculopathy 10/04/2015  . Secondary adhesive capsulitis of left shoulder 10/03/2015  . Obesity 09/21/2015  . Nausea with vomiting   . SOB (shortness of breath)   . COPD with acute exacerbation (HCC) 11/14/2014  . Hypokalemia 11/14/2014  . Acute respiratory failure (HCC) 11/14/2014  . COPD exacerbation (HCC) 11/14/2014  . Abnormal EKG 11/14/2014  . COPD GOLD 0/ still smoking  06/17/2014  . Leg swelling 06/17/2014  . Fatigue 06/17/2014  . Cigarette smoker 01/20/2014  .  Pulmonary infiltrate 01/11/2014  . MIXED INCONTINENCE URGE AND STRESS 09/19/2007  . Bipolar disorder (HCC) 06/26/2007  . FIBROMYALGIA 06/26/2007  . INSOMNIA 06/26/2007   Past Medical History:  Diagnosis Date  . Abscess    rectal  . Anxiety   . Arthritis    "back; hips" (11/14/2014)  . Bipolar disorder (HCC)   . Chronic back pain    "all over"  . Chronic bronchitis (HCC)    "I get it q yr" (11/14/2014)  . COPD (chronic obstructive pulmonary disease) (HCC)   . Depression   . Dyspnea    W/ EXERTION   . Emphysema lung (HCC)   . Endometriosis   . Fibromyalgia   . History of kidney stones   . ILD (interstitial lung disease) (HCC) 11/2013  . Migraine    "3 in the last 2 wks" (11/14/2014)  . Ovarian cyst   . Pulmonary infiltrates     Family History  Problem Relation Age of Onset  . Cancer  Father        lung  . COPD Father   . Early death Father   . Hypertension Sister   . Cancer Maternal Aunt        LUNG CANCER  . Other Neg Hx     Past Surgical History:  Procedure Laterality Date  . ANTERIOR CERVICAL DECOMP/DISCECTOMY FUSION  10/03/2015   Procedure: ANTERIOR CERVICAL DISCECTOMY  AND FUSION C5-6 WITH PLATE AND SCREWS, LOCAL AND ALLOGRAFT BONE GRAFT;  Surgeon: Kerrin Champagne, MD;  Location: [facility];  Service: Orthopedics;;  . APPENDECTOMY  1993  . BREAST BIOPSY Left 2008  . BREAST LUMPECTOMY Left 2008   "benign"  . CYST EXCISION PERINEAL Left 01/2012   Hattie Perch 02/15/2012  . EXAMINATION UNDER ANESTHESIA  02/15/2012   Procedure: EXAM UNDER ANESTHESIA;  Surgeon: Maisie Fus A. Cornett, MD;  Location: [facility];  Service: General;  Laterality: Left;  Excision of perineal cyst  . FOOT SURGERY Left 08/17/2016   gastroc recession and plantar fascia release  . LAPAROSCOPIC CHOLECYSTECTOMY  08/2007  . LAPAROSCOPIC LYSIS OF ADHESIONS  06/2010   Hattie Perch 06/18/2010  . LEFT OOPHORECTOMY Left 08/2013  . OVARIAN CYST REMOVAL  X 2-3  . SHOULDER CLOSED REDUCTION Left 10/03/2015   Procedure: CLOSED MANIPULATION SHOULDER CLOSED  AND INJECTION WITH LOCAL ANESTHETIC;  Surgeon: Kerrin Champagne, MD;  Location: [facility];  Service: Orthopedics;  Laterality: Left;  . TRANSOBTURATOR SLING  09/2008   Transobturator mid urethral sling, cystoscopy, laparoscopy w/LOA/notes 03/04/2011  . TUBAL LIGATION  1993  . VAGINAL HYSTERECTOMY  1993  . VIDEO BRONCHOSCOPY Bilateral 02/15/2014   Procedure: VIDEO BRONCHOSCOPY WITH FLUORO;  Surgeon: Coralyn Helling, MD;  Location: [facility];  Service: Cardiopulmonary;  Laterality: Bilateral;   Social History   Occupational History  . Not on file  Tobacco Use  . Smoking status: Former Smoker    Packs/day: 0.50    Years: 32.00    Pack years: 16.00    Types: Cigarettes    Quit date: 02/20/2017    Years since quitting: 3.8  . Smokeless tobacco: Never Used  .  Tobacco comment: currently 1/2 ppd/ STATES SHE QUIT OCT 2017  Vaping Use  . Vaping Use: Never used  Substance and Sexual Activity  . Alcohol use: No    Alcohol/week: 0.0 standard drinks  . Drug use: No  . Sexual activity: Not Currently    Birth control/protection: Surgical

## 2021-01-02 NOTE — Patient Instructions (Signed)
Avoid bending, stooping and avoid lifting weights greater than 10 lbs. Avoid prolong standing and walking. Avoid frequent bending and stooping  No lifting greater than 10 lbs. May use ice or moist heat for pain. Weight loss is of benefit. Handicap license is approved. Dr. [place] Blas secretary/Assistant will call to arrange for lumbar facet and SI joint injection

## 2021-01-06 ENCOUNTER — Telehealth: Payer: Self-pay | Admitting: Physical Medicine and Rehabilitation

## 2021-01-06 NOTE — Telephone Encounter (Signed)
Called pt and sch 3/28

## 2021-01-06 NOTE — Telephone Encounter (Signed)
Patient called requesting an appt for back injections. Also patient is asking for a return phone call from [place]. Patient phone number is [phone].

## 2021-01-20 DIAGNOSIS — F3181 Bipolar II disorder: Secondary | ICD-10-CM | POA: Diagnosis not present

## 2021-01-26 ENCOUNTER — Encounter: Payer: Self-pay | Admitting: Physical Medicine and Rehabilitation

## 2021-01-26 ENCOUNTER — Ambulatory Visit (INDEPENDENT_AMBULATORY_CARE_PROVIDER_SITE_OTHER): Payer: Medicare Other | Admitting: Physical Medicine and Rehabilitation

## 2021-01-26 ENCOUNTER — Ambulatory Visit: Payer: Self-pay

## 2021-01-26 ENCOUNTER — Other Ambulatory Visit: Payer: Self-pay

## 2021-01-26 VITALS — BP 108/70 | HR 64

## 2021-01-26 DIAGNOSIS — M47816 Spondylosis without myelopathy or radiculopathy, lumbar region: Secondary | ICD-10-CM | POA: Diagnosis not present

## 2021-01-26 DIAGNOSIS — M461 Sacroiliitis, not elsewhere classified: Secondary | ICD-10-CM | POA: Diagnosis not present

## 2021-01-26 MED ORDER — BETAMETHASONE SOD PHOS & ACET 6 (3-3) MG/ML IJ SUSP
6.0000 mg | Freq: Once | INTRAMUSCULAR | Status: AC
  Administered 2021-01-26: 6 mg

## 2021-01-26 NOTE — Progress Notes (Signed)
MAURISHA MONGEAU - 55 y.o. female MRN [id number]  Date of birth: 1966-08-13  Office Visit Note: Visit Date: 01/26/2021 PCP: Pearson Grippe, MD Referred by: Pearson Grippe, MD  Subjective: Chief Complaint  Patient presents with  . Lower Back - Pain  . Left Leg - Pain   HPI:  TANGALA WIEGERT is a 55 y.o. female who comes in today At the request of Dr. Vira Browns for diagnostic hopefully therapeutic right L5-S1 facet joint block as well as right sacroiliac joint block.  She has disc herniation more left-sided.  Prior epidural injection without relief diagnostically.  She has tried and failed all manner of conservative care including physical therapy and medication management.  This has been going on quite some time without any relief.  It does impact her daily living.  She rates her pain as an 8 out of 10.  It is on the right side and does travel into the thigh to some degree.  No paresthesia.  Again she uses over-the-counter pain medications heat and ice etc.  ROS Otherwise per HPI.  Assessment & Plan: Visit Diagnoses:    ICD-10-CM   1. Spondylosis without myelopathy or radiculopathy, lumbar region  M47.816 XR C-ARM NO REPORT    Facet Injection    betamethasone acetate-betamethasone sodium phosphate (CELESTONE) injection 6 mg    Sacroiliac Joint Inj  2. Sacroiliitis (HCC)  M46.1 Sacroiliac Joint Inj    Plan: No additional findings.   Meds & Orders:  Meds ordered this encounter  Medications  . betamethasone acetate-betamethasone sodium phosphate (CELESTONE) injection 6 mg    Orders Placed This Encounter  Procedures  . Facet Injection  . Sacroiliac Joint Inj  . XR C-ARM NO REPORT    Follow-up: Return for visit to requesting physician as needed.   Procedures: Sacroiliac Joint Inj on 01/26/2021 3:33 PM Indications: pain and diagnostic evaluation Details: 22 G 3.5 in needle, fluoroscopy-guided posterior approach Medications: 2 mL bupivacaine 0.5 %; 40 mg methylPREDNISolone acetate  40 MG/ML Outcome: tolerated well, no immediate complications  Sacroiliac Joint Intra-Articular Injection - Posterior Approach with Fluoroscopic Guidance   Position: PRONE  Additional Comments: Vital signs were monitored before and after the procedure. Patient was prepped and draped in the usual sterile fashion. The correct patient, procedure, and site was verified.   Injection Procedure Details:   Location/Site:  Sacroiliac joint  Needle size: 3.5 in Spinal Needle  Needle type: Spinal  Needle Placement: Intra-articular  Findings:  -Comments: There was excellent flow of contrast producing a partial arthrogram of the sacroiliac joint.   Procedure Details: Starting with a 90 degree vertical and midline orientation the fluoroscope was tilted cranially 20 to 25 degrees and the target area of the inferior most part of the SI joint on the side mentioned above was visualized.  The soft tissues overlying this target were infiltrated with 4 ml. of 1% Lidocaine without Epinephrine. A #22 gauge spinal needle was inserted perpendicular to the fluoroscope table and advanced into the posterior inferior joint space using fluoroscopic guidance.  Position in the joint space was confirmed by obtaining a partial arthrogram using a 2 ml. volume of Isovue-250 contrast agent. After negative aspirate for gross pus or blood, the injectate was delivered to the joint. Radiographs were obtained for documentation purposes.   Additional Comments:   Dressing: Bandaid    Post-procedure details: Patient was observed during the procedure. Post-procedure instructions were reviewed.  Patient left the clinic in stable condition.    There  was excellent flow of contrast producing a partial arthrogram of the sacroiliac joint.  Procedure, treatment alternatives, risks and benefits explained, specific risks discussed. Consent was given by the patient. Immediately prior to procedure a time out was called to  verify the correct patient, procedure, equipment, support staff and site/side marked as required. Patient was prepped and draped in the usual sterile fashion.      Lumbar Facet Joint Intra-Articular Injection(s) with Fluoroscopic Guidance  Patient: HENRINE HAYTER      Date of Birth: 02-12-1966 MRN: [id number] PCP: Pearson Grippe, MD      Visit Date: 01/26/2021   Universal Protocol:    Date/Time: 01/26/2021  Consent Given By: the patient  Position: PRONE   Additional Comments: Vital signs were monitored before and after the procedure. Patient was prepped and draped in the usual sterile fashion. The correct patient, procedure, and site was verified.   Injection Procedure Details:  Procedure Site One Meds Administered:  Meds ordered this encounter  Medications  . betamethasone acetate-betamethasone sodium phosphate (CELESTONE) injection 6 mg     Laterality: Right  Location/Site:  L5-S1  Needle size: 22 guage  Needle type: Spinal  Needle Placement: Articular  Findings:  -Comments: Excellent flow of contrast producing a partial arthrogram.  Procedure Details: The fluoroscope beam is vertically oriented in AP, and the inferior recess is visualized beneath the lower pole of the inferior apophyseal process, which represents the target point for needle insertion. When direct visualization is difficult the target point is located at the medial projection of the vertebral pedicle. The region overlying each aforementioned target is locally anesthetized with a 1 to 2 ml. volume of 1% Lidocaine without Epinephrine.   The spinal needle was inserted into each of the above mentioned facet joints using biplanar fluoroscopic guidance. A 0.25 to 0.5 ml. volume of Isovue-250 was injected and a partial facet joint arthrogram was obtained. A single spot film was obtained of the resulting arthrogram.    One to 1.25 ml of the steroid/anesthetic solution was then injected into each of the facet  joints noted above.   Additional Comments:  The patient tolerated the procedure well Dressing: 2 x 2 sterile gauze and Band-Aid    Post-procedure details: Patient was observed during the procedure. Post-procedure instructions were reviewed.  Patient left the clinic in stable condition.      Clinical History: MRI LUMBAR SPINE WITHOUT CONTRAST  TECHNIQUE: Multiplanar, multisequence MR imaging of the lumbar spine was performed. No intravenous contrast was administered.  COMPARISON:  Prior radiograph from 08/14/2020 as well as previous MRI from 12/14/2016.  FINDINGS: Segmentation: Examination degraded by motion artifact.  Standard.  Lowest well-formed disc space labeled the L5-S1 level.  Alignment: Physiologic with preservation of the normal lumbar lordosis. No listhesis or static subluxation.  Vertebrae: Vertebral body height maintained without acute or chronic fracture. Bone marrow signal intensity within normal limits. No discrete or worrisome osseous lesions. No abnormal marrow edema.  Conus medullaris and cauda equina: Conus extends to the L2 level. Conus and cauda equina appear normal.  Paraspinal and other soft tissues: Paraspinous soft tissues within normal limits. Few scattered tiny subcentimeter benign appearing cyst noted within the right kidney. Visualized visceral structures otherwise grossly unremarkable on this motion degraded exam.  Disc levels:  L1-2:  Unremarkable.  L2-3: Mild annular bulge with disc desiccation. Disc bulge slightly eccentric to the left with a probable left extraforaminal annular fissure (series 13, image 17). No spinal stenosis. Foramina remain patent.  L3-4: Disc desiccation with minimal disc bulge. Disc bulge slightly eccentric to the left with a left extraforaminal annular fissure (series 13, image 23). No spinal stenosis. Foramina remain patent.  L4-5: Disc desiccation with minimal annular disc bulge. Mild  facet hypertrophy. No significant canal or lateral recess stenosis. Foramina remain patent.  L5-S1: Central disc extrusion with inferior migration, slightly coursing to the left (series 10, images 35, 36, 37). Disc material closely approximates and/or contacts both of the descending S1 nerve roots, greater on the left. Disc material is also in close proximity to the left S2 nerve root. No frank neural impingement or displacement. Superimposed moderate left with mild right facet hypertrophy. Mild narrowing of the lateral recesses. Central canal remains patent. No foraminal stenosis.  IMPRESSION: 1. New central disc extrusion with inferior migration at L5-S1, closely approximating and/or potentially irritating either of the descending S1 nerve roots, greater on the left. 2. Mild disc bulge with left extraforaminal annular fissures at L2-3 and L3-4, but no significant stenosis or neural impingement. 3. Lower lumbar facet hypertrophy at L4-5 and L5-S1.   Electronically Signed   By: Rise Mu M.D.   On: 09/22/2020 04:26     Objective:  VS:  HT:    WT:   BMI:     BP:108/70  HR:64bpm  TEMP: ( )  RESP:  Physical Exam Vitals and nursing note reviewed.  Constitutional:      General: She is not in acute distress.    Appearance: Normal appearance. She is not ill-appearing.  HENT:     Head: Normocephalic and atraumatic.     Right Ear: External ear normal.     Left Ear: External ear normal.  Eyes:     Extraocular Movements: Extraocular movements intact.  Cardiovascular:     Rate and Rhythm: Normal rate.     Pulses: Normal pulses.  Pulmonary:     Effort: Pulmonary effort is normal. No respiratory distress.  Abdominal:     General: There is no distension.     Palpations: Abdomen is soft.  Musculoskeletal:        General: Tenderness present.     Cervical back: Neck supple.     Right lower leg: No edema.     Left lower leg: No edema.     Comments: Patient has  good distal strength with no pain over the greater trochanters.  No clonus or focal weakness. Patient somewhat slow to rise from a seated position to full extension.  There is concordant low back pain with facet loading and lumbar spine extension rotation.  There are no definitive trigger points but the patient is somewhat tender across the lower back and PSIS.  There is no pain with hip rotation.   Skin:    Findings: No erythema, lesion or rash.  Neurological:     General: No focal deficit present.     Mental Status: She is alert and oriented to person, place, and time.     Sensory: No sensory deficit.     Motor: No weakness or abnormal muscle tone.     Coordination: Coordination normal.  Psychiatric:        Mood and Affect: Mood normal.        Behavior: Behavior normal.      Imaging: XR C-ARM NO REPORT  Result Date: 01/26/2021 Please see Notes tab for imaging impression.

## 2021-01-26 NOTE — Patient Instructions (Signed)

## 2021-01-26 NOTE — Progress Notes (Signed)
Pt state lower back pain that travels down her right leg. Pt state walking, standing and sitting makes the pain worse. Pt state she take over the counter pain meds and heat, ice pads to help ease her pain. Pt has hx of inj on 11/04/20 pt state it didn't work.  Numeric Pain Rating Scale and Functional Assessment Average Pain 8   In the last MONTH (on 0-10 scale) has pain interfered with the following?  1. General activity like being  able to carry out your everyday physical activities such as walking, climbing stairs, carrying groceries, or moving a chair?  Rating(8)   +Driver, -BT, -Dye Allergies.

## 2021-01-27 MED ORDER — METHYLPREDNISOLONE ACETATE 40 MG/ML IJ SUSP
40.0000 mg | INTRAMUSCULAR | Status: AC | PRN
  Administered 2021-01-26: 40 mg via INTRA_ARTICULAR

## 2021-01-27 MED ORDER — BUPIVACAINE HCL 0.5 % IJ SOLN
2.0000 mL | INTRAMUSCULAR | Status: AC | PRN
  Administered 2021-01-26: 2 mL via INTRA_ARTICULAR

## 2021-01-27 NOTE — Procedures (Signed)
Lumbar Facet Joint Intra-Articular Injection(s) with Fluoroscopic Guidance  Patient: [name]      Date of Birth: 13-Nov-1965 MRN: [id number] PCP: Pearson Grippe, MD      Visit Date: 01/26/2021   Universal Protocol:    Date/Time: 01/26/2021  Consent Given By: the patient  Position: PRONE   Additional Comments: Vital signs were monitored before and after the procedure. Patient was prepped and draped in the usual sterile fashion. The correct patient, procedure, and site was verified.   Injection Procedure Details:  Procedure Site One Meds Administered:  Meds ordered this encounter  Medications  . betamethasone acetate-betamethasone sodium phosphate (CELESTONE) injection 6 mg     Laterality: Right  Location/Site:  L5-S1  Needle size: 22 guage  Needle type: Spinal  Needle Placement: Articular  Findings:  -Comments: Excellent flow of contrast producing a partial arthrogram.  Procedure Details: The fluoroscope beam is vertically oriented in AP, and the inferior recess is visualized beneath the lower pole of the inferior apophyseal process, which represents the target point for needle insertion. When direct visualization is difficult the target point is located at the medial projection of the vertebral pedicle. The region overlying each aforementioned target is locally anesthetized with a 1 to 2 ml. volume of 1% Lidocaine without Epinephrine.   The spinal needle was inserted into each of the above mentioned facet joints using biplanar fluoroscopic guidance. A 0.25 to 0.5 ml. volume of Isovue-250 was injected and a partial facet joint arthrogram was obtained. A single spot film was obtained of the resulting arthrogram.    One to 1.25 ml of the steroid/anesthetic solution was then injected into each of the facet joints noted above.   Additional Comments:  The patient tolerated the procedure well Dressing: 2 x 2 sterile gauze and Band-Aid    Post-procedure  details: Patient was observed during the procedure. Post-procedure instructions were reviewed.  Patient left the clinic in stable condition.

## 2021-01-30 ENCOUNTER — Ambulatory Visit (INDEPENDENT_AMBULATORY_CARE_PROVIDER_SITE_OTHER): Payer: Medicare Other | Admitting: Specialist

## 2021-01-30 ENCOUNTER — Encounter: Payer: Self-pay | Admitting: Specialist

## 2021-01-30 ENCOUNTER — Other Ambulatory Visit: Payer: Self-pay

## 2021-01-30 VITALS — BP 119/80 | HR 76 | Ht 66.0 in | Wt 155.0 lb

## 2021-01-30 DIAGNOSIS — M533 Sacrococcygeal disorders, not elsewhere classified: Secondary | ICD-10-CM | POA: Diagnosis not present

## 2021-01-30 DIAGNOSIS — M5137 Other intervertebral disc degeneration, lumbosacral region: Secondary | ICD-10-CM | POA: Diagnosis not present

## 2021-01-30 DIAGNOSIS — M5416 Radiculopathy, lumbar region: Secondary | ICD-10-CM | POA: Diagnosis not present

## 2021-01-30 DIAGNOSIS — M4726 Other spondylosis with radiculopathy, lumbar region: Secondary | ICD-10-CM | POA: Diagnosis not present

## 2021-01-30 NOTE — Progress Notes (Signed)
Office Visit Note   Patient: [name]           Date of Birth: 06-05-1966           MRN: [id number] Visit Date: 01/30/2021              Requested by: Pearson Grippe, MD [address] [place],  [place] [address] PCP: Pearson Grippe, MD   Assessment & Plan: Visit Diagnoses:  1. Sacroiliac joint disease   2. Lumbar radiculopathy   3. Other spondylosis with radiculopathy, lumbar region   4. Disc disease, degenerative, lumbar or lumbosacral     Plan: Avoid frequent bending and stooping  No lifting greater than 10 lbs. May use ice or moist heat for pain. Weight loss is of benefit. Best medication for lumbar disc disease is arthritis medications like motrin, celebrex and naprosyn. Exercise is important to improve your indurance and does allow people to function better inspite of back pain. Will try PT and meds to decrease nerve irritaton. The disc protrusion does not appear severe enough to warrant surgery.    Follow-Up Instructions: No follow-ups on file.   Orders:  No orders of the defined types were placed in this encounter.  No orders of the defined types were placed in this encounter.     Procedures: No procedures performed   Clinical Data: No additional findings.   Subjective: Chief Complaint  Patient presents with  . Lower Back - Follow-up    She had a Rt L5-S1 Facet injection on 01/26/21, not sure if it has helped any yet, but she is aware that it is early yet,     55 year old female with back and right buttock and posterior thigh and leg pain. MRI with disc protrusion at the L5-S1 level with central component more left nerve compression than right. Right SI and right L5-S1 facet blocks without relief.  She is still painful.    Review of Systems  Constitutional: Negative.   HENT: Negative.   Eyes: Negative.   Respiratory: Negative.   Cardiovascular: Negative.   Gastrointestinal: Negative.   Endocrine: Negative.   Genitourinary:  Negative.   Musculoskeletal: Negative.   Skin: Negative.   Allergic/Immunologic: Negative.   Neurological: Negative.   Hematological: Negative.   Psychiatric/Behavioral: Negative.      Objective: Vital Signs: BP 119/80 (BP Location: Left Arm, Patient Position: Sitting)   Pulse 76   Ht 5\' 6"  (1.676 m)   Wt 155 lb (70.3 kg)   BMI 25.02 kg/m   Physical Exam Constitutional:      Appearance: She is well-developed.  HENT:     Head: Normocephalic and atraumatic.  Eyes:     Pupils: Pupils are equal, round, and reactive to light.  Pulmonary:     Effort: Pulmonary effort is normal.     Breath sounds: Normal breath sounds.  Abdominal:     General: Bowel sounds are normal.     Palpations: Abdomen is soft.  Musculoskeletal:     Cervical back: Normal range of motion and neck supple.  Skin:    General: Skin is warm and dry.  Neurological:     Mental Status: She is alert and oriented to person, place, and time.  Psychiatric:        Behavior: Behavior normal.        Thought Content: Thought content normal.        Judgment: Judgment normal.     Back Exam   Tenderness  The patient is experiencing tenderness in the lumbar.  Range of Motion  Extension: abnormal  Flexion: abnormal  Lateral bend right: abnormal  Lateral bend left: abnormal  Rotation right: abnormal  Rotation left: abnormal   Muscle Strength  Right Quadriceps:  5/5  Left Quadriceps:  5/5  Right Hamstrings:  5/5  Left Hamstrings:  5/5   Reflexes  Patellar: 0/4 Achilles: 0/4  Other  Toe walk: normal Heel walk: normal Sensation: normal Gait: normal  Erythema: no back redness Scars: absent      Specialty Comments:  No specialty comments available.  Imaging: No results found.   PMFS History: Patient Active Problem List   Diagnosis Date Noted  . Spondylosis, cervical, with myelopathy 10/03/2015    Priority: High  . Primary osteoarthritis of left knee 02/07/2017  . Postlaminectomy  syndrome, cervical region 12/09/2015  . Cervical spondylosis with radiculopathy 10/04/2015  . Secondary adhesive capsulitis of left shoulder 10/03/2015  . Obesity 09/21/2015  . Nausea with vomiting   . SOB (shortness of breath)   . COPD with acute exacerbation (HCC) 11/14/2014  . Hypokalemia 11/14/2014  . Acute respiratory failure (HCC) 11/14/2014  . COPD exacerbation (HCC) 11/14/2014  . Abnormal EKG 11/14/2014  . COPD GOLD 0/ still smoking  06/17/2014  . Leg swelling 06/17/2014  . Fatigue 06/17/2014  . Cigarette smoker 01/20/2014  . Pulmonary infiltrate 01/11/2014  . MIXED INCONTINENCE URGE AND STRESS 09/19/2007  . Bipolar disorder (HCC) 06/26/2007  . FIBROMYALGIA 06/26/2007  . INSOMNIA 06/26/2007   Past Medical History:  Diagnosis Date  . Abscess    rectal  . Anxiety   . Arthritis    "back; hips" (11/14/2014)  . Bipolar disorder (HCC)   . Chronic back pain    "all over"  . Chronic bronchitis (HCC)    "I get it q yr" (11/14/2014)  . COPD (chronic obstructive pulmonary disease) (HCC)   . Depression   . Dyspnea    W/ EXERTION   . Emphysema lung (HCC)   . Endometriosis   . Fibromyalgia   . History of kidney stones   . ILD (interstitial lung disease) (HCC) 11/2013  . Migraine    "3 in the last 2 wks" (11/14/2014)  . Ovarian cyst   . Pulmonary infiltrates     Family History  Problem Relation Age of Onset  . Cancer Father        lung  . COPD Father   . Early death Father   . Hypertension Sister   . Cancer Maternal Aunt        LUNG CANCER  . Other Neg Hx     Past Surgical History:  Procedure Laterality Date  . ANTERIOR CERVICAL DECOMP/DISCECTOMY FUSION  10/03/2015   Procedure: ANTERIOR CERVICAL DISCECTOMY  AND FUSION C5-6 WITH PLATE AND SCREWS, LOCAL AND ALLOGRAFT BONE GRAFT;  Surgeon: Kerrin Champagne, MD;  Location: [facility] OR;  Service: Orthopedics;;  . APPENDECTOMY  1993  . BREAST BIOPSY Left 2008  . BREAST LUMPECTOMY Left 2008   "benign"  . CYST EXCISION PERINEAL  Left 01/2012   Hattie Perch 02/15/2012  . EXAMINATION UNDER ANESTHESIA  02/15/2012   Procedure: EXAM UNDER ANESTHESIA;  Surgeon: Maisie Fus A. Cornett, MD;  Location: [facility];  Service: General;  Laterality: Left;  Excision of perineal cyst  . FOOT SURGERY Left 08/17/2016   gastroc recession and plantar fascia release  . LAPAROSCOPIC CHOLECYSTECTOMY  08/2007  . LAPAROSCOPIC LYSIS OF ADHESIONS  06/2010   Hattie Perch 06/18/2010  .  LEFT OOPHORECTOMY Left 08/2013  . OVARIAN CYST REMOVAL  X 2-3  . SHOULDER CLOSED REDUCTION Left 10/03/2015   Procedure: CLOSED MANIPULATION SHOULDER CLOSED  AND INJECTION WITH LOCAL ANESTHETIC;  Surgeon: Kerrin Champagne, MD;  Location: [facility] OR;  Service: Orthopedics;  Laterality: Left;  . TRANSOBTURATOR SLING  09/2008   Transobturator mid urethral sling, cystoscopy, laparoscopy w/LOA/notes 03/04/2011  . TUBAL LIGATION  1993  . VAGINAL HYSTERECTOMY  1993  . VIDEO BRONCHOSCOPY Bilateral 02/15/2014   Procedure: VIDEO BRONCHOSCOPY WITH FLUORO;  Surgeon: Coralyn Helling, MD;  Location: [facility];  Service: Cardiopulmonary;  Laterality: Bilateral;   Social History   Occupational History  . Not on file  Tobacco Use  . Smoking status: Former Smoker    Packs/day: 0.50    Years: 32.00    Pack years: 16.00    Types: Cigarettes    Quit date: 02/20/2017    Years since quitting: 3.9  . Smokeless tobacco: Never Used  . Tobacco comment: currently 1/2 ppd/ STATES SHE QUIT OCT 2017  Vaping Use  . Vaping Use: Never used  Substance and Sexual Activity  . Alcohol use: No    Alcohol/week: 0.0 standard drinks  . Drug use: No  . Sexual activity: Not Currently    Birth control/protection: Surgical

## 2021-01-30 NOTE — Patient Instructions (Signed)
Plan: Avoid frequent bending and stooping  No lifting greater than 10 lbs. May use ice or moist heat for pain. Weight loss is of benefit. Best medication for lumbar disc disease is arthritis medications like motrin, celebrex and naprosyn. Exercise is important to improve your indurance and does allow people to function better inspite of back pain. Will try PT and meds to decrease nerve irritaton. The disc protrusion does not appear severe enough to warrant surgery.

## 2021-02-03 DIAGNOSIS — K219 Gastro-esophageal reflux disease without esophagitis: Secondary | ICD-10-CM | POA: Diagnosis not present

## 2021-02-03 DIAGNOSIS — R7309 Other abnormal glucose: Secondary | ICD-10-CM | POA: Diagnosis not present

## 2021-02-03 DIAGNOSIS — E785 Hyperlipidemia, unspecified: Secondary | ICD-10-CM | POA: Diagnosis not present

## 2021-02-10 DIAGNOSIS — J449 Chronic obstructive pulmonary disease, unspecified: Secondary | ICD-10-CM | POA: Diagnosis not present

## 2021-02-10 DIAGNOSIS — E785 Hyperlipidemia, unspecified: Secondary | ICD-10-CM | POA: Diagnosis not present

## 2021-02-10 DIAGNOSIS — M5030 Other cervical disc degeneration, unspecified cervical region: Secondary | ICD-10-CM | POA: Diagnosis not present

## 2021-02-10 DIAGNOSIS — K219 Gastro-esophageal reflux disease without esophagitis: Secondary | ICD-10-CM | POA: Diagnosis not present

## 2021-02-25 ENCOUNTER — Ambulatory Visit: Payer: Medicare Other | Attending: Specialist

## 2021-04-16 DIAGNOSIS — F3181 Bipolar II disorder: Secondary | ICD-10-CM | POA: Diagnosis not present

## 2021-05-19 DIAGNOSIS — F902 Attention-deficit hyperactivity disorder, combined type: Secondary | ICD-10-CM | POA: Diagnosis not present

## 2021-05-19 DIAGNOSIS — F3342 Major depressive disorder, recurrent, in full remission: Secondary | ICD-10-CM | POA: Diagnosis not present

## 2021-05-19 DIAGNOSIS — F4001 Agoraphobia with panic disorder: Secondary | ICD-10-CM | POA: Diagnosis not present

## 2021-05-19 DIAGNOSIS — F4312 Post-traumatic stress disorder, chronic: Secondary | ICD-10-CM | POA: Diagnosis not present

## 2021-05-20 DIAGNOSIS — F3181 Bipolar II disorder: Secondary | ICD-10-CM | POA: Diagnosis not present

## 2021-07-17 DIAGNOSIS — F3181 Bipolar II disorder: Secondary | ICD-10-CM | POA: Diagnosis not present

## 2021-08-12 DIAGNOSIS — E559 Vitamin D deficiency, unspecified: Secondary | ICD-10-CM | POA: Diagnosis not present

## 2021-08-12 DIAGNOSIS — K219 Gastro-esophageal reflux disease without esophagitis: Secondary | ICD-10-CM | POA: Diagnosis not present

## 2021-08-12 DIAGNOSIS — R7309 Other abnormal glucose: Secondary | ICD-10-CM | POA: Diagnosis not present

## 2021-08-12 DIAGNOSIS — R946 Abnormal results of thyroid function studies: Secondary | ICD-10-CM | POA: Diagnosis not present

## 2021-08-12 DIAGNOSIS — Z0000 Encounter for general adult medical examination without abnormal findings: Secondary | ICD-10-CM | POA: Diagnosis not present

## 2021-08-12 DIAGNOSIS — E785 Hyperlipidemia, unspecified: Secondary | ICD-10-CM | POA: Diagnosis not present

## 2021-08-18 DIAGNOSIS — F429 Obsessive-compulsive disorder, unspecified: Secondary | ICD-10-CM | POA: Diagnosis not present

## 2021-08-18 DIAGNOSIS — F4001 Agoraphobia with panic disorder: Secondary | ICD-10-CM | POA: Diagnosis not present

## 2021-08-18 DIAGNOSIS — F3181 Bipolar II disorder: Secondary | ICD-10-CM | POA: Diagnosis not present

## 2021-08-18 DIAGNOSIS — F3342 Major depressive disorder, recurrent, in full remission: Secondary | ICD-10-CM | POA: Diagnosis not present

## 2021-08-19 DIAGNOSIS — E785 Hyperlipidemia, unspecified: Secondary | ICD-10-CM | POA: Diagnosis not present

## 2021-08-19 DIAGNOSIS — Z0000 Encounter for general adult medical examination without abnormal findings: Secondary | ICD-10-CM | POA: Diagnosis not present

## 2021-08-19 DIAGNOSIS — K219 Gastro-esophageal reflux disease without esophagitis: Secondary | ICD-10-CM | POA: Diagnosis not present

## 2021-08-19 DIAGNOSIS — E559 Vitamin D deficiency, unspecified: Secondary | ICD-10-CM | POA: Diagnosis not present
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
## 2021-10-06 DIAGNOSIS — F3181 Bipolar II disorder: Secondary | ICD-10-CM | POA: Diagnosis not present

## 2021-11-10 DIAGNOSIS — F3181 Bipolar II disorder: Secondary | ICD-10-CM | POA: Diagnosis not present

## 2021-11-26 DIAGNOSIS — L723 Sebaceous cyst: Secondary | ICD-10-CM | POA: Diagnosis not present

## 2021-12-09 DIAGNOSIS — F3181 Bipolar II disorder: Secondary | ICD-10-CM | POA: Diagnosis not present

## 2022-01-06 DIAGNOSIS — F3342 Major depressive disorder, recurrent, in full remission: Secondary | ICD-10-CM | POA: Diagnosis not present

## 2022-05-11 ENCOUNTER — Telehealth: Payer: Self-pay | Admitting: Physical Medicine and Rehabilitation

## 2022-05-11 NOTE — Telephone Encounter (Signed)
Pt called requesting to set an appt for back injection. Please call pt at [phone].

## 2022-05-18 ENCOUNTER — Ambulatory Visit: Payer: Medicare Other | Admitting: Physical Medicine and Rehabilitation

## 2022-05-18 ENCOUNTER — Telehealth: Payer: Self-pay | Admitting: Physical Medicine and Rehabilitation

## 2022-05-18 NOTE — Telephone Encounter (Signed)
Patient called needing to cancel her appointment. Patient is feeling better

## 2022-05-24 ENCOUNTER — Ambulatory Visit (INDEPENDENT_AMBULATORY_CARE_PROVIDER_SITE_OTHER): Payer: 59

## 2022-05-24 ENCOUNTER — Ambulatory Visit (HOSPITAL_COMMUNITY)
Admission: EM | Admit: 2022-05-24 | Discharge: 2022-05-24 | Disposition: A | Discharge status: Home / Self Care | Payer: 59 | Attending: Emergency Medicine | Admitting: Emergency Medicine

## 2022-05-24 ENCOUNTER — Encounter (HOSPITAL_COMMUNITY): Payer: Self-pay | Admitting: Emergency Medicine

## 2022-05-24 DIAGNOSIS — R42 Dizziness and giddiness: Secondary | ICD-10-CM

## 2022-05-24 DIAGNOSIS — S0591XA Unspecified injury of right eye and orbit, initial encounter: Secondary | ICD-10-CM

## 2022-05-24 DIAGNOSIS — W19XXXA Unspecified fall, initial encounter: Secondary | ICD-10-CM

## 2022-05-24 MED ORDER — MECLIZINE HCL 12.5 MG PO TABS: 12.5000 mg | ORAL_TABLET | Freq: Three times a day (TID) | ORAL | 0 refills | Status: DC | PRN

## 2022-05-24 NOTE — Discharge Instructions (Signed)
Unknown cause of your dizziness however at this time neurologically there are no abnormalities and your dizziness that seems consistent with vertigo  Take meclizine 3 times daily as needed to help manage symptoms  Ensure that you are changing positions slowly waiting at least 10 seconds between movements to help stabilize the body  Ensure that you are getting adequate rest and fluid intake as dehydration and fatigue will exacerbate your symptoms  Please follow-up about your dizziness at your upcoming primary care appointment  At any point if you become dizzy and you fall again please go to the nearest emergency department for a a complete work-up and evaluation  Your x-ray of your eyes pending, we will call you for any concerning results

## 2022-05-24 NOTE — ED Triage Notes (Signed)
Patient states that she fell Thursday forward hitting her head, no tripping or anything.  Patient is still having some dizziness, right eye swelling and bruising.  Patient has taken some Ibuprofen for the pain.

## 2022-05-24 NOTE — ED Provider Notes (Signed)
[facility]    CSN: [id number] Arrival date & time: 05/24/22  1055      History   Chief Complaint Chief Complaint  Patient presents with   Fall    HPI NOU CHARD is a 56 y.o. female.   Patient presents for evaluation of dizziness beginning 5 days ago.  Symptoms began while she was standing in her kitchen eating when she became dizzy causing her to fall and hit the right side of her head and right thigh, denies loss of consciousness.  Dizziness is described as the room spinning, worsened when standing, changes in positions and quick movements.  Endorses some mild itching to the bilateral ears a few days prior to symptoms beginning but no ear drainage, decreased hearing, ear pain present.  Endorses a knot to the right side of the forehead and bruising and swelling to the right eye.  Denies visual disturbance .  Has attempted use of ibuprofen which has been ineffective.  Symptoms have never occurred before.   Past Medical History:  Diagnosis Date   Abscess    rectal   Anxiety    Arthritis    "back; hips" (11/14/2014)   Bipolar disorder (HCC)    Chronic back pain    "all over"   Chronic bronchitis (HCC)    "I get it q yr" (11/14/2014)   COPD (chronic obstructive pulmonary disease) (HCC)    Depression    Dyspnea    W/ EXERTION    Emphysema lung (HCC)    Endometriosis    Fibromyalgia    History of kidney stones    ILD (interstitial lung disease) (HCC) 11/2013   Migraine    "3 in the last 2 wks" (11/14/2014)   Ovarian cyst    Pulmonary infiltrates     Patient Active Problem List   Diagnosis Date Noted   Primary osteoarthritis of left knee 02/07/2017   Postlaminectomy syndrome, cervical region 12/09/2015   Cervical spondylosis with radiculopathy 10/04/2015   Spondylosis, cervical, with myelopathy 10/03/2015   Secondary adhesive capsulitis of left shoulder 10/03/2015   Obesity 09/21/2015   Nausea with vomiting    SOB (shortness of breath)    COPD with  acute exacerbation (HCC) 11/14/2014   Hypokalemia 11/14/2014   Acute respiratory failure (HCC) 11/14/2014   COPD exacerbation (HCC) 11/14/2014   Abnormal EKG 11/14/2014   COPD GOLD 0/ still smoking  06/17/2014   Leg swelling 06/17/2014   Fatigue 06/17/2014   Cigarette smoker 01/20/2014   Pulmonary infiltrate 01/11/2014   MIXED INCONTINENCE URGE AND STRESS 09/19/2007   Bipolar disorder (HCC) 06/26/2007   FIBROMYALGIA 06/26/2007   INSOMNIA 06/26/2007    Past Surgical History:  Procedure Laterality Date   ANTERIOR CERVICAL DECOMP/DISCECTOMY FUSION  10/03/2015   Procedure: ANTERIOR CERVICAL DISCECTOMY  AND FUSION C5-6 WITH PLATE AND SCREWS, LOCAL AND ALLOGRAFT BONE GRAFT;  Surgeon: Kerrin Champagne, MD;  Location: [facility];  Service: Orthopedics;;   APPENDECTOMY  1993   BREAST BIOPSY Left 2008   BREAST LUMPECTOMY Left 2008   "benign"   CYST EXCISION PERINEAL Left 01/2012   Hattie Perch 02/15/2012   EXAMINATION UNDER ANESTHESIA  02/15/2012   Procedure: EXAM UNDER ANESTHESIA;  Surgeon: Maisie Fus A. Cornett, MD;  Location: [facility];  Service: General;  Laterality: Left;  Excision of perineal cyst   FOOT SURGERY Left 08/17/2016   gastroc recession and plantar fascia release   LAPAROSCOPIC CHOLECYSTECTOMY  08/2007   LAPAROSCOPIC LYSIS OF ADHESIONS  06/2010   /  notes 06/18/2010   LEFT OOPHORECTOMY Left 08/2013   OVARIAN CYST REMOVAL  X 2-3   SHOULDER CLOSED REDUCTION Left 10/03/2015   Procedure: CLOSED MANIPULATION SHOULDER CLOSED  AND INJECTION WITH LOCAL ANESTHETIC;  Surgeon: Kerrin Champagne, MD;  Location: [facility];  Service: Orthopedics;  Laterality: Left;   TRANSOBTURATOR SLING  09/2008   Transobturator mid urethral sling, cystoscopy, laparoscopy w/LOA/notes 03/04/2011   TUBAL LIGATION  1993   VAGINAL HYSTERECTOMY  1993   VIDEO BRONCHOSCOPY Bilateral 02/15/2014   Procedure: VIDEO BRONCHOSCOPY WITH FLUORO;  Surgeon: Coralyn Helling, MD;  Location: [facility];  Service: Cardiopulmonary;   Laterality: Bilateral;    OB History     Gravida  2   Para  2   Term  2   Preterm  0   AB  0   Living  2      SAB  0   IAB  0   Ectopic  0   Multiple  0   Live Births               Home Medications    Prior to Admission medications   Medication Sig Start Date End Date Taking? Authorizing Provider  acetaminophen-codeine (TYLENOL #4) 300-60 MG tablet Take 1 tablet by mouth every 4 (four) hours as needed for moderate pain. 05/18/18  Yes Kerrin Champagne, MD  ALPRAZolam Prudy Feeler) 1 MG tablet Take 1 mg by mouth 4 (four) times daily.    Yes [provider]  amphetamine-dextroamphetamine (ADDERALL XR) 25 MG 24 hr capsule Take 1 capsule by mouth 2 (two) times daily.  07/17/15  Yes [provider]  cyclobenzaprine (FLEXERIL) 10 MG tablet Take 1 tablet (10 mg total) by mouth 2 (two) times daily as needed for muscle spasms. 12/13/18  Yes Cristie Hem, PA-C  diazepam (VALIUM) 5 MG tablet Take 1 by mouth 1 hour  pre-procedure with very light food. May bring 2nd tablet to appointment. 10/22/20  Yes Tyrell Antonio, MD  doxepin (SINEQUAN) 150 MG capsule TK 2 CS PO 1 HOUR B BED 03/30/18  Yes [provider]  FLUoxetine (PROZAC) 40 MG capsule Take 40 mg by mouth 2 (two) times daily.    Yes [provider]  HYDROcodone-acetaminophen (NORCO) 5-325 MG tablet Take 1-2 tablets by mouth daily as needed. 11/04/20  Yes Cristie Hem, PA-C  ibuprofen (ADVIL,MOTRIN) 800 MG tablet Take 1 tablet (800 mg total) by mouth 3 (three) times daily. 01/16/16  Yes Danelle Berry, PA-C  lamoTRIgine (LAMICTAL) 200 MG tablet Take 200 mg by mouth 2 (two) times daily.  02/17/14  Yes [provider]  LYRICA 200 MG capsule Take 1 capsule by mouth 2 (two) times daily. 07/23/15  Yes [provider]  methocarbamol (ROBAXIN) 500 MG tablet Take 1 tablet (500 mg total) by mouth 2 (two) times daily as needed. 12/19/20  Yes Cristie Hem, PA-C  oxyCODONE-acetaminophen  (PERCOCET) 5-325 MG tablet Take 1 tablet by mouth daily as needed for severe pain. 09/03/20  Yes Tarry Kos, MD  QUEtiapine (SEROQUEL) 200 MG tablet  04/29/18  Yes [provider]  rizatriptan (MAXALT) 10 MG tablet Take 20 mg by mouth daily as needed for migraine. May repeat in 2 hours if needed   Yes [provider]  traMADol (ULTRAM) 50 MG tablet Take 1-2 tablets (50-100 mg total) by mouth 2 (two) times daily as needed. 09/15/20  Yes Cristie Hem, PA-C  traMADol (ULTRAM) 50 MG tablet Take 1-2  tablets (50-100 mg total) by mouth daily as needed. 09/30/20  Yes Tarry Kos, MD  VRAYLAR capsule TK ONE C PO QAM 04/24/18  Yes [provider]  zolpidem (AMBIEN) 10 MG tablet Take 10 mg by mouth at bedtime. May take 1-1 tablets at hs prn 02/25/14  Yes [provider]    Family History Family History  Problem Relation Age of Onset   Cancer Father        lung   COPD Father    Early death Father    Hypertension Sister    Cancer Maternal Aunt        LUNG CANCER   Other Neg Hx     Social History Social History   Tobacco Use   Smoking status: Former    Packs/day: 0.50    Years: 32.00    Total pack years: 16.00    Types: Cigarettes    Quit date: 02/20/2017    Years since quitting: 5.2   Smokeless tobacco: Never   Tobacco comments:    currently 1/2 ppd/ STATES SHE QUIT OCT 2017  Vaping Use   Vaping Use: Never used  Substance Use Topics   Alcohol use: No    Alcohol/week: 0.0 standard drinks of alcohol   Drug use: No     Allergies   Cortisone acetate and Vicodin [hydrocodone-acetaminophen]   Review of Systems Review of Systems  Constitutional: Negative.   Respiratory: Negative.    Neurological:  Positive for dizziness. Negative for tremors, seizures, syncope, facial asymmetry, speech difficulty, weakness, light-headedness, numbness and headaches.     Physical Exam Triage Vital Signs ED Triage Vitals  Enc Vitals Group     BP 05/24/22  1159 136/85     Pulse Rate 05/24/22 1159 (!) 54     Resp 05/24/22 1159 18     Temp 05/24/22 1159 98.2 F (36.8 C)     Temp Source 05/24/22 1159 Oral     SpO2 05/24/22 1159 98 %     Weight --      Height 05/24/22 1201 5\' 4"  (1.626 m)     Head Circumference --      Peak Flow --      Pain Score 05/24/22 1201 8     Pain Loc --      Pain Edu? --      Excl. in GC? --    No data found.  Updated Vital Signs BP 136/85 (BP Location: Right Arm)   Pulse (!) 54   Temp 98.2 F (36.8 C) (Oral)   Resp 18   Ht 5\' 4"  (1.626 m)   SpO2 98%   BMI 26.61 kg/m   Visual Acuity Right Eye Distance:   Left Eye Distance:   Bilateral Distance:    Right Eye Near:   Left Eye Near:    Bilateral Near:     Physical Exam Constitutional:      Appearance: Normal appearance.  HENT:     Head: Normocephalic.      Comments: 1x1 nodule present to the right forehead, skin intact Eyes:     Extraocular Movements: Extraocular movements intact.     Conjunctiva/sclera: Conjunctivae normal.     Pupils: Pupils are equal, round, and reactive to light.     Comments: Ecchymosis and light periorbital swelling to the right upper eyelid, nontender, no abnormalities to the eye or sclera or conjunctiva, vision is grossly intact, extraocular movements intact  Pulmonary:     Effort: Pulmonary effort is normal.  Skin:    General: Skin is warm and dry.  Neurological:     General: No focal deficit present.     Mental Status: She is alert and oriented to person, place, and time. Mental status is at baseline.     Cranial Nerves: No cranial nerve deficit.     Sensory: No sensory deficit.     Motor: No weakness.     Gait: Gait normal.  Psychiatric:        Mood and Affect: Mood normal.        Behavior: Behavior normal.      UC Treatments / Results  Labs (all labs ordered are listed, but only abnormal results are displayed) Labs Reviewed - No data to display  EKG   Radiology No results  found.  Procedures Procedures (including critical care time)  Medications Ordered in UC Medications - No data to display  Initial Impression / Assessment and Plan / UC Course  I have reviewed the triage vital signs and the nursing notes.  Pertinent labs & imaging results that were available during my care of the patient were reviewed by me and considered in my medical decision making (see chart for details).  Dizziness, fall, initial encounter, nonpenetrating right injury, initial encounter  Orbit x-ray negative, no abnormalities to the neurological exam and symptoms of dizziness are consistent with vertigo, discussed with patient, prescribed meclizine for outpatient management advised changing positions slowly waiting at least 10 seconds between with, advised adequate rest and fluid intake for additional support, given strict precautions that if her dizziness caused her to collapse to department for further evaluation and work-up, has upcoming PCP appointment in 2 days, may have symptoms reevaluated at that time Final Clinical Impressions(s) / UC Diagnoses   Final diagnoses:  None   Discharge Instructions   None    ED Prescriptions   None    PDMP not reviewed this encounter.   Valinda Hoar, NP 05/24/22 1355

## 2022-09-30 ENCOUNTER — Other Ambulatory Visit: Payer: Self-pay

## 2023-01-03 DIAGNOSIS — Z79899 Other long term (current) drug therapy: Secondary | ICD-10-CM | POA: Diagnosis not present

## 2023-01-27 DIAGNOSIS — Z79899 Other long term (current) drug therapy: Secondary | ICD-10-CM | POA: Diagnosis not present

## 2023-02-16 DIAGNOSIS — E559 Vitamin D deficiency, unspecified: Secondary | ICD-10-CM | POA: Diagnosis not present

## 2023-02-16 DIAGNOSIS — K219 Gastro-esophageal reflux disease without esophagitis: Secondary | ICD-10-CM | POA: Diagnosis not present

## 2023-02-16 DIAGNOSIS — R946 Abnormal results of thyroid function studies: Secondary | ICD-10-CM | POA: Diagnosis not present

## 2023-02-16 DIAGNOSIS — R7309 Other abnormal glucose: Secondary | ICD-10-CM | POA: Diagnosis not present

## 2023-02-16 DIAGNOSIS — E785 Hyperlipidemia, unspecified: Secondary | ICD-10-CM | POA: Diagnosis not present

## 2023-02-17 DIAGNOSIS — M545 Low back pain, unspecified: Secondary | ICD-10-CM | POA: Diagnosis not present

## 2023-02-17 DIAGNOSIS — M797 Fibromyalgia: Secondary | ICD-10-CM | POA: Diagnosis not present

## 2023-02-23 DIAGNOSIS — S61219A Laceration without foreign body of unspecified finger without damage to nail, initial encounter: Secondary | ICD-10-CM | POA: Diagnosis not present

## 2023-02-23 DIAGNOSIS — C4492 Squamous cell carcinoma of skin, unspecified: Secondary | ICD-10-CM | POA: Diagnosis not present

## 2023-02-23 DIAGNOSIS — K219 Gastro-esophageal reflux disease without esophagitis: Secondary | ICD-10-CM | POA: Diagnosis not present

## 2023-02-23 DIAGNOSIS — J449 Chronic obstructive pulmonary disease, unspecified: Secondary | ICD-10-CM | POA: Diagnosis not present

## 2023-02-23 DIAGNOSIS — M797 Fibromyalgia: Secondary | ICD-10-CM | POA: Diagnosis not present

## 2023-02-23 DIAGNOSIS — Z0000 Encounter for general adult medical examination without abnormal findings: Secondary | ICD-10-CM | POA: Diagnosis not present

## 2023-02-23 DIAGNOSIS — E785 Hyperlipidemia, unspecified: Secondary | ICD-10-CM | POA: Diagnosis not present

## 2023-02-23 DIAGNOSIS — M5030 Other cervical disc degeneration, unspecified cervical region: Secondary | ICD-10-CM | POA: Diagnosis not present

## 2023-02-23 DIAGNOSIS — M5136 Other intervertebral disc degeneration, lumbar region: Secondary | ICD-10-CM | POA: Diagnosis not present

## 2023-02-23 DIAGNOSIS — G43009 Migraine without aura, not intractable, without status migrainosus: Secondary | ICD-10-CM | POA: Diagnosis not present

## 2023-03-03 DIAGNOSIS — M545 Low back pain, unspecified: Secondary | ICD-10-CM | POA: Diagnosis not present

## 2023-03-03 DIAGNOSIS — M797 Fibromyalgia: Secondary | ICD-10-CM | POA: Diagnosis not present

## 2023-03-10 DIAGNOSIS — R3129 Other microscopic hematuria: Secondary | ICD-10-CM | POA: Diagnosis not present

## 2023-03-10 DIAGNOSIS — R319 Hematuria, unspecified: Secondary | ICD-10-CM | POA: Diagnosis not present

## 2023-06-07 DIAGNOSIS — Z5181 Encounter for therapeutic drug level monitoring: Secondary | ICD-10-CM | POA: Diagnosis not present

## 2023-08-01 DIAGNOSIS — Z5181 Encounter for therapeutic drug level monitoring: Secondary | ICD-10-CM | POA: Diagnosis not present

## 2023-09-02 DIAGNOSIS — Z5181 Encounter for therapeutic drug level monitoring: Secondary | ICD-10-CM | POA: Diagnosis not present

## 2023-09-15 DIAGNOSIS — H0102A Squamous blepharitis right eye, upper and lower eyelids: Secondary | ICD-10-CM | POA: Diagnosis not present

## 2023-09-15 DIAGNOSIS — H0102B Squamous blepharitis left eye, upper and lower eyelids: Secondary | ICD-10-CM | POA: Diagnosis not present

## 2023-09-15 DIAGNOSIS — H2513 Age-related nuclear cataract, bilateral: Secondary | ICD-10-CM | POA: Diagnosis not present

## 2024-02-20 DIAGNOSIS — E785 Hyperlipidemia, unspecified: Secondary | ICD-10-CM | POA: Diagnosis not present

## 2024-02-20 DIAGNOSIS — E559 Vitamin D deficiency, unspecified: Secondary | ICD-10-CM | POA: Diagnosis not present

## 2024-02-20 DIAGNOSIS — R7309 Other abnormal glucose: Secondary | ICD-10-CM | POA: Diagnosis not present

## 2024-02-20 DIAGNOSIS — R946 Abnormal results of thyroid function studies: Secondary | ICD-10-CM | POA: Diagnosis not present

## 2024-02-22 DIAGNOSIS — Z5181 Encounter for therapeutic drug level monitoring: Secondary | ICD-10-CM | POA: Diagnosis not present

## 2024-02-27 DIAGNOSIS — E559 Vitamin D deficiency, unspecified: Secondary | ICD-10-CM | POA: Diagnosis not present

## 2024-02-27 DIAGNOSIS — M51362 Other intervertebral disc degeneration, lumbar region with discogenic back pain and lower extremity pain: Secondary | ICD-10-CM | POA: Diagnosis not present

## 2024-02-27 DIAGNOSIS — R634 Abnormal weight loss: Secondary | ICD-10-CM | POA: Diagnosis not present

## 2024-02-27 DIAGNOSIS — R197 Diarrhea, unspecified: Secondary | ICD-10-CM | POA: Diagnosis not present

## 2024-02-27 DIAGNOSIS — Z0000 Encounter for general adult medical examination without abnormal findings: Secondary | ICD-10-CM | POA: Diagnosis not present

## 2024-02-27 DIAGNOSIS — G43009 Migraine without aura, not intractable, without status migrainosus: Secondary | ICD-10-CM | POA: Diagnosis not present

## 2024-02-27 DIAGNOSIS — Z79899 Other long term (current) drug therapy: Secondary | ICD-10-CM | POA: Diagnosis not present

## 2024-02-27 DIAGNOSIS — E785 Hyperlipidemia, unspecified: Secondary | ICD-10-CM | POA: Diagnosis not present

## 2024-02-27 DIAGNOSIS — J449 Chronic obstructive pulmonary disease, unspecified: Secondary | ICD-10-CM | POA: Diagnosis not present

## 2024-02-27 DIAGNOSIS — R109 Unspecified abdominal pain: Secondary | ICD-10-CM | POA: Diagnosis not present

## 2024-03-01 ENCOUNTER — Other Ambulatory Visit: Payer: Self-pay | Admitting: Family Medicine

## 2024-03-01 ENCOUNTER — Encounter: Payer: Self-pay | Admitting: Family Medicine

## 2024-03-01 DIAGNOSIS — R634 Abnormal weight loss: Secondary | ICD-10-CM

## 2024-03-01 DIAGNOSIS — J449 Chronic obstructive pulmonary disease, unspecified: Secondary | ICD-10-CM

## 2024-03-01 DIAGNOSIS — F17200 Nicotine dependence, unspecified, uncomplicated: Secondary | ICD-10-CM

## 2024-03-01 DIAGNOSIS — R197 Diarrhea, unspecified: Secondary | ICD-10-CM

## 2024-03-01 DIAGNOSIS — R109 Unspecified abdominal pain: Secondary | ICD-10-CM

## 2024-03-07 ENCOUNTER — Other Ambulatory Visit: Payer: Self-pay | Admitting: Family Medicine

## 2024-03-07 DIAGNOSIS — R109 Unspecified abdominal pain: Secondary | ICD-10-CM

## 2024-03-07 DIAGNOSIS — R197 Diarrhea, unspecified: Secondary | ICD-10-CM

## 2024-03-07 DIAGNOSIS — R634 Abnormal weight loss: Secondary | ICD-10-CM

## 2024-03-07 DIAGNOSIS — J449 Chronic obstructive pulmonary disease, unspecified: Secondary | ICD-10-CM

## 2024-03-09 ENCOUNTER — Ambulatory Visit
Admission: RE | Admit: 2024-03-09 | Discharge: 2024-03-09 | Disposition: A | Discharge status: Home / Self Care | Source: Ambulatory Visit | Attending: Family Medicine | Admitting: Family Medicine

## 2024-03-09 DIAGNOSIS — J449 Chronic obstructive pulmonary disease, unspecified: Secondary | ICD-10-CM

## 2024-03-09 DIAGNOSIS — R109 Unspecified abdominal pain: Secondary | ICD-10-CM

## 2024-03-09 DIAGNOSIS — F17200 Nicotine dependence, unspecified, uncomplicated: Secondary | ICD-10-CM | POA: Diagnosis not present

## 2024-03-09 DIAGNOSIS — I700 Atherosclerosis of aorta: Secondary | ICD-10-CM | POA: Diagnosis not present

## 2024-03-09 DIAGNOSIS — F17210 Nicotine dependence, cigarettes, uncomplicated: Secondary | ICD-10-CM | POA: Diagnosis not present

## 2024-03-09 DIAGNOSIS — R197 Diarrhea, unspecified: Secondary | ICD-10-CM

## 2024-03-09 DIAGNOSIS — R634 Abnormal weight loss: Secondary | ICD-10-CM

## 2024-03-09 DIAGNOSIS — Z122 Encounter for screening for malignant neoplasm of respiratory organs: Secondary | ICD-10-CM | POA: Diagnosis not present

## 2024-03-09 DIAGNOSIS — J439 Emphysema, unspecified: Secondary | ICD-10-CM | POA: Diagnosis not present

## 2024-03-22 ENCOUNTER — Ambulatory Visit
Admission: RE | Admit: 2024-03-22 | Discharge: 2024-03-22 | Disposition: A | Discharge status: Home / Self Care | Source: Ambulatory Visit | Attending: Family Medicine | Admitting: Family Medicine

## 2024-03-22 DIAGNOSIS — R197 Diarrhea, unspecified: Secondary | ICD-10-CM

## 2024-03-22 DIAGNOSIS — J449 Chronic obstructive pulmonary disease, unspecified: Secondary | ICD-10-CM

## 2024-03-22 DIAGNOSIS — R109 Unspecified abdominal pain: Secondary | ICD-10-CM

## 2024-03-22 DIAGNOSIS — R634 Abnormal weight loss: Secondary | ICD-10-CM

## 2024-03-22 MED ORDER — IOPAMIDOL (ISOVUE-370) INJECTION 76%
80.0000 mL | Freq: Once | INTRAVENOUS | Status: AC | PRN
  Administered 2024-03-22: 80 mL via INTRAVENOUS

## 2024-04-26 DIAGNOSIS — R634 Abnormal weight loss: Secondary | ICD-10-CM | POA: Diagnosis not present

## 2024-04-26 DIAGNOSIS — R109 Unspecified abdominal pain: Secondary | ICD-10-CM | POA: Diagnosis not present

## 2024-04-26 DIAGNOSIS — R194 Change in bowel habit: Secondary | ICD-10-CM | POA: Diagnosis not present

## 2024-04-27 ENCOUNTER — Other Ambulatory Visit: Payer: Self-pay | Admitting: Family Medicine

## 2024-04-27 DIAGNOSIS — R109 Unspecified abdominal pain: Secondary | ICD-10-CM

## 2024-04-30 ENCOUNTER — Encounter: Payer: Self-pay | Admitting: Family Medicine

## 2024-05-01 DIAGNOSIS — Z1231 Encounter for screening mammogram for malignant neoplasm of breast: Secondary | ICD-10-CM | POA: Diagnosis not present

## 2024-05-05 ENCOUNTER — Ambulatory Visit
Admission: RE | Admit: 2024-05-05 | Discharge: 2024-05-05 | Disposition: A | Discharge status: Home / Self Care | Source: Ambulatory Visit | Attending: Family Medicine | Admitting: Family Medicine

## 2024-05-05 DIAGNOSIS — K838 Other specified diseases of biliary tract: Secondary | ICD-10-CM | POA: Diagnosis not present

## 2024-05-05 DIAGNOSIS — K760 Fatty (change of) liver, not elsewhere classified: Secondary | ICD-10-CM | POA: Diagnosis not present

## 2024-05-05 DIAGNOSIS — R109 Unspecified abdominal pain: Secondary | ICD-10-CM | POA: Diagnosis not present

## 2024-05-05 MED ORDER — GADOPICLENOL 0.5 MMOL/ML IV SOLN
6.0000 mL | Freq: Once | INTRAVENOUS | Status: AC | PRN
  Administered 2024-05-05: 6 mL via INTRAVENOUS

## 2024-05-22 DIAGNOSIS — K921 Melena: Secondary | ICD-10-CM | POA: Diagnosis not present

## 2024-05-22 DIAGNOSIS — R110 Nausea: Secondary | ICD-10-CM | POA: Diagnosis not present

## 2024-05-22 DIAGNOSIS — K8050 Calculus of bile duct without cholangitis or cholecystitis without obstruction: Secondary | ICD-10-CM | POA: Diagnosis not present

## 2024-05-22 DIAGNOSIS — R634 Abnormal weight loss: Secondary | ICD-10-CM | POA: Diagnosis not present

## 2024-05-22 DIAGNOSIS — R109 Unspecified abdominal pain: Secondary | ICD-10-CM | POA: Diagnosis not present

## 2024-05-22 DIAGNOSIS — R1310 Dysphagia, unspecified: Secondary | ICD-10-CM | POA: Diagnosis not present

## 2024-05-22 DIAGNOSIS — K5900 Constipation, unspecified: Secondary | ICD-10-CM | POA: Diagnosis not present

## 2024-05-22 DIAGNOSIS — L299 Pruritus, unspecified: Secondary | ICD-10-CM | POA: Diagnosis not present

## 2024-05-22 DIAGNOSIS — Z800 Family history of malignant neoplasm of digestive organs: Secondary | ICD-10-CM | POA: Diagnosis not present

## 2024-05-24 DIAGNOSIS — K921 Melena: Secondary | ICD-10-CM | POA: Diagnosis not present

## 2024-05-30 DIAGNOSIS — B9681 Helicobacter pylori [H. pylori] as the cause of diseases classified elsewhere: Secondary | ICD-10-CM | POA: Diagnosis not present

## 2024-05-30 DIAGNOSIS — D124 Benign neoplasm of descending colon: Secondary | ICD-10-CM | POA: Diagnosis not present

## 2024-05-30 DIAGNOSIS — K2970 Gastritis, unspecified, without bleeding: Secondary | ICD-10-CM | POA: Diagnosis not present

## 2024-05-30 DIAGNOSIS — K2289 Other specified disease of esophagus: Secondary | ICD-10-CM | POA: Diagnosis not present

## 2024-05-30 DIAGNOSIS — D125 Benign neoplasm of sigmoid colon: Secondary | ICD-10-CM | POA: Diagnosis not present

## 2024-05-30 DIAGNOSIS — K921 Melena: Secondary | ICD-10-CM | POA: Diagnosis not present

## 2024-05-30 DIAGNOSIS — R1031 Right lower quadrant pain: Secondary | ICD-10-CM | POA: Diagnosis not present

## 2024-05-30 DIAGNOSIS — R1310 Dysphagia, unspecified: Secondary | ICD-10-CM | POA: Diagnosis not present

## 2024-05-30 DIAGNOSIS — R1032 Left lower quadrant pain: Secondary | ICD-10-CM | POA: Diagnosis not present

## 2024-05-30 DIAGNOSIS — K2930 Chronic superficial gastritis without bleeding: Secondary | ICD-10-CM | POA: Diagnosis not present

## 2024-05-30 DIAGNOSIS — D123 Benign neoplasm of transverse colon: Secondary | ICD-10-CM | POA: Diagnosis not present

## 2024-06-13 DIAGNOSIS — K921 Melena: Secondary | ICD-10-CM | POA: Diagnosis not present

## 2024-06-15 ENCOUNTER — Other Ambulatory Visit (HOSPITAL_COMMUNITY): Payer: Self-pay | Admitting: Student

## 2024-06-15 DIAGNOSIS — R140 Abdominal distension (gaseous): Secondary | ICD-10-CM

## 2024-06-15 DIAGNOSIS — R110 Nausea: Secondary | ICD-10-CM

## 2024-06-27 ENCOUNTER — Encounter (HOSPITAL_COMMUNITY)
Admission: RE | Admit: 2024-06-27 | Discharge: 2024-06-27 | Disposition: A | Discharge status: Home / Self Care | Source: Ambulatory Visit | Attending: Student | Admitting: Student

## 2024-06-27 ENCOUNTER — Encounter (HOSPITAL_COMMUNITY): Payer: Self-pay

## 2024-06-27 DIAGNOSIS — R140 Abdominal distension (gaseous): Secondary | ICD-10-CM | POA: Diagnosis not present

## 2024-06-27 DIAGNOSIS — R110 Nausea: Secondary | ICD-10-CM | POA: Diagnosis not present

## 2024-06-27 MED ORDER — TECHNETIUM TC 99M SULFUR COLLOID
2.0000 | Freq: Once | INTRAVENOUS | Status: AC | PRN
  Administered 2024-06-27: 2.1 via ORAL

## 2024-10-17 ENCOUNTER — Other Ambulatory Visit: Payer: Self-pay | Admitting: Gastroenterology

## 2024-10-26 ENCOUNTER — Emergency Department (HOSPITAL_COMMUNITY)

## 2024-10-26 ENCOUNTER — Other Ambulatory Visit: Payer: Self-pay

## 2024-10-26 ENCOUNTER — Inpatient Hospital Stay (HOSPITAL_COMMUNITY)
Admission: EM | Admit: 2024-10-26 | Discharge: 2024-10-28 | DRG: 103 | Disposition: A | Discharge status: Home / Self Care | Attending: Internal Medicine | Admitting: Internal Medicine

## 2024-10-26 ENCOUNTER — Encounter (HOSPITAL_COMMUNITY): Payer: Self-pay

## 2024-10-26 DIAGNOSIS — Z9049 Acquired absence of other specified parts of digestive tract: Secondary | ICD-10-CM | POA: Diagnosis not present

## 2024-10-26 DIAGNOSIS — E785 Hyperlipidemia, unspecified: Secondary | ICD-10-CM | POA: Diagnosis present

## 2024-10-26 DIAGNOSIS — G43109 Migraine with aura, not intractable, without status migrainosus: Secondary | ICD-10-CM | POA: Diagnosis present

## 2024-10-26 DIAGNOSIS — Z825 Family history of asthma and other chronic lower respiratory diseases: Secondary | ICD-10-CM

## 2024-10-26 DIAGNOSIS — I10 Essential (primary) hypertension: Secondary | ICD-10-CM | POA: Diagnosis present

## 2024-10-26 DIAGNOSIS — Z90710 Acquired absence of both cervix and uterus: Secondary | ICD-10-CM | POA: Diagnosis not present

## 2024-10-26 DIAGNOSIS — R200 Anesthesia of skin: Secondary | ICD-10-CM | POA: Diagnosis not present

## 2024-10-26 DIAGNOSIS — F319 Bipolar disorder, unspecified: Secondary | ICD-10-CM | POA: Diagnosis present

## 2024-10-26 DIAGNOSIS — Z90721 Acquired absence of ovaries, unilateral: Secondary | ICD-10-CM | POA: Diagnosis not present

## 2024-10-26 DIAGNOSIS — I639 Cerebral infarction, unspecified: Secondary | ICD-10-CM | POA: Diagnosis not present

## 2024-10-26 DIAGNOSIS — Z79899 Other long term (current) drug therapy: Secondary | ICD-10-CM

## 2024-10-26 DIAGNOSIS — R4781 Slurred speech: Secondary | ICD-10-CM | POA: Diagnosis present

## 2024-10-26 DIAGNOSIS — G43909 Migraine, unspecified, not intractable, without status migrainosus: Secondary | ICD-10-CM | POA: Diagnosis not present

## 2024-10-26 DIAGNOSIS — F419 Anxiety disorder, unspecified: Secondary | ICD-10-CM | POA: Diagnosis present

## 2024-10-26 DIAGNOSIS — R531 Weakness: Secondary | ICD-10-CM | POA: Diagnosis not present

## 2024-10-26 DIAGNOSIS — M797 Fibromyalgia: Secondary | ICD-10-CM | POA: Diagnosis present

## 2024-10-26 DIAGNOSIS — F1490 Cocaine use, unspecified, uncomplicated: Secondary | ICD-10-CM

## 2024-10-26 DIAGNOSIS — G43809 Other migraine, not intractable, without status migrainosus: Secondary | ICD-10-CM | POA: Diagnosis not present

## 2024-10-26 DIAGNOSIS — Z801 Family history of malignant neoplasm of trachea, bronchus and lung: Secondary | ICD-10-CM

## 2024-10-26 DIAGNOSIS — Z8249 Family history of ischemic heart disease and other diseases of the circulatory system: Secondary | ICD-10-CM | POA: Diagnosis not present

## 2024-10-26 DIAGNOSIS — R29810 Facial weakness: Secondary | ICD-10-CM | POA: Diagnosis present

## 2024-10-26 DIAGNOSIS — F17210 Nicotine dependence, cigarettes, uncomplicated: Secondary | ICD-10-CM

## 2024-10-26 DIAGNOSIS — J4489 Other specified chronic obstructive pulmonary disease: Secondary | ICD-10-CM | POA: Diagnosis present

## 2024-10-26 DIAGNOSIS — Z885 Allergy status to narcotic agent status: Secondary | ICD-10-CM | POA: Diagnosis not present

## 2024-10-26 DIAGNOSIS — F1410 Cocaine abuse, uncomplicated: Secondary | ICD-10-CM | POA: Diagnosis present

## 2024-10-26 DIAGNOSIS — G8929 Other chronic pain: Secondary | ICD-10-CM | POA: Diagnosis present

## 2024-10-26 DIAGNOSIS — Z87891 Personal history of nicotine dependence: Secondary | ICD-10-CM | POA: Diagnosis not present

## 2024-10-26 DIAGNOSIS — Z888 Allergy status to other drugs, medicaments and biological substances status: Secondary | ICD-10-CM | POA: Diagnosis not present

## 2024-10-26 DIAGNOSIS — J439 Emphysema, unspecified: Secondary | ICD-10-CM | POA: Diagnosis present

## 2024-10-26 LAB — DIFFERENTIAL
Abs Immature Granulocytes: 0.02 K/uL (ref 0.00–0.07)
Basophils Absolute: 0.1 K/uL (ref 0.0–0.1)
Basophils Relative: 1 %
Eosinophils Absolute: 0.1 K/uL (ref 0.0–0.5)
Eosinophils Relative: 2 %
Immature Granulocytes: 0 %
Lymphocytes Relative: 33 %
Lymphs Abs: 2 K/uL (ref 0.7–4.0)
Monocytes Absolute: 0.4 K/uL (ref 0.1–1.0)
Monocytes Relative: 6 %
Neutro Abs: 3.5 K/uL (ref 1.7–7.7)
Neutrophils Relative %: 58 %

## 2024-10-26 LAB — PROTIME-INR
INR: 0.9 (ref 0.8–1.2)
Prothrombin Time: 13 s (ref 11.4–15.2)

## 2024-10-26 LAB — CBC
HCT: 39.1 % (ref 36.0–46.0)
Hemoglobin: 13.4 g/dL (ref 12.0–15.0)
MCH: 30.9 pg (ref 26.0–34.0)
MCHC: 34.3 g/dL (ref 30.0–36.0)
MCV: 90.1 fL (ref 80.0–100.0)
Platelets: 318 K/uL (ref 150–400)
RBC: 4.34 MIL/uL (ref 3.87–5.11)
RDW: 13.1 % (ref 11.5–15.5)
WBC: 6.1 K/uL (ref 4.0–10.5)
nRBC: 0 % (ref 0.0–0.2)

## 2024-10-26 LAB — COMPREHENSIVE METABOLIC PANEL WITH GFR
ALT: 26 U/L (ref 0–44)
AST: 22 U/L (ref 15–41)
Albumin: 4.4 g/dL (ref 3.5–5.0)
Alkaline Phosphatase: 28 U/L — ABNORMAL LOW (ref 38–126)
Anion gap: 10 (ref 5–15)
BUN: 12 mg/dL (ref 6–20)
CO2: 24 mmol/L (ref 22–32)
Calcium: 9.4 mg/dL (ref 8.9–10.3)
Chloride: 107 mmol/L (ref 98–111)
Creatinine, Ser: 0.65 mg/dL (ref 0.44–1.00)
GFR, Estimated: >60 mL/min
Glucose, Bld: 96 mg/dL (ref 70–99)
Potassium: 4.2 mmol/L (ref 3.5–5.1)
Sodium: 141 mmol/L (ref 135–145)
Total Bilirubin: <0.2 mg/dL (ref 0.0–1.2)
Total Protein: 6.9 g/dL (ref 6.5–8.1)

## 2024-10-26 LAB — URINE DRUG SCREEN
Amphetamines: NEGATIVE
Barbiturates: NEGATIVE
Benzodiazepines: NEGATIVE
Cocaine: POSITIVE — AB
Fentanyl: NEGATIVE
Methadone Scn, Ur: NEGATIVE
Opiates: NEGATIVE
Tetrahydrocannabinol: NEGATIVE

## 2024-10-26 LAB — I-STAT CHEM 8, ED
BUN: 12 mg/dL (ref 6–20)
Calcium, Ion: 1.23 mmol/L (ref 1.15–1.40)
Chloride: 107 mmol/L (ref 98–111)
Creatinine, Ser: 0.7 mg/dL (ref 0.44–1.00)
Glucose, Bld: 95 mg/dL (ref 70–99)
HCT: 39 % (ref 36.0–46.0)
Hemoglobin: 13.3 g/dL (ref 12.0–15.0)
Potassium: 4.1 mmol/L (ref 3.5–5.1)
Sodium: 143 mmol/L (ref 135–145)
TCO2: 24 mmol/L (ref 22–32)

## 2024-10-26 LAB — ETHANOL: Alcohol, Ethyl (B): <15 mg/dL

## 2024-10-26 LAB — LIPASE, BLOOD: Lipase: 41 U/L (ref 11–51)

## 2024-10-26 LAB — APTT: aPTT: 31 s (ref 24–36)

## 2024-10-26 MED ORDER — FENTANYL CITRATE (PF) 50 MCG/ML IJ SOSY
50.0000 ug | PREFILLED_SYRINGE | Freq: Once | INTRAMUSCULAR | Status: AC
  Administered 2024-10-26: 50 ug via INTRAVENOUS
  Filled 2024-10-26: qty 1

## 2024-10-26 MED ORDER — ASPIRIN 325 MG PO TABS
325.0000 mg | ORAL_TABLET | Freq: Once | ORAL | Status: AC
  Administered 2024-10-26: 325 mg via ORAL
  Filled 2024-10-26: qty 1

## 2024-10-26 MED ORDER — CLOPIDOGREL BISULFATE 300 MG PO TABS
300.0000 mg | ORAL_TABLET | Freq: Once | ORAL | Status: AC
  Administered 2024-10-26: 300 mg via ORAL
  Filled 2024-10-26: qty 1

## 2024-10-26 MED ORDER — SODIUM CHLORIDE 0.9% FLUSH
3.0000 mL | Freq: Once | INTRAVENOUS | Status: AC
  Administered 2024-10-26: 3 mL via INTRAVENOUS

## 2024-10-26 NOTE — ED Provider Notes (Signed)
 " [place] [facility] Provider Note   CSN: [id number] Arrival date & time: 10/26/24  1935     Patient presents with: Numbness   [name] is a 58 y.o. female.   Patient is a 58 year old female with a history of fibromyalgia, COPD, bipolar disorder, chronic abdominal pain who presents with right side numbness.  She said about 2 hours ago she suddenly started having numbness on her right side.  She said she had a little bit on her left side but it was mostly on her right side and her arm and her leg.  She also feels weaker on her right arm and right leg.  She feels like the right side of her face is a little bit numb more.  She feels like her speech was a little bit slurred and her family said the corner of her mouth on the right side was drooping.  She does have a headache.  She also has chronic abdominal pain and is having some spasms of her abdomen.  She is followed by GI for this.  She says she has had extensive testing has been nonrevealing.  She also has a history of cocaine use and last used cocaine yesterday.       Prior to Admission medications  Medication Sig Start Date End Date Taking? Authorizing Provider  acetaminophen -codeine  (TYLENOL  #4) 300-60 MG tablet Take 1 tablet by mouth every 4 (four) hours as needed for moderate pain. 05/18/18   Nitka, James E, MD  ALPRAZolam  (XANAX ) 1 MG tablet Take 1 mg by mouth 4 (four) times daily.     [provider]  amphetamine -dextroamphetamine  (ADDERALL XR) 25 MG 24 hr capsule Take 1 capsule by mouth 2 (two) times daily.  07/17/15   [provider]  cyclobenzaprine  (FLEXERIL ) 10 MG tablet Take 1 tablet (10 mg total) by mouth 2 (two) times daily as needed for muscle spasms. 12/13/18   Jule Ronal CROME, PA-C  diazepam  (VALIUM ) 5 MG tablet Take 1 by mouth 1 hour  pre-procedure with very light food. May bring 2nd tablet to appointment. 10/22/20   Eldonna Novel, MD  doxepin (SINEQUAN)  150 MG capsule TK 2 CS PO 1 HOUR B BED 03/30/18   [provider]  FLUoxetine  (PROZAC ) 40 MG capsule Take 40 mg by mouth 2 (two) times daily.     [provider]  HYDROcodone -acetaminophen  (NORCO) 5-325 MG tablet Take 1-2 tablets by mouth daily as needed. 11/04/20   Jule Ronal CROME, PA-C  ibuprofen  (ADVIL ,MOTRIN ) 800 MG tablet Take 1 tablet (800 mg total) by mouth 3 (three) times daily. 01/16/16   Tapia, Leisa, PA-C  lamoTRIgine  (LAMICTAL ) 200 MG tablet Take 200 mg by mouth 2 (two) times daily.  02/17/14   [provider]  LYRICA  200 MG capsule Take 1 capsule by mouth 2 (two) times daily. 07/23/15   [provider]  meclizine  (ANTIVERT ) 12.5 MG tablet Take 1 tablet (12.5 mg total) by mouth 3 (three) times daily as needed for dizziness. 05/24/22   White, Shelba JONELLE, NP  methocarbamol  (ROBAXIN ) 500 MG tablet Take 1 tablet (500 mg total) by mouth 2 (two) times daily as needed. 12/19/20   Jule Ronal CROME, PA-C  oxyCODONE -acetaminophen  (PERCOCET) 5-325 MG tablet Take 1 tablet by mouth daily as needed for severe pain. 09/03/20   Jerri Kay HERO, MD  QUEtiapine  (SEROQUEL ) 200 MG tablet  04/29/18   [provider]  rizatriptan (MAXALT) 10 MG tablet Take 20  mg by mouth daily as needed for migraine. May repeat in 2 hours if needed    [provider]  traMADol  (ULTRAM ) 50 MG tablet Take 1-2 tablets (50-100 mg total) by mouth 2 (two) times daily as needed. 09/15/20   Jule Ronal CROME, PA-C  traMADol  (ULTRAM ) 50 MG tablet Take 1-2 tablets (50-100 mg total) by mouth daily as needed. 09/30/20   Jerri Kay HERO, MD  VRAYLAR capsule TK ONE C PO QAM 04/24/18   [provider]  zolpidem  (AMBIEN ) 10 MG tablet Take 10 mg by mouth at bedtime. May take 1-1 tablets at hs prn 02/25/14   [provider]    Allergies: Cortisone acetate and Vicodin [hydrocodone -acetaminophen ]    Review of Systems  Constitutional:  Negative for chills, diaphoresis, fatigue and fever.   HENT:  Negative for congestion, rhinorrhea and sneezing.   Eyes: Negative.   Respiratory:  Negative for cough, chest tightness and shortness of breath.   Cardiovascular:  Negative for chest pain and leg swelling.  Gastrointestinal:  Positive for abdominal pain and nausea. Negative for diarrhea and vomiting.  Genitourinary:  Negative for difficulty urinating, flank pain, frequency and hematuria.  Musculoskeletal:  Negative for arthralgias and back pain.  Skin:  Negative for rash.  Neurological:  Positive for speech difficulty, weakness, numbness and headaches. Negative for dizziness and light-headedness.    Updated Vital Signs BP (!) 146/85   Pulse (!) 59   Temp 98 F (36.7 C) (Axillary)   Resp 14   Ht 5' 3 (1.6 m)   Wt 71.7 kg   SpO2 99%   BMI 27.99 kg/m   Physical Exam Constitutional:      Appearance: She is well-developed.  HENT:     Head: Normocephalic and atraumatic.  Eyes:     Pupils: Pupils are equal, round, and reactive to light.  Cardiovascular:     Rate and Rhythm: Normal rate and regular rhythm.     Heart sounds: Normal heart sounds.  Pulmonary:     Effort: Pulmonary effort is normal. No respiratory distress.     Breath sounds: Normal breath sounds. No wheezing or rales.  Chest:     Chest wall: No tenderness.  Abdominal:     General: Bowel sounds are normal.     Palpations: Abdomen is soft.     Tenderness: There is no abdominal tenderness. There is no guarding or rebound.  Musculoskeletal:        General: Normal range of motion.     Cervical back: Normal range of motion and neck supple.  Lymphadenopathy:     Cervical: No cervical adenopathy.  Skin:    General: Skin is warm and dry.     Findings: No rash.  Neurological:     Mental Status: She is alert and oriented to person, place, and time.     Comments: Some numbness to the right arm and right leg as compared to the left.  She has numbness in the right side of the face as compared to the left.  No  obvious facial drooping.  She says she feels weaker on the right side as compared to the left but I do not appreciate any weakness.  No drift, finger-nose is intact     (all labs ordered are listed, but only abnormal results are displayed) Labs Reviewed  COMPREHENSIVE METABOLIC PANEL WITH GFR - Abnormal; Notable for the following components:      Result Value   Alkaline Phosphatase 28 (*)  All other components within normal limits  URINE DRUG SCREEN - Abnormal; Notable for the following components:   Cocaine POSITIVE (*)    All other components within normal limits  PROTIME-INR  APTT  CBC  DIFFERENTIAL  ETHANOL  LIPASE, BLOOD  I-STAT CHEM 8, ED  CBG MONITORING, ED    EKG: EKG Interpretation Date/Time:  Friday October 26 2024 20:02:50 EST Ventricular Rate:  73 PR Interval:  141 QRS Duration:  88 QT Interval:  376 QTC Calculation: 415 R Axis:   14  Text Interpretation: Sinus rhythm since last tracing no significant change Confirmed by Lenor Hollering [phone]) on 10/26/2024 8:12:50 PM  Radiology: CT HEAD CODE STROKE WO CONTRAST Result Date: 10/26/2024 EXAM: CT HEAD WITHOUT CONTRAST 10/26/2024 07:57:31 PM TECHNIQUE: CT of the head was performed without the administration of intravenous contrast. Automated exposure control, iterative reconstruction, and/or weight based adjustment of the mA/kV was utilized to reduce the radiation dose to as low as reasonably achievable. COMPARISON: CT head 06/19/2014. CLINICAL HISTORY: Neuro deficit, acute, stroke suspected. FINDINGS: BRAIN AND VENTRICLES: No acute hemorrhage. No evidence of acute infarct. No hydrocephalus. No extra-axial collection. No mass effect or midline shift. 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 [place] Stroke Program Early CT (ASPECT) score: Ganglionic (caudate, internal capsule, lentiform nucleus, insula, M1-M3): 7 Supraganglionic (M4-M6): 3 Total: 10  ORBITS: No acute abnormality. SINUSES: No acute abnormality. SOFT TISSUES AND SKULL: No acute soft tissue abnormality. No skull fracture. IMPRESSION: 1. No acute intracranial abnormality. 2. ASPECTS score is 10. 3. Possible cerebellar tonsil ectopia, greater on the right, although the foramen magnum is incompletely visualized. 4. Impression #1 and #2 messaged to Dr. Lindzen at 8:05 PM on 10/26/24. Electronically signed by: Donnice Mania MD 10/26/2024 08:06 PM EST RP Workstation: [id number]     Procedures   Medications Ordered in the ED  fentaNYL  (SUBLIMAZE ) injection 50 mcg (has no administration in time range)  sodium chloride  flush (NS) 0.9 % injection 3 mL (3 mLs Intravenous Given 10/26/24 2012)  aspirin  tablet 325 mg (325 mg Oral Given 10/26/24 2040)  clopidogrel  (PLAVIX ) tablet 300 mg (300 mg Oral Given 10/26/24 2040)  fentaNYL  (SUBLIMAZE ) injection 50 mcg (50 mcg Intravenous Given 10/26/24 2058)                                    Medical Decision Making Amount and/or Complexity of Data Reviewed Labs: ordered. Radiology: ordered.  Risk Prescription drug management. Decision regarding hospitalization.   This patient presents to the ED for concern of right side numbness and weakness, this involves an extensive number of treatment options, and is a complaint that carries with it a high risk of complications and morbidity.  I considered the following differential and admission for this acute, potentially life threatening condition.  The differential diagnosis includes stroke, TIA, brain mass, intracranial hemorrhage, anxiety, radicular symptoms  MDM:    Patient is a 58 year old who presents with numbness and weakness in the right arm and right leg as compared to left.  Code stroke was activated.  Head CT does not show any acute abnormality.  Patient was seen by neurology who recommends admission for further stroke workup.  Was not a candidate for TNK per neurology at this point.  UDS  is positive for cocaine.  Other labs are nonconcerning.  Will plan admission to the hospitalist service.  Discussed with Dr.  Dorrell who will admit the patient.  CRITICAL CARE Performed by: Andrea Ness Total critical care time: 60 minutes Critical care time was exclusive of separately billable procedures and treating other patients. Critical care was necessary to treat or prevent imminent or life-threatening deterioration. Critical care was time spent personally by me on the following activities: development of treatment plan with patient and/or surrogate as well as nursing, discussions with consultants, evaluation of patient's response to treatment, examination of patient, obtaining history from patient or surrogate, ordering and performing treatments and interventions, ordering and review of laboratory studies, ordering and review of radiographic studies, pulse oximetry and re-evaluation of patient's condition.  (Labs, imaging, consults)  Labs: I Ordered, and personally interpreted labs.  The pertinent results include: Normal white count, electrolytes nonconcerning, UDS positive  Imaging Studies ordered: I ordered imaging studies including CT head I independently visualized and interpreted imaging. I agree with the radiologist interpretation  Additional history obtained from  .  External records from outside source obtained and reviewed including prior notes  Cardiac Monitoring: The patient was maintained on a cardiac monitor.  If on the cardiac monitor, I personally viewed and interpreted the cardiac monitored which showed an underlying rhythm of: Sinus rhythm  Reevaluation: After the interventions noted above, I reevaluated the patient and found that they have :stayed the same  Social Determinants of Health:  substance abuse  Disposition: Admit to hospital  Co morbidities that complicate the patient evaluation  Past Medical History:  Diagnosis Date   Abscess    rectal    Anxiety    Arthritis    back; hips (11/14/2014)   Bipolar disorder (HCC)    Chronic back pain    all over   Chronic bronchitis (HCC)    I get it q yr (11/14/2014)   COPD (chronic obstructive pulmonary disease) (HCC)    Depression    Dyspnea    W/ EXERTION    Emphysema lung (HCC)    Endometriosis    Fibromyalgia    History of kidney stones    ILD (interstitial lung disease) (HCC) 11/2013   Migraine    3 in the last 2 wks (11/14/2014)   Ovarian cyst    Pulmonary infiltrates      Medicines Meds ordered this encounter  Medications   sodium chloride  flush (NS) 0.9 % injection 3 mL   aspirin  tablet 325 mg   clopidogrel  (PLAVIX ) tablet 300 mg   fentaNYL  (SUBLIMAZE ) injection 50 mcg   fentaNYL  (SUBLIMAZE ) injection 50 mcg    I have reviewed the patients home medicines and have made adjustments as needed  Problem List / ED Course: Problem List Items Addressed This Visit   None Visit Diagnoses       Right sided weakness    -  Primary     Chronic abdominal pain       Relevant Medications   aspirin  tablet 325 mg (Completed)   fentaNYL  (SUBLIMAZE ) injection 50 mcg (Completed)   fentaNYL  (SUBLIMAZE ) injection 50 mcg (Start on 10/26/2024 11:30 PM)                Final diagnoses:  Right sided weakness  Chronic abdominal pain    ED Discharge Orders     None          Ness Andrea, MD 10/26/24 2327  "

## 2024-10-26 NOTE — ED Notes (Signed)
Pt assisted to restroom with 1 person assist

## 2024-10-26 NOTE — ED Notes (Signed)
 LKW 1800. Pt arrived 1935. Code stroke called and CT cleared at 1951. Pt arrived in CT and blood was drawn at 1954. Neurologist arrived at 2012. Treatment decision made at 2030.

## 2024-10-26 NOTE — Consult Note (Signed)
 NEUROLOGY CONSULT NOTE   Date of service: October 26, 2024 Patient Name: [name] MRN:  [id number] DOB:  1966/10/19 Chief Complaint: R sided numbness and weakness Requesting Provider: Lenor Hollering, MD  History of Present Illness  [name] is a 58 y.o. female with hx of COPD, current everyday smoker, depression, kidney stones, migraines, abdominal pain who presents with R sided weakness and numbness while watching TV. Reports that she was sitting on the couch at 1800 when these symptoms started.  She was brought in to the ED by EMS. She was noted to have some R sided weakness and numbness. She was brought in to the ED where a code stroke was activated.  CT head w/o contrast with no acute abnormalities.  She continues to report R sided significant numbness as well as mild weakness.  LKW: 1800 Modified rankin score: 1-No significant post stroke disability and can perform usual duties with stroke symptoms IV Thrombolysis: not offered. Too mild to treat at this time. EVT: not offered.   NIHSS components Score: Comment  1a Level of Conscious 0[x]  1[]  2[]  3[]      1b LOC Questions 0[x]  1[]  2[]       1c LOC Commands 0[x]  1[]  2[]       2 Best Gaze 0[x]  1[]  2[]       3 Visual 0[x]  1[]  2[]  3[]      4 Facial Palsy 0[x]  1[]  2[]  3[]      5a Motor Arm - left 0[x]  1[]  2[]  3[]  4[]  UN[]    5b Motor Arm - Right 0[x]  1[]  2[]  3[]  4[]  UN[]    6a Motor Leg - Left 0[x]  1[]  2[]  3[]  4[]  UN[]    6b Motor Leg - Right 0[x]  1[]  2[]  3[]  4[]  UN[]    7 Limb Ataxia 0[x]  1[]  2[]  UN[]      8 Sensory 0[]  1[]  2[x]  UN[]      9 Best Language 0[x]  1[]  2[]  3[]      10 Dysarthria 0[x]  1[]  2[]  UN[]      11 Extinct. and Inattention 0[x]  1[]  2[]       TOTAL: 2      ROS  Comprehensive ROS performed and pertinent positives documented in HPI   Past History   Past Medical History:  Diagnosis Date   Abscess    rectal   Anxiety    Arthritis    back; hips (11/14/2014)   Bipolar disorder (HCC)    Chronic  back pain    all over   Chronic bronchitis (HCC)    I get it q yr (11/14/2014)   COPD (chronic obstructive pulmonary disease) (HCC)    Depression    Dyspnea    W/ EXERTION    Emphysema lung (HCC)    Endometriosis    Fibromyalgia    History of kidney stones    ILD (interstitial lung disease) (HCC) 11/2013   Migraine    3 in the last 2 wks (11/14/2014)   Ovarian cyst    Pulmonary infiltrates     Past Surgical History:  Procedure Laterality Date   ANTERIOR CERVICAL DECOMP/DISCECTOMY FUSION  10/03/2015   Procedure: ANTERIOR CERVICAL DISCECTOMY  AND FUSION C5-6 WITH PLATE AND SCREWS, LOCAL AND ALLOGRAFT BONE GRAFT;  Surgeon: Lynwood FORBES Better, MD;  Location: [facility] OR;  Service: Orthopedics;;   APPENDECTOMY  1993   BREAST BIOPSY Left 2008   BREAST LUMPECTOMY Left 2008   benign   CYST EXCISION PERINEAL Left 01/2012   thelbert 02/15/2012   EXAMINATION UNDER ANESTHESIA  02/15/2012  Procedure: EXAM UNDER ANESTHESIA;  Surgeon: Debby LABOR. Cornett, MD;  Location: [facility];  Service: General;  Laterality: Left;  Excision of perineal cyst   FOOT SURGERY Left 08/17/2016   gastroc recession and plantar fascia release   LAPAROSCOPIC CHOLECYSTECTOMY  08/2007   LAPAROSCOPIC LYSIS OF ADHESIONS  06/2010   thelbert 06/18/2010   LEFT OOPHORECTOMY Left 08/2013   OVARIAN CYST REMOVAL  X 2-3   SHOULDER CLOSED REDUCTION Left 10/03/2015   Procedure: CLOSED MANIPULATION SHOULDER CLOSED  AND INJECTION WITH LOCAL ANESTHETIC;  Surgeon: Lynwood FORBES Better, MD;  Location: [facility] OR;  Service: Orthopedics;  Laterality: Left;   TRANSOBTURATOR SLING  09/2008   Transobturator mid urethral sling, cystoscopy, laparoscopy w/LOA/notes 03/04/2011   TUBAL LIGATION  1993   VAGINAL HYSTERECTOMY  1993   VIDEO BRONCHOSCOPY Bilateral 02/15/2014   Procedure: VIDEO BRONCHOSCOPY WITH FLUORO;  Surgeon: Carolynne Allan, MD;  Location: [facility];  Service: Cardiopulmonary;  Laterality: Bilateral;    Family History: Family History   Problem Relation Age of Onset   Cancer Father        lung   COPD Father    Early death Father    Hypertension Sister    Cancer Maternal Aunt        LUNG CANCER   Other Neg Hx     Social History  reports that she quit smoking about 7 years ago. Her smoking use included cigarettes. She started smoking about 39 years ago. She has a 16 pack-year smoking history. She has never used smokeless tobacco. She reports current drug use. Drug: Cocaine. She reports that she does not drink alcohol.  Allergies[1]  Medications  Current Medications[2]  Vitals   Vitals:   10/26/24 1938 10/26/24 1940 10/26/24 1942  BP:   (!) 136/114  Pulse:   68  Resp:   19  Temp:   98 F (36.7 C)  TempSrc:   Axillary  SpO2: 96%  98%  Weight:  71.7 kg   Height:  5' 3 (1.6 m)     Body mass index is 27.99 kg/m.   Physical Exam   General: Laying comfortably in bed; in no acute distress.  HENT: Normal oropharynx and mucosa. Normal external appearance of ears and nose.  Neck: Supple, no pain or tenderness  CV: No JVD. No peripheral edema.  Pulmonary: Symmetric Chest rise. Normal respiratory effort.  Abdomen: Soft to touch, non-tender.  Ext: No cyanosis, edema, or deformity  Skin: No rash. Normal palpation of skin.   Musculoskeletal: Normal digits and nails by inspection. No clubbing.   Neurologic Examination  Mental status/Cognition: Alert, oriented to self, place, month and year, good attention.  Speech/language: Fluent, comprehension intact, object naming intact, repetition intact.  Cranial nerves:   CN II Pupils equal and reactive to light, no VF deficits    CN III,IV,VI EOM intact, no gaze preference or deviation, no nystagmus    CN V R facial numbness   CN VII no asymmetry, no nasolabial fold flattening    CN VIII normal hearing to speech    CN IX & X normal palatal elevation, no uvular deviation    CN XI 5/5 head turn and 5/5 shoulder shrug bilaterally    CN XII midline tongue protrusion     Motor:  Muscle bulk: normal, tone normal, pronator drift noted in RUE. tremor none Mvmt Root Nerve  Muscle Right Left Comments  SA C5/6 Ax Deltoid 4 5   EF C5/6 Mc Biceps  EE C6/7/8 Rad Triceps     WF C6/7 Med FCR     WE C7/8 PIN ECU     F Ab C8/T1 U ADM/FDI 4 5   HF L1/2/3 Fem Illopsoas 4+ 5   KE L2/3/4 Fem Quad     DF L4/5 D Peron Tib Ant 5 5   PF S1/2 Tibial Grc/Sol 5 5    Sensation:  Light touch Decreased significantly in RUE and RLE to touch.   Pin prick    Temperature    Vibration   Proprioception    Coordination/Complex Motor:  - Finger to Nose intact BL - Heel to shin intact BL - Rapid alternating movement are slowed on the right - Gait: base width wide, arm swing poor, able to toe walk and heel walk.  Labs/Imaging/Neurodiagnostic studies   CBC:  Recent Labs  Lab 11-17-24 1950 11-17-2024 1956  WBC 6.1  --   NEUTROABS 3.5  --   HGB 13.4 13.3  HCT 39.1 39.0  MCV 90.1  --   PLT 318  --    Basic Metabolic Panel:  Lab Results  Component Value Date   NA 143 November 17, 2024   K 4.1 2024-11-17   CO2 23 09/30/2015   GLUCOSE 95 17-Nov-2024   BUN 12 11-17-2024   CREATININE 0.70 11-17-2024   CALCIUM  9.6 09/30/2015   GFRNONAA >60 09/30/2015   GFRAA >60 09/30/2015   Lipid Panel: No results found for: LDLCALC HgbA1c: No results found for: HGBA1C Urine Drug Screen:     Component Value Date/Time   LABOPIA NONE DETECTED 06/19/2014 1523   COCAINSCRNUR NONE DETECTED 06/19/2014 1523   LABBENZ NONE DETECTED 06/19/2014 1523   AMPHETMU NONE DETECTED 06/19/2014 1523   THCU NONE DETECTED 06/19/2014 1523   LABBARB NONE DETECTED 06/19/2014 1523    Alcohol Level     Component Value Date/Time   ETH <11 06/19/2014 1438   INR  Lab Results  Component Value Date   INR 0.9 17-Nov-2024   APTT  Lab Results  Component Value Date   APTT 31 2024-11-17   AED levels: No results found for: PHENYTOIN, ZONISAMIDE, LAMOTRIGINE , LEVETIRACETA  CT Head without  contrast(Personally reviewed): CTH was negative for a large hypodensity concerning for a large territory infarct or hyperdensity concerning for an ICH  CT angio Head and Neck with contrast(Personally reviewed): Pending  MRI Brain(Personally reviewed): Pending  ASSESSMENT   DEANGELA RANDLEMAN is a 58 y.o. female p/w acute R sided numbness and weakness. Episode sudden in onset and occurred while she was sitting on the couch and watching TV.  She does have significant stroke risk factors including cocaine use, current everyday smoker.  Reports she last used cocaine 30 days ago to me, but reported to staff that she used it yesterday.  On exam, she has right-sided numbness with slowed rapid alternating movement in right upper extremity.  Overall, symptoms are nondisabling at this time and therefore, she was not offered TNKase.  She was closely monitored while within the 4.5-hour TNKase window.  Symptoms did not worsen.  She will be admitted to medicine service and stroke team to follow.  RECOMMENDATIONS   - Frequent Neuro checks per stroke unit protocol - Recommend brain imaging with MRI Brain without contrast - Recommend Vascular imaging with CT angio of the head and neck - Recommend obtaining TTE - Recommend obtaining Lipid panel with LDL - Please start statin if LDL > 70 - Recommend HbA1c to evaluate for diabetes and how well  it is controlled. - Antithrombotic -aspirin  325 mg once along with Plavix  300 mg once, followed by aspirin  81 mg daily and Plavix  75 mg daily for 21 days, followed by aspirin  81 mg daily alone. - Recommend DVT ppx - SBP goal - permissive hypertension first 24 h < 220/110. Held home meds.  - Recommend Telemetry monitoring for arrythmia - Recommend bedside swallow screen prior to PO intake. - Stroke education booklet - Recommend PT/OT/SLP consult - Recommend Urine Tox  screen.  ______________________________________________________________________    Signed, Nasrin Lanzo, MD [facility]     [1]  Allergies Allergen Reactions   Cortisone Acetate Shortness Of Breath and Other (See Comments)    Makes patient feel as if she is choking.   Vicodin [Hydrocodone -Acetaminophen ] Itching  [2]  Current Facility-Administered Medications:    aspirin  tablet 325 mg, 325 mg, Oral, Once, Noa Constante, MD   clopidogrel  (PLAVIX ) tablet 300 mg, 300 mg, Oral, Once, Balthazar Dooly, MD  Current Outpatient Medications:    acetaminophen -codeine  (TYLENOL  #4) 300-60 MG tablet, Take 1 tablet by mouth every 4 (four) hours as needed for moderate pain., Disp: 30 tablet, Rfl: 0   ALPRAZolam  (XANAX ) 1 MG tablet, Take 1 mg by mouth 4 (four) times daily. , Disp: , Rfl:    amphetamine -dextroamphetamine  (ADDERALL XR) 25 MG 24 hr capsule, Take 1 capsule by mouth 2 (two) times daily. , Disp: , Rfl: 0   cyclobenzaprine  (FLEXERIL ) 10 MG tablet, Take 1 tablet (10 mg total) by mouth 2 (two) times daily as needed for muscle spasms., Disp: 20 tablet, Rfl: 0   diazepam  (VALIUM ) 5 MG tablet, Take 1 by mouth 1 hour  pre-procedure with very light food. May bring 2nd tablet to appointment., Disp: 2 tablet, Rfl: 0   doxepin (SINEQUAN) 150 MG capsule, TK 2 CS PO 1 HOUR B BED, Disp: , Rfl: 0   FLUoxetine  (PROZAC ) 40 MG capsule, Take 40 mg by mouth 2 (two) times daily. , Disp: , Rfl:    HYDROcodone -acetaminophen  (NORCO) 5-325 MG tablet, Take 1-2 tablets by mouth daily as needed., Disp: 10 tablet, Rfl: 0   ibuprofen  (ADVIL ,MOTRIN ) 800 MG tablet, Take 1 tablet (800 mg total) by mouth 3 (three) times daily., Disp: 21 tablet, Rfl: 0   lamoTRIgine  (LAMICTAL ) 200 MG tablet, Take 200 mg by mouth 2 (two) times daily. , Disp: , Rfl:    LYRICA  200 MG capsule, Take 1 capsule by mouth 2 (two) times daily., Disp: , Rfl: 1   meclizine  (ANTIVERT ) 12.5 MG tablet, Take 1 tablet (12.5 mg  total) by mouth 3 (three) times daily as needed for dizziness., Disp: 30 tablet, Rfl: 0   methocarbamol  (ROBAXIN ) 500 MG tablet, Take 1 tablet (500 mg total) by mouth 2 (two) times daily as needed., Disp: 20 tablet, Rfl: 0   oxyCODONE -acetaminophen  (PERCOCET) 5-325 MG tablet, Take 1 tablet by mouth daily as needed for severe pain., Disp: 10 tablet, Rfl: 0   QUEtiapine  (SEROQUEL ) 200 MG tablet, , Disp: , Rfl: 0   rizatriptan (MAXALT) 10 MG tablet, Take 20 mg by mouth daily as needed for migraine. May repeat in 2 hours if needed, Disp: , Rfl:    traMADol  (ULTRAM ) 50 MG tablet, Take 1-2 tablets (50-100 mg total) by mouth 2 (two) times daily as needed., Disp: 30 tablet, Rfl: 0   traMADol  (ULTRAM ) 50 MG tablet, Take 1-2 tablets (50-100 mg total) by mouth daily as needed., Disp: 20 tablet, Rfl: 0   VRAYLAR capsule, TK ONE C PO  QAM, Disp: , Rfl: 0   zolpidem  (AMBIEN ) 10 MG tablet, Take 10 mg by mouth at bedtime. May take 1-1 tablets at hs prn, Disp: , Rfl:

## 2024-10-26 NOTE — ED Triage Notes (Signed)
 Pt bib [facility] from home. Pt was watching tv when her right side went numb and painful at 1845. No hx of stroke. Used cocaine yesterday. Compares feeling to panic attack. No blood thinners.  140/80 72 113 20LFA

## 2024-10-27 ENCOUNTER — Inpatient Hospital Stay (HOSPITAL_COMMUNITY)

## 2024-10-27 DIAGNOSIS — G43809 Other migraine, not intractable, without status migrainosus: Secondary | ICD-10-CM

## 2024-10-27 DIAGNOSIS — I639 Cerebral infarction, unspecified: Secondary | ICD-10-CM | POA: Diagnosis not present

## 2024-10-27 DIAGNOSIS — I10 Essential (primary) hypertension: Secondary | ICD-10-CM

## 2024-10-27 LAB — ECHOCARDIOGRAM COMPLETE
AR max vel: 2.82 cm2
AV Area VTI: 2.64 cm2
AV Area mean vel: 2.77 cm2
AV Mean grad: 4 mmHg
AV Peak grad: 8.8 mmHg
Ao pk vel: 1.48 m/s
Area-P 1/2: 2.68 cm2
Calc EF: 66.3 %
Height: 63 in
S' Lateral: 2.9 cm
Single Plane A2C EF: 64 %
Single Plane A4C EF: 68.1 %
Weight: 2528 [oz_av]

## 2024-10-27 LAB — HEMOGLOBIN A1C
Hgb A1c MFr Bld: 5.6 % (ref 4.8–5.6)
Mean Plasma Glucose: 114.02 mg/dL

## 2024-10-27 MED ORDER — MAGNESIUM SULFATE IN D5W 1-5 GM/100ML-% IV SOLN: 1.0000 g | Freq: Four times a day (QID) | INTRAVENOUS | Status: DC | PRN

## 2024-10-27 MED ORDER — TOPIRAMATE 100 MG PO TABS: 100.0000 mg | ORAL_TABLET | Freq: Every day | ORAL | Status: DC | PRN

## 2024-10-27 MED ORDER — ENOXAPARIN SODIUM 40 MG/0.4ML IJ SOSY
40.0000 mg | PREFILLED_SYRINGE | Freq: Every day | INTRAMUSCULAR | Status: DC
  Administered 2024-10-27 – 2024-10-28 (×2): 40 mg via SUBCUTANEOUS
  Filled 2024-10-27 (×2): qty 0.4

## 2024-10-27 MED ORDER — IOHEXOL 350 MG/ML SOLN
75.0000 mL | Freq: Once | INTRAVENOUS | Status: AC | PRN
  Administered 2024-10-27: 75 mL via INTRAVENOUS

## 2024-10-27 MED ORDER — CLOPIDOGREL BISULFATE 75 MG PO TABS: 75.0000 mg | ORAL_TABLET | Freq: Every day | ORAL | Status: DC

## 2024-10-27 MED ORDER — ASPIRIN 325 MG PO TBEC: 325.0000 mg | DELAYED_RELEASE_TABLET | Freq: Once | ORAL | Status: DC

## 2024-10-27 MED ORDER — PROCHLORPERAZINE EDISYLATE 10 MG/2ML IJ SOLN
10.0000 mg | Freq: Four times a day (QID) | INTRAMUSCULAR | Status: DC | PRN
  Administered 2024-10-27: 10 mg via INTRAVENOUS
  Filled 2024-10-27: qty 2

## 2024-10-27 MED ORDER — ACETAMINOPHEN 160 MG/5ML PO SOLN: 650.0000 mg | ORAL | Status: DC | PRN

## 2024-10-27 MED ORDER — HYDROXYZINE HCL 25 MG PO TABS
25.0000 mg | ORAL_TABLET | Freq: Three times a day (TID) | ORAL | Status: DC
  Administered 2024-10-27 – 2024-10-28 (×5): 25 mg via ORAL
  Filled 2024-10-27 (×5): qty 1

## 2024-10-27 MED ORDER — DICYCLOMINE HCL 20 MG PO TABS: 20.0000 mg | ORAL_TABLET | Freq: Four times a day (QID) | ORAL | Status: DC | PRN

## 2024-10-27 MED ORDER — CLOPIDOGREL BISULFATE 300 MG PO TABS: 300.0000 mg | ORAL_TABLET | Freq: Once | ORAL | Status: DC

## 2024-10-27 MED ORDER — QUETIAPINE FUMARATE 100 MG PO TABS
400.0000 mg | ORAL_TABLET | Freq: Every day | ORAL | Status: DC
  Administered 2024-10-27: 400 mg via ORAL
  Filled 2024-10-27: qty 4

## 2024-10-27 MED ORDER — BUPROPION HCL ER (XL) 150 MG PO TB24
300.0000 mg | ORAL_TABLET | Freq: Every day | ORAL | Status: DC
  Administered 2024-10-27: 300 mg via ORAL
  Filled 2024-10-27: qty 2

## 2024-10-27 MED ORDER — ACETAMINOPHEN 325 MG PO TABS
650.0000 mg | ORAL_TABLET | ORAL | Status: DC | PRN
  Administered 2024-10-28: 650 mg via ORAL
  Filled 2024-10-27: qty 2

## 2024-10-27 MED ORDER — KETOROLAC TROMETHAMINE 15 MG/ML IJ SOLN
15.0000 mg | Freq: Four times a day (QID) | INTRAMUSCULAR | Status: AC | PRN
  Administered 2024-10-27 – 2024-10-28 (×3): 15 mg via INTRAVENOUS
  Filled 2024-10-27 (×3): qty 1

## 2024-10-27 MED ORDER — OLANZAPINE-SAMIDORPHAN 10-10 MG PO TABS: 1.0000 | ORAL_TABLET | Freq: Every day | ORAL | Status: DC

## 2024-10-27 MED ORDER — HYDROMORPHONE HCL 1 MG/ML IJ SOLN
0.5000 mg | Freq: Once | INTRAMUSCULAR | Status: AC
  Administered 2024-10-27: 0.5 mg via INTRAVENOUS
  Filled 2024-10-27: qty 1

## 2024-10-27 MED ORDER — PREGABALIN 100 MG PO CAPS
200.0000 mg | ORAL_CAPSULE | Freq: Two times a day (BID) | ORAL | Status: DC
  Administered 2024-10-27 – 2024-10-28 (×3): 200 mg via ORAL
  Filled 2024-10-27 (×3): qty 2

## 2024-10-27 MED ORDER — OXYCODONE HCL 5 MG PO TABS
5.0000 mg | ORAL_TABLET | Freq: Four times a day (QID) | ORAL | Status: DC | PRN
  Administered 2024-10-27: 5 mg via ORAL
  Filled 2024-10-27: qty 1

## 2024-10-27 MED ORDER — CLONAZEPAM 1 MG PO TABS: 1.0000 mg | ORAL_TABLET | Freq: Two times a day (BID) | ORAL | Status: DC | PRN

## 2024-10-27 MED ORDER — SODIUM CHLORIDE 0.9 % IV SOLN: INTRAVENOUS | Status: AC

## 2024-10-27 MED ORDER — DIPHENHYDRAMINE HCL 50 MG/ML IJ SOLN
25.0000 mg | Freq: Four times a day (QID) | INTRAMUSCULAR | Status: DC | PRN
  Administered 2024-10-27: 25 mg via INTRAVENOUS
  Filled 2024-10-27: qty 1

## 2024-10-27 MED ORDER — CARISOPRODOL 350 MG PO TABS
350.0000 mg | ORAL_TABLET | Freq: Every day | ORAL | Status: DC
  Administered 2024-10-28: 350 mg via ORAL
  Filled 2024-10-27: qty 1

## 2024-10-27 MED ORDER — STROKE: EARLY STAGES OF RECOVERY BOOK
Freq: Once | Status: AC
  Administered 2024-10-28: 1
  Filled 2024-10-27: qty 1

## 2024-10-27 MED ORDER — ASPIRIN 81 MG PO TBEC: 81.0000 mg | DELAYED_RELEASE_TABLET | Freq: Every day | ORAL | Status: DC

## 2024-10-27 MED ORDER — ACETAMINOPHEN 650 MG RE SUPP: 650.0000 mg | RECTAL | Status: DC | PRN

## 2024-10-27 MED ORDER — ATORVASTATIN CALCIUM 10 MG PO TABS
20.0000 mg | ORAL_TABLET | Freq: Every day | ORAL | Status: DC
  Administered 2024-10-27: 20 mg via ORAL
  Filled 2024-10-27: qty 2

## 2024-10-27 NOTE — Progress Notes (Signed)
 STROKE TEAM PROGRESS NOTE    INTERIM HISTORY/SUBJECTIVE No acute events overnight.  On further history gathering, patient initially developed binocular blurry vision which is her typical migraine aura, followed by migrating and fluctuating numbness tingling of the right side including the face along with subjective heaviness on that side.  This was followed by throbbing unilateral headache with nausea.  She still endorses right-sided sensory changes and subjective heaviness; however, her strength is 5 out of 5 throughout on exam.   OBJECTIVE  CBC    Component Value Date/Time   WBC 6.1 10/26/2024 1950   RBC 4.34 10/26/2024 1950   HGB 13.3 10/26/2024 1956   HGB 13.6 06/01/2010 0830   HCT 39.0 10/26/2024 1956   HCT 39.5 06/01/2010 0830   PLT 318 10/26/2024 1950   PLT 350 06/01/2010 0830   MCV 90.1 10/26/2024 1950   MCV 92.4 06/01/2010 0830   MCH 30.9 10/26/2024 1950   MCHC 34.3 10/26/2024 1950   RDW 13.1 10/26/2024 1950   RDW 13.3 06/01/2010 0830   LYMPHSABS 2.0 10/26/2024 1950   LYMPHSABS 2.3 06/01/2010 0830   MONOABS 0.4 10/26/2024 1950   MONOABS 0.5 06/01/2010 0830   EOSABS 0.1 10/26/2024 1950   EOSABS 0.2 06/01/2010 0830   BASOSABS 0.1 10/26/2024 1950   BASOSABS 0.0 06/01/2010 0830    BMET    Component Value Date/Time   NA 143 10/26/2024 1956   K 4.1 10/26/2024 1956   CL 107 10/26/2024 1956   CO2 24 10/26/2024 1950   GLUCOSE 95 10/26/2024 1956   BUN 12 10/26/2024 1956   CREATININE 0.70 10/26/2024 1956   CALCIUM  9.4 10/26/2024 1950   GFRNONAA >60 10/26/2024 1950    IMAGING past 24 hours MR BRAIN WO CONTRAST Result Date: 10/27/2024 EXAM: MRI BRAIN WITHOUT CONTRAST 10/27/2024 09:55:00 AM TECHNIQUE: Multiplanar multisequence MRI of the head/brain was performed without the administration of intravenous contrast. COMPARISON: CTA head and neck 10/27/2024, Head CT 10/26/2024. CLINICAL HISTORY: 58 year old female. code stroke  presentation yesterday. FINDINGS: BRAIN AND  VENTRICLES: Preserved intracranial arterial flow voids, stable from prior CTA. Largely normal for age gray and white matter signal throughout the brain. Minimal nonspecific cerebral white matter signal changes, significant doubtful. No cortical encephalomalacia or chronic cerebral blood products identified. No acute infarct. No intracranial hemorrhage. No mass. No midline shift. No hydrocephalus. The sella is unremarkable. Stable compared to CTA earlier today. Motion degraded coronal T2 weighted imaging. ORBITS: No acute abnormality. SINUSES AND MASTOIDS: Paranasal sinuses and mastoids are well aerated. BONES AND SOFT TISSUES: Normal marrow signal. No acute soft tissue abnormality. Negative visible cervical spine. IMPRESSION: 1. No acute intracranial abnormality. 2. Negative for age non contrast MRI appearance of the brain. Electronically signed by: Helayne Hurst MD 10/27/2024 10:27 AM EST RP Workstation: [id number]   CT ANGIO HEAD NECK W WO CM (CODE STROKE) Result Date: 10/27/2024 EXAM: CTA HEAD AND NECK WITHOUT AND WITH 10/27/2024 07:39:56 AM TECHNIQUE: CTA of the head and neck was performed without and with the administration of 75 mL of iohexol  (OMNIPAQUE ) 350 MG/ML injection. Multiplanar 2D and/or 3D reformatted images are provided for review. Automated exposure control, iterative reconstruction, and/or weight based adjustment of the mA/kV was utilized to reduce the radiation dose to as low as reasonably achievable. Stenosis of the internal carotid arteries measured using NASCET criteria. COMPARISON: CT head 10/26/2024. CLINICAL HISTORY: 59 year old female with acute neuro deficit, stroke suspected. FINDINGS: CTA NECK: AORTIC ARCH AND ARCH VESSELS: Tortuous aortic arch with mild atherosclerosis.  3 vessel arch configuration. No dissection or arterial injury. No significant stenosis of the brachiocephalic or subclavian arteries. Proximal right subclavian artery and right vertebral artery origin are normal.  Proximal left subclavian artery and left vertebral artery origin are normal. CERVICAL CAROTID ARTERIES: Tortuous proximal right CCA. Mildly tortuous cervical right ICA. Tortuous right ICA siphon. Normal right ophthalmic artery origin. Minimal right carotid atherosclerosis and no right carotid stenosis. Mildly tortuous left CCA. Mild calcified plaque at the left ICA origin without stenosis. Mildly tortuous cervical left ICA and left ICA siphon. No left carotid stenosis. No dissection or arterial injury. CERVICAL VERTEBRAL ARTERIES: Dominant left vertebral artery without stenosis. Nondominant right vertebral artery is diminutive, functionally terminates and PICA, normal variant. No dissection or arterial injury. LUNGS AND MEDIASTINUM: Negative visible upper chest. SOFT TISSUES: Neck soft tissue spaces appear normal. BONES: The dentition is absent. Cervical ACDF at C5-C6 with solid arthrodesis. No acute abnormality. CTA HEAD: ANTERIOR CIRCULATION: No significant stenosis of the internal carotid arteries. MCA and ACA origins are normal. Dominant left ACA A1 segment (normal variant). Tortuous A1 segments. Normal anterior communicating artery. No aneurysm. POSTERIOR CIRCULATION: Left vertebral artery primarily supplies the basilar artery. Normal SCA and PCA origins. Tortuous right P1 segment. Posterior communicating arteries are diminutive or absent. No aneurysm. OTHER: Major dural venous sinuses are enhancing and appear patent. IMPRESSION: 1. No large vessel occlusion or significant stenosis. Mild for age atherosclerosis. Generalized arterial tortuosity. Electronically signed by: Helayne Hurst MD 10/27/2024 07:53 AM EST RP Workstation: [id number]   CT HEAD CODE STROKE WO CONTRAST Result Date: 10/26/2024 EXAM: CT HEAD WITHOUT CONTRAST 10/26/2024 07:57:31 PM TECHNIQUE: CT of the head was performed without the administration of intravenous contrast. Automated exposure control, iterative reconstruction, and/or weight based  adjustment of the mA/kV was utilized to reduce the radiation dose to as low as reasonably achievable. COMPARISON: CT head 06/19/2014. CLINICAL HISTORY: Neuro deficit, acute, stroke suspected. FINDINGS: BRAIN AND VENTRICLES: No acute hemorrhage. No evidence of acute infarct. No hydrocephalus. No extra-axial collection. No mass effect or midline shift. 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 [place] Stroke Program Early CT (ASPECT) score: Ganglionic (caudate, internal capsule, lentiform nucleus, insula, M1-M3): 7 Supraganglionic (M4-M6): 3 Total: 10 ORBITS: No acute abnormality. SINUSES: No acute abnormality. SOFT TISSUES AND SKULL: No acute soft tissue abnormality. No skull fracture. IMPRESSION: 1. No acute intracranial abnormality. 2. ASPECTS score is 10. 3. Possible cerebellar tonsil ectopia, greater on the right, although the foramen magnum is incompletely visualized. 4. Impression #1 and #2 messaged to Dr. Lindzen at 8:05 PM on 10/26/24. Electronically signed by: Donnice Mania MD 10/26/2024 08:06 PM EST RP Workstation: [id number]    Vitals:   10/27/24 0300 10/27/24 9366 10/27/24 0722 10/27/24 1018  BP: 120/86  131/83 134/75  Pulse: 75  (!) 41 64  Resp: 14  19 18   Temp:  97.7 F (36.5 C)  (!) 97.4 F (36.3 C)  TempSrc:  Oral    SpO2: 98%  97% 100%  Weight:      Height:         PHYSICAL EXAM General:  Alert, well-nourished, well-developed patient in no acute distress Psych:  Mood and affect appropriate for situation CV: Regular rate and rhythm on monitor Respiratory:  Regular, unlabored respirations on room air GI: Abdomen soft and nontender   NEURO:  Mental Status: AA&Ox3, patient is able to give clear and coherent history Speech/Language: speech is without dysarthria or aphasia.  Naming,  repetition, fluency, and comprehension intact.  Cranial Nerves:  II: PERRL. Visual fields full.  III, IV, VI: EOMI. Eyelids  elevate symmetrically.  V: Diminished sensation to light touch on the right V1-V3.  VII: Face is symmetrical resting and smiling VIII: hearing intact to voice. IX, X: Palate elevates symmetrically. Phonation is normal.  KP:Dynloizm shrug 5/5. XII: tongue is midline without fasciculations. Motor: 5/5 strength to all muscle groups tested.  Tone: is normal and bulk is normal Sensation-numbness and tingling in the right upper and lower extremities to light touch.   Coordination: FTN intact bilaterally, HKS: no ataxia in BLE.No drift.  Gait- deferred     ASSESSMENT/PLAN  Ms. SKYLA CHAMPAGNE is a 58 y.o. female with history of migraine headaches, found to be cocaine positive on UDS, who presents with binocular blurry vision which is her typical migraine aura, followed by migrating and fluctuating numbness tingling of the right side including the face along with subjective heaviness on that side.  This was followed by throbbing unilateral headache with nausea.  She still endorses right-sided sensory changes and subjective heaviness; however, her strength is 5 out of 5 throughout on exam.  MRI brain without any evidence of acute stroke.  Her overall presentation is consistent with complex migraine.  I recommend discontinuing aspirin , Plavix .  No indication for 2D echo at this time, suspicion for stroke is low.  Patient appeared comfortable when I examined her at bedside; but if she develops worsening headache, I have entered as needed headache cocktail with magnesium , Benadryl , Compazine  and Toradol .  Please avoid opioids as it can exacerbate rebound headache.  Discussed with patient about potential triggers of migraine and how this should be avoided.  Husband at bedside as well, they are in agreement with plan.  Outpatient headache clinic follow-up.  No further inpatient stroke workup indicated.  Hospital day # 1  Thedford Homans, MD Vascular Neurology  To contact Stroke Continuity  provider, please refer to [email or web address]. After hours, contact General Neurology

## 2024-10-27 NOTE — Evaluation (Signed)
 Occupational Therapy Evaluation Patient Details Name: [name] MRN: [id number] DOB: 14-Sep-1966 Today's Date: 10/27/2024   History of Present Illness   [name] is a 58 y.o. female who presents with R sided weakness and numbness.  Pt with hx of COPD, current everyday smoker, depression, kidney stones, and migrains. She underwent CT of the head followed by CT angiogram of head and neck which ruled out a stroke or LVO.  Patient was seen by neurology, MRI has been ordered and still pending.     Clinical Impressions Pt currently at min assist level for functional transfers and simulated selfcare tasks sit to stand.  Pt still with slight numbness in the RUE and RLE with slight strength and coordination deficits compared to the LUE, but patient still able to use the hand to assist with donning socks and tying gown with increased time.  Pt lives with her spouse and her mom.  Mom cannot provide physical assist but can provide supervision when spouse is working.  Feel based on deficits that patient will benefit from acute care OT at this time in order to help increase independence with ADLs and functional transfers as well as increasing RUE coordination and functional use.  Feel she will benefit from outpatient OT at discharge depending on progress.  She reports that she can setup public transportation.  Will update recommendations as we follow.      If plan is discharge home, recommend the following:   A little help with walking and/or transfers;A little help with bathing/dressing/bathroom;Assistance with cooking/housework;Assist for transportation;Help with stairs or ramp for entrance     Functional Status Assessment   Patient has had a recent decline in their functional status and demonstrates the ability to make significant improvements in function in a reasonable and predictable amount of time.     Equipment Recommendations   Other (comment) (TBA next visit)       Precautions/Restrictions   Precautions Precautions: Fall Precaution/Restrictions Comments: reports right numbness Restrictions Weight Bearing Restrictions Per Provider Order: No     Mobility Bed Mobility Overal bed mobility: Needs Assistance Bed Mobility: Supine to Sit, Sit to Supine     Supine to sit: Supervision Sit to supine: Supervision        Transfers Overall transfer level: Needs assistance Equipment used: None Transfers: Sit to/from Stand, Bed to chair/wheelchair/BSC Sit to Stand: Min assist     Step pivot transfers: Min assist     General transfer comment: Min assist for ambulation in the hallway and back without assistive device.  Occasional LOB to the right and posteriorly.      Balance Overall balance assessment: Needs assistance   Sitting balance-Leahy Scale: Good Sitting balance - Comments: Able to maintain balance with LEs unsupported edge of stretcher Postural control: Posterior lean Standing balance support: No upper extremity supported, During functional activity Standing balance-Leahy Scale: Poor Standing balance comment: Pt needs UE support for balance                           ADL either performed or assessed with clinical judgement   ADL Overall ADL's : Needs assistance/impaired Eating/Feeding: Sitting;Modified independent   Grooming: Sitting;Set up   Upper Body Bathing: Set up;Sitting   Lower Body Bathing: Minimal assistance;Sit to/from stand   Upper Body Dressing : Supervision/safety;Sitting   Lower Body Dressing: Minimal assistance;Sit to/from stand   Toilet Transfer: Minimal assistance;Ambulation   Toileting- Clothing Manipulation and Hygiene: Minimal assistance;Sit  to/from stand       Functional mobility during ADLs: Minimal assistance (no assistive device, occasional hand held assist) General ADL Comments: VS stable throughout session.  Pt with reports of numbness and tingling in the RUE and RLE  throughout.  No significant weakness noted or buckling of RLE with mobility.     Vision Baseline Vision/History: 1 Wears glasses Ability to See in Adequate Light: 0 Adequate Patient Visual Report: Blurring of vision Vision Assessment?: Vision impaired- to be further tested in functional context Additional Comments: To be looked at further during treatment     Perception Perception: Within Functional Limits       Praxis Praxis: [facility]       Pertinent Vitals/Pain Pain Assessment Pain Assessment: Faces Pain Location: head, neck, and stomach Pain Descriptors / Indicators: Aching, Discomfort Pain Intervention(s): Limited activity within patient's tolerance, Repositioned     Extremity/Trunk Assessment Upper Extremity Assessment Upper Extremity Assessment: RUE deficits/detail RUE Deficits / Details: Shoulder, elbow strength for flexion and extension 4/5, grip 3+/4.  Decreased speed with finger to nose compared to the left but both were slower than what would be normal.  Also with slow finger to thumb opposition slightly compared to the left side.  She was able to tie her gown with increased time. RUE Sensation: decreased light touch RUE Coordination: decreased fine motor;decreased gross motor   Lower Extremity Assessment Lower Extremity Assessment: Defer to PT evaluation   Cervical / Trunk Assessment Cervical / Trunk Assessment: Other exceptions Cervical / Trunk Exceptions: flexed neck and some neck pain   Communication Communication Communication: No apparent difficulties   Cognition Arousal: Alert Behavior During Therapy: WFL for tasks assessed/performed Cognition: No apparent impairments                               Following commands: Intact       Cueing  General Comments                  Home Living Family/patient expects to be discharged to:: Private residence Living Arrangements: Spouse/significant other;Parent (spouse works during the day,  mom could not provide physical help) Available Help at Discharge: Family;Available 24 hours/day (mom is there when spouse is at work but cannot provide physical assist) Type of Home: House Home Access: Level entry     Home Layout: One level     Bathroom Shower/Tub: It Trainer: Handicapped height     Home Equipment: None   Additional Comments: Pt drives      Prior Functioning/Environment Prior Level of Function : Independent/Modified Independent                    OT Problem List: Decreased strength;Impaired balance (sitting and/or standing);Decreased coordination;Decreased knowledge of use of DME or AE;Pain   OT Treatment/Interventions: Self-care/ADL training;Patient/family education;Balance training;Therapeutic activities;DME and/or AE instruction;Neuromuscular education;Therapeutic exercise      OT Goals(Current goals can be found in the care plan section)   Acute Rehab OT Goals Patient Stated Goal: Pt wants numbness to go away and her strength to get better OT Goal Formulation: With patient/family Time For Goal Achievement: 11/10/24 Potential to Achieve Goals: Good   OT Frequency:  Min 2X/week       AM-PAC OT 6 Clicks Daily Activity     Outcome Measure Help from another person eating meals?: None Help from another person taking care of personal grooming?: A Little  Help from another person toileting, which includes using toliet, bedpan, or urinal?: A Little Help from another person bathing (including washing, rinsing, drying)?: A Little Help from another person to put on and taking off regular upper body clothing?: A Little Help from another person to put on and taking off regular lower body clothing?: A Little 6 Click Score: 19   End of Session Nurse Communication: Mobility status;Patient requests pain meds  Activity Tolerance: Patient tolerated treatment well Patient left: in bed;with call bell/phone within reach  OT  Visit Diagnosis: Unsteadiness on feet (R26.81);Other abnormalities of gait and mobility (R26.89);Muscle weakness (generalized) (M62.81);Pain;Hemiplegia and hemiparesis Hemiplegia - Right/Left: Right Hemiplegia - dominant/non-dominant: Dominant Hemiplegia - caused by: Unspecified Pain - Right/Left:  (neck)                Time: 9157-9088 OT Time Calculation (min): 29 min Charges:  OT General Charges $OT Visit: 1 Visit OT Evaluation $OT Eval Moderate Complexity: 1 Mod OT Treatments $Self Care/Home Management : 8-22 mins  Lynwood Constant, OTR/L Acute Rehabilitation Services  Office [phone] 10/27/2024

## 2024-10-27 NOTE — Evaluation (Signed)
 Physical Therapy Evaluation Patient Details Name: [name] MRN: [id number] DOB: [birth year] Today's Date: 10/27/2024  History of Present Illness  [name] is a 59 y.o. female who presents with R sided weakness and numbness.  Pt with hx of COPD, current everyday smoker, depression, kidney stones, and migrains. She underwent CT of the head followed by CT angiogram of head and neck which ruled out a stroke or LVO.  Patient was seen by neurology, MRI has been ordered and still pending.  Clinical Impression  Pt presents with admitting diagnosis above. Pt today was able to ambulate in hallway with RW at supervision level. Pt states that she felt much more comfortable using RW. PTA pt was fully independent. Recommend outpatient PT with RW. PT will continue to follow.          If plan is discharge home, recommend the following: A little help with walking and/or transfers;A little help with bathing/dressing/bathroom;Assistance with cooking/housework;Assist for transportation;Help with stairs or ramp for entrance;Supervision due to cognitive status   Can travel by private vehicle        Equipment Recommendations Rolling walker (2 wheels)  Recommendations for Other Services       Functional Status Assessment Patient has had a recent decline in their functional status and demonstrates the ability to make significant improvements in function in a reasonable and predictable amount of time.     Precautions / Restrictions Precautions Precautions: Fall Precaution/Restrictions Comments: reports right numbness Restrictions Weight Bearing Restrictions Per Provider Order: No      Mobility  Bed Mobility Overal bed mobility: Needs Assistance Bed Mobility: Supine to Sit, Sit to Supine     Supine to sit: Supervision Sit to supine: Supervision        Transfers Overall transfer level: Needs assistance Equipment used: Rolling walker (2 wheels) Transfers: Sit to/from Stand Sit  to Stand: Supervision           General transfer comment: Good hand placement    Ambulation/Gait Ambulation/Gait assistance: Supervision Gait Distance (Feet): 75 Feet Assistive device: Rolling walker (2 wheels) Gait Pattern/deviations: Decreased stride length, Step-through pattern Gait velocity: decreased     General Gait Details: Pt able to ambulate to restroom and back with RW. Overall supervision with occasional cues for proximity to RW. Pt states much more stable with RW.  Stairs            Wheelchair Mobility     Tilt Bed    Modified Rankin (Stroke Patients Only)       Balance Overall balance assessment: Needs assistance   Sitting balance-Leahy Scale: Good Sitting balance - Comments: Able to maintain balance with LEs unsupported edge of stretcher Postural control: Posterior lean Standing balance support: No upper extremity supported, During functional activity Standing balance-Leahy Scale: Poor Standing balance comment: Pt needs UE support for balance                             Pertinent Vitals/Pain Pain Assessment Pain Assessment: Faces Faces Pain Scale: Hurts a little bit Pain Location: head, neck, and stomach Pain Descriptors / Indicators: Aching, Discomfort Pain Intervention(s): Monitored during session, Limited activity within patient's tolerance, Repositioned    Home Living Family/patient expects to be discharged to:: Private residence Living Arrangements: Spouse/significant other;Parent (spouse works during the day, mom could not provide physical help) Available Help at Discharge: Family;Available 24 hours/day (mom is there when spouse is at work but cannot provide physical  assist) Type of Home: House Home Access: Level entry       Home Layout: One level Home Equipment: None Additional Comments: Pt drives    Prior Function Prior Level of Function : Independent/Modified Independent             Mobility Comments:  Ind ADLs Comments: Ind     Extremity/Trunk Assessment   Upper Extremity Assessment Upper Extremity Assessment: RUE deficits/detail RUE Deficits / Details: Shoulder, elbow strength for flexion and extension 4/5, grip 3+/4.  Decreased speed with finger to nose compared to the left but both were slower than what would be normal.  Also with slow finger to thumb opposition slightly compared to the left side.  She was able to tie her gown with increased time. RUE Sensation: decreased light touch RUE Coordination: decreased fine motor;decreased gross motor    Lower Extremity Assessment Lower Extremity Assessment: Overall WFL for tasks assessed    Cervical / Trunk Assessment Cervical / Trunk Assessment: Other exceptions Cervical / Trunk Exceptions: flexed neck and some neck pain  Communication   Communication Communication: No apparent difficulties    Cognition Arousal: Alert Behavior During Therapy: WFL for tasks assessed/performed                             Following commands: Intact       Cueing       General Comments General comments (skin integrity, edema, etc.): VSS    Exercises     Assessment/Plan    PT Assessment Patient needs continued PT services  PT Problem List Decreased strength;Decreased activity tolerance;Decreased range of motion;Decreased balance;Decreased mobility;Decreased coordination;Decreased cognition;Decreased knowledge of use of DME;Decreased safety awareness;Decreased knowledge of precautions;Cardiopulmonary status limiting activity       PT Treatment Interventions Gait training;DME instruction;Functional mobility training;Stair training;Therapeutic activities;Therapeutic exercise;Balance training;Neuromuscular re-education;Cognitive remediation    PT Goals (Current goals can be found in the Care Plan section)  Acute Rehab PT Goals Patient Stated Goal: to go home PT Goal Formulation: With patient Time For Goal Achievement:  11/10/24 Potential to Achieve Goals: Good    Frequency Min 2X/week     Co-evaluation               AM-PAC PT 6 Clicks Mobility  Outcome Measure Help needed turning from your back to your side while in a flat bed without using bedrails?: A Little Help needed moving from lying on your back to sitting on the side of a flat bed without using bedrails?: A Little Help needed moving to and from a bed to a chair (including a wheelchair)?: A Little Help needed standing up from a chair using your arms (e.g., wheelchair or bedside chair)?: A Little Help needed to walk in hospital room?: A Little Help needed climbing 3-5 steps with a railing? : A Little 6 Click Score: 18    End of Session Equipment Utilized During Treatment: Gait belt Activity Tolerance: Patient tolerated treatment well Patient left: in bed;with call bell/phone within reach;with family/visitor present Nurse Communication: Mobility status PT Visit Diagnosis: Other abnormalities of gait and mobility (R26.89)    Time: 1140-1156 PT Time Calculation (min) (ACUTE ONLY): 16 min   Charges:   PT Evaluation $PT Eval Moderate Complexity: 1 Mod   PT General Charges $$ ACUTE PT VISIT: 1 Visit         Sueellen NOVAK, PT, DPT Acute Rehab Services [phone]   Vesta Wheeland 10/27/2024, 3:49 PM

## 2024-10-27 NOTE — Progress Notes (Signed)
2D echo complete 

## 2024-10-27 NOTE — Progress Notes (Signed)
 Patient assigned to this RN as pt arrived to the unit at this time

## 2024-10-27 NOTE — H&P (Signed)
 " History and Physical    [name] FMW:[id number] DOB: September 15, 1966 DOA: 10/26/2024  PCP: Gerome Brunet, DO   Chief Complaint:  weakness  HPI: [name] is a 58 y.o. female with medical history significant of bipolar, polysubstance abuse, COPD, hypertension, hyperlipidemia who presents emergency department due to 2 numbness on right side.  Patient states 2 hours prior to presentation she was having unilateral numbness on her right arm and leg as well as weakness.  She felt like her speech was slurred as well as facial droop so she presented to the ER for further assessment.  On arrival she was afebrile and hemodynamically stable.  Labs were obtained on presentation which showed INR 0.9, UDS positive for cocaine, WBC 6.1, hemoglobin 13.4, CMP unrevealing, lipase normal.  Patient underwent CT head which showed no acute findings.  Neurology was consulted.  They recommended further workup for stroke.   Review of Systems: Review of Systems  All other systems reviewed and are negative.    As per HPI otherwise 10 point review of systems negative.   Allergies[1]  Past Medical History:  Diagnosis Date   Abscess    rectal   Anxiety    Arthritis    back; hips (11/14/2014)   Bipolar disorder (HCC)    Chronic back pain    all over   Chronic bronchitis (HCC)    I get it q yr (11/14/2014)   COPD (chronic obstructive pulmonary disease) (HCC)    Depression    Dyspnea    W/ EXERTION    Emphysema lung (HCC)    Endometriosis    Fibromyalgia    History of kidney stones    ILD (interstitial lung disease) (HCC) 11/2013   Migraine    3 in the last 2 wks (11/14/2014)   Ovarian cyst    Pulmonary infiltrates     Past Surgical History:  Procedure Laterality Date   ANTERIOR CERVICAL DECOMP/DISCECTOMY FUSION  10/03/2015   Procedure: ANTERIOR CERVICAL DISCECTOMY  AND FUSION C5-6 WITH PLATE AND SCREWS, LOCAL AND ALLOGRAFT BONE GRAFT;  Surgeon: Lynwood FORBES Better, MD;  Location: [facility] OR;   Service: Orthopedics;;   APPENDECTOMY  1993   BREAST BIOPSY Left 2008   BREAST LUMPECTOMY Left 2008   benign   CYST EXCISION PERINEAL Left 01/2012   thelbert 02/15/2012   EXAMINATION UNDER ANESTHESIA  02/15/2012   Procedure: EXAM UNDER ANESTHESIA;  Surgeon: Debby A. Cornett, MD;  Location: [facility];  Service: General;  Laterality: Left;  Excision of perineal cyst   FOOT SURGERY Left 08/17/2016   gastroc recession and plantar fascia release   LAPAROSCOPIC CHOLECYSTECTOMY  08/2007   LAPAROSCOPIC LYSIS OF ADHESIONS  06/2010   thelbert 06/18/2010   LEFT OOPHORECTOMY Left 08/2013   OVARIAN CYST REMOVAL  X 2-3   SHOULDER CLOSED REDUCTION Left 10/03/2015   Procedure: CLOSED MANIPULATION SHOULDER CLOSED  AND INJECTION WITH LOCAL ANESTHETIC;  Surgeon: Lynwood FORBES Better, MD;  Location: [facility] OR;  Service: Orthopedics;  Laterality: Left;   TRANSOBTURATOR SLING  09/2008   Transobturator mid urethral sling, cystoscopy, laparoscopy w/LOA/notes 03/04/2011   TUBAL LIGATION  1993   VAGINAL HYSTERECTOMY  1993   VIDEO BRONCHOSCOPY Bilateral 02/15/2014   Procedure: VIDEO BRONCHOSCOPY WITH FLUORO;  Surgeon: Carolynne Allan, MD;  Location: [facility];  Service: Cardiopulmonary;  Laterality: Bilateral;     reports that she quit smoking about 7 years ago. Her smoking use included cigarettes. She started smoking about 39 years ago. She  has a 16 pack-year smoking history. She has never used smokeless tobacco. She reports current drug use. Drug: Cocaine. She reports that she does not drink alcohol.  Family History  Problem Relation Age of Onset   Cancer Father        lung   COPD Father    Early death Father    Hypertension Sister    Cancer Maternal Aunt        LUNG CANCER   Other Neg Hx     Prior to Admission medications  Medication Sig Start Date End Date Taking? Authorizing Provider  albuterol  (VENTOLIN  HFA) 108 (90 Base) MCG/ACT inhaler Inhale 2 puffs into the lungs 2 (two) times daily. 06/12/24  Yes  [provider]  atorvastatin  (LIPITOR) 20 MG tablet Take 20 mg by mouth at bedtime. 08/19/24  Yes [provider]  budesonide -glycopyrrolate -formoterol  (BREZTRI) 160-9-4.8 MCG/ACT AERO inhaler Inhale 2 puffs into the lungs 2 (two) times daily as needed.   Yes [provider]  buPROPion  (WELLBUTRIN  XL) 300 MG 24 hr tablet Take 300 mg by mouth at bedtime. 09/18/24  Yes [provider]  carisoprodol  (SOMA ) 350 MG tablet Take 350 mg by mouth at bedtime. 09/29/24  Yes [provider]  clonazePAM  (KLONOPIN ) 1 MG tablet Take 1 mg by mouth 2 (two) times daily as needed. 09/07/24  Yes [provider]  dicyclomine  (BENTYL ) 20 MG tablet Take 20 mg by mouth 4 (four) times daily as needed for spasms.   Yes [provider]  hydrOXYzine  (ATARAX ) 25 MG tablet Take 25 mg by mouth in the morning, at noon, in the evening, and at bedtime. 09/07/24  Yes [provider]  LYBALVI  10-10 MG TABS Take 1 tablet by mouth at bedtime. 10/26/24  Yes [provider]  pregabalin  (LYRICA ) 200 MG capsule Take 200 mg by mouth 2 (two) times daily.   Yes [provider]  promethazine  (PHENERGAN ) 12.5 MG tablet Take 12.5-25 mg by mouth every 8 (eight) hours as needed. 05/22/24  Yes [provider]  QUEtiapine  (SEROQUEL ) 400 MG tablet Take 400 mg by mouth at bedtime. 09/19/24  Yes [provider]  SUMAtriptan  (IMITREX ) 20 MG/ACT nasal spray Place 20 mg into the nose every 2 (two) hours as needed for migraine or headache. May repeat in 2 hours if headache persists or recurs.   Yes [provider]  topiramate  (TOPAMAX ) 100 MG tablet Take 100 mg by mouth daily as needed (migraine).   Yes [provider]    Physical Exam: Vitals:   10/27/24 0106 10/27/24 0112 10/27/24 0246 10/27/24 0300  BP: (!) 145/100   120/86  Pulse: 64 66  75  Resp:  16  14  Temp:   98.1 F (36.7 C)   TempSrc:      SpO2: 97% 96%  98%   Weight:      Height:       Physical Exam Constitutional:      Appearance: She is normal weight.  HENT:     Head: Normocephalic.     Nose: Nose normal.     Mouth/Throat:     Mouth: Mucous membranes are moist.     Pharynx: Oropharynx is clear.  Eyes:     Conjunctiva/sclera: Conjunctivae normal.     Pupils: Pupils are equal, round, and reactive to light.  Cardiovascular:     Rate and Rhythm: Normal rate and regular rhythm.     Pulses: Normal pulses.  Pulmonary:     Breath  sounds: Normal breath sounds.  Abdominal:     General: Abdomen is flat. Bowel sounds are normal.  Musculoskeletal:        General: Normal range of motion.  Skin:    General: Skin is warm.     Capillary Refill: Capillary refill takes less than 2 seconds.  Neurological:     General: No focal deficit present.     Mental Status: She is alert and oriented to person, place, and time.     Motor: Weakness present.       Labs on Admission: I have personally reviewed the patients's labs and imaging studies.  Assessment/Plan Principal Problem:   CVA (cerebral vascular accident) [facility])   # Possible CVA - Patient presented with right arm weakness and numbness - Neurology consulted recommend stroke workup  Plan: Order MR CTA head and neck Therapy evaluation Stratification Start aspirin  and Plavix   # Hyperlipidemia-continue Lipitor  # Depression-continue Wellbutrin   # Anxiety-continue Klonopin   # Mood disorder-continue olanzapine  sdamendorphin, seroquel   # Chronic pain-continue Lyrica   # Migraines-continue Topamax    Admission status: Inpatient Telemetry  Certification: The appropriate patient status for this patient is INPATIENT. Inpatient status is judged to be reasonable and necessary in order to provide the required intensity of service to ensure the patient's safety. The patient's presenting symptoms, physical exam findings, and initial radiographic and laboratory data in the context of their  chronic comorbidities is felt to place them at high risk for further clinical deterioration. Furthermore, it is not anticipated that the patient will be medically stable for discharge from the hospital within 2 midnights of admission.   * I certify that at the point of admission it is my clinical judgment that the patient will require inpatient hospital care spanning beyond 2 midnights from the point of admission due to high intensity of service, high risk for further deterioration and high frequency of surveillance required.DEWAINE Lamar Dess MD [facility] If 7PM-7AM, please contact night-coverage [email or web address]  10/27/2024, 6:03 AM        [1]  Allergies Allergen Reactions   Corticosteroids Anaphylaxis, Swelling and Other (See Comments)    Other Reaction(s): felt like she was choking, swelling of throat and lips, SOB, difficulty breathing   Vicodin [Hydrocodone -Acetaminophen ] Hives, Itching and Other (See Comments)    Severe welts all over body, itching without relief   "

## 2024-10-27 NOTE — Progress Notes (Addendum)
 [name] is a 58 y.o. female with hx of COPD, current everyday smoker, depression, kidney stones, migraines, abdominal pain who presents with R sided weakness and numbness while watching TV. Reports that she was sitting on the couch at 1800 when these symptoms started.  Upon arrival to ED, she was hemodynamically stable, she was brought into the ED as code stroke, underwent CT head followed by CT angiogram of head and neck which ruled out a stroke or LVO.  Patient was seen by neurology, MRI has been ordered and still pending.  Patient seen and examined in the ED.  Husband at the bedside.  She is working with OT.  She complains of weakness, tingling and numbness on the right side of the body as well as headache and neck pain on the right side the body and she says that there has been no improvement in her symptoms.  No problem with swallowing or speech or vision.  She received fentanyl  with no improvement in headache followed by Dilaudid  which improved briefly.  She is requesting some pain medications.  I am starting her on oxycodone  5 mg every 6 hours as needed for 3 doses maximum.  Patient has been given aspirin  and Plavix  in the ED.  I have thoroughly reviewed patient's vitals, H&P, orders.  Hemoglobin A1c within normal range, lipid panel is pending however she has been started on atorvastatin  20 mg however I anticipate high LDL and likely need of higher dose/high intensity of statin.  UDS positive for cocaine.  Counseling provided for quitting cocaine and smoking in detail and explained 7 minutes on that.  Allow permissive hypertension however blood pressure is within normal range right now. On examination, she appears to have 4/5 power in right upper and lower extremity. Total time spent 39 minutes

## 2024-10-27 NOTE — Progress Notes (Signed)
 This RN to the pt's bedside to obtain admitting VS, place on tele.  Pt A+Ox4, no distress noted.  Pt still states some numbness/tingling to her right extremities.  Discussed with pt [id number].  She states she has a headache, but fell asleep quickly while this RN was in the room.

## 2024-10-27 NOTE — ED Notes (Signed)
(-)  NIH.

## 2024-10-28 ENCOUNTER — Other Ambulatory Visit (HOSPITAL_COMMUNITY): Payer: Self-pay

## 2024-10-28 DIAGNOSIS — G43909 Migraine, unspecified, not intractable, without status migrainosus: Secondary | ICD-10-CM

## 2024-10-28 LAB — LIPID PANEL
Cholesterol: 224 mg/dL — ABNORMAL HIGH (ref 0–200)
HDL: 49 mg/dL
LDL Cholesterol: 161 mg/dL — ABNORMAL HIGH (ref 0–99)
Total CHOL/HDL Ratio: 4.5 ratio
Triglycerides: 70 mg/dL
VLDL: 14 mg/dL (ref 0–40)

## 2024-10-28 MED ORDER — KETOROLAC TROMETHAMINE 15 MG/ML IJ SOLN
15.0000 mg | Freq: Once | INTRAMUSCULAR | Status: AC
  Administered 2024-10-28: 15 mg via INTRAVENOUS
  Filled 2024-10-28: qty 1

## 2024-10-28 NOTE — Progress Notes (Signed)
 Discharge   Patient expressed verbal understanding of discharge POC.   Patient given time to ask any questions.  Additional education included in AVS.  Alert oriented in good spirits.   Tele and PIV removed.  Removed by Primary RN.  Pressure dressings intact.  CCMD/[place]   Waiting for RW from [employer].   Transferring to lounge

## 2024-10-28 NOTE — TOC Transition Note (Signed)
 Transition of Care [facility]) - Discharge Note   Patient Details  Name: [name] MRN: [id number] Date of Birth: 03/19/1966  [facility]) CM/SW Contact:  Marval Gell, RN Phone Number: 10/28/2024, 3:06 PM   Clinical Narrative:     Spoke to patient to discuss DC needs. Agreed upon OP referral and RW.  Referral placed to [facility] [address] and RW to be delivered to the room through [employer]       Patient Goals and CMS Choice            Discharge Placement                       Discharge Plan and Services Additional resources added to the After Visit Summary for                                       Social Drivers of Health (SDOH) Interventions SDOH Screenings   Food Insecurity: Patient Declined (10/27/2024)  Tobacco Use: Medium Risk (10/26/2024)     Readmission Risk Interventions     No data to display

## 2024-10-28 NOTE — Plan of Care (Signed)
 Is discharged and leaving for home accompanied by her husband and nurse tech. Problem: Education: Goal: Knowledge of disease or condition will improve Outcome: Progressing   Problem: Education: Goal: Knowledge of secondary prevention will improve (MUST DOCUMENT ALL) Outcome: Progressing   Problem: Education: Goal: Knowledge of patient specific risk factors will improve (DELETE if not current risk factor) Outcome: Progressing   Problem: Ischemic Stroke/TIA Tissue Perfusion: Goal: Complications of ischemic stroke/TIA will be minimized Outcome: Progressing   Problem: Coping: Goal: Will verbalize positive feelings about self Outcome: Progressing   Problem: Coping: Goal: Will identify appropriate support needs Outcome: Progressing   Problem: Self-Care: Goal: Ability to participate in self-care as condition permits will improve Outcome: Progressing   Problem: Health Behavior/Discharge Planning: Goal: Goals will be collaboratively established with patient/family Outcome: Progressing   Problem: Self-Care: Goal: Verbalization of feelings and concerns over difficulty with self-care will improve Outcome: Progressing   Problem: Self-Care: Goal: Ability to communicate needs accurately will improve Outcome: Progressing   Problem: Nutrition: Goal: Dietary intake will improve Outcome: Progressing

## 2024-10-28 NOTE — Discharge Instructions (Signed)
 [name]

## 2024-10-28 NOTE — Discharge Summary (Signed)
 "  Physician Discharge Summary  TRAN RANDLE FMW:[id number] DOB: Sep 12, 1966 DOA: 10/26/2024  PCP: Gerome Brunet, DO  Admit date: 10/26/2024 Discharge date: 10/28/2024  Admitted from: home Discharge disposition: Home  Recommendations at discharge:  Continue prior neuropsych meds.  Continue to follow with psychiatry as an outpatient. Stop drug abuse   Subjective: Patient was seen and examined this afternoon. Pleasant middle-aged Caucasian female.  Sitting up in bed.  Not in distress.  Husband at bedside. Feels better.  Feels ready to go home. Questions answered to satisfaction.  Brief narrative: BREZLYN MANRIQUE is a 58 y.o. female with PMH significant for HTN, HLD, COPD, current everyday smoker, polysubstance abuse, bipolar disorder, migraine 12/26, patient presented to the ED with complaint of numbness on right arm, right leg, slurring of speech, facial droop.  Symptoms started 2 hours prior to presentation while she was sitting on the couch watching TV.  In the ED, patient was afebrile, hemodynamically stable UDS positive for cocaine CT head did not show any acute findings.  Neurology was consulted.  Per neurology note, TNKase was not offered because symptoms were not disabling. Admitted to [facility]  CT angio head and neck did not show any large vessel occlusion or significant stenosis. MRI brain did not show any acute intracranial abnormality  Hospital course: Acute migraine flareup CVA ruled out Patient presented with right arm weakness and numbness.  Per neurology note, patient initially deferred binocular blurry vision which is typical migraine aura, followed by migrating and fluctuating numbness/tingling of the right side including the face along with subjective heaviness on that side. On neuroexam, she was noted to have normal strength in her extremities. Initially suspected of CVA but per neurology, her overall presentation is consistent with complex migraine.  Stroke  workup and antiplatelets were not recommended Given headache cocktail PRN with magnesium , Benadryl , Compazine  and Toradol  To avoid opiates as it can exacerbate rebound headache Outpatient headache clinic follow-up Continue Topamax  as before  HLD Continue Lipitor  Anxiety/depression Since patient is on multiple neuropsych medicines at home which include scheduled Wellbutrin , Atarax , Klonopin , Seroquel , Soma , Lyrica  and PRN olanzapine /samidorphin, Topamax  Patient follows-up with psychiatry as an outpatient every month.  Polysubstance abuse UDS positive for cocaine.  Counseled to quit.  Goals of care   Code Status: Full Code   Diet:  Diet Order             Diet general           Diet Carb Modified Room service appropriate? Yes  Diet effective now                   Nutritional status:  Body mass index is 27.99 kg/m.       Wounds:  -    Discharge Medications:   Allergies as of 10/28/2024       Reactions   Corticosteroids Anaphylaxis, Swelling, Other (See Comments)   Other Reaction(s): felt like she was choking, swelling of throat and lips, SOB, difficulty breathing   Vicodin [hydrocodone -acetaminophen ] Hives, Itching, Other (See Comments)   Severe welts all over body, itching without relief        Medication List     TAKE these medications    albuterol  108 (90 Base) MCG/ACT inhaler Commonly known as: VENTOLIN  HFA Inhale 2 puffs into the lungs 2 (two) times daily.   atorvastatin  20 MG tablet Commonly known as: LIPITOR Take 20 mg by mouth at bedtime.   budesonide -glycopyrrolate -formoterol  160-9-4.8 MCG/ACT Aero inhaler Commonly known  asBETHA BLIZZARD Inhale 2 puffs into the lungs 2 (two) times daily as needed.   buPROPion  300 MG 24 hr tablet Commonly known as: WELLBUTRIN  XL Take 300 mg by mouth at bedtime.   carisoprodol  350 MG tablet Commonly known as: SOMA  Take 350 mg by mouth at bedtime.   clonazePAM  1 MG tablet Commonly known as:  KLONOPIN  Take 1 mg by mouth 2 (two) times daily as needed.   dicyclomine  20 MG tablet Commonly known as: BENTYL  Take 20 mg by mouth 4 (four) times daily as needed for spasms.   hydrOXYzine  25 MG tablet Commonly known as: ATARAX  Take 25 mg by mouth in the morning, at noon, in the evening, and at bedtime.   Lybalvi  10-10 MG Tabs Generic drug: OLANZapine -Samidorphan Take 1 tablet by mouth at bedtime.   pregabalin  200 MG capsule Commonly known as: LYRICA  Take 200 mg by mouth 2 (two) times daily.   promethazine  12.5 MG tablet Commonly known as: PHENERGAN  Take 12.5-25 mg by mouth every 8 (eight) hours as needed.   QUEtiapine  400 MG tablet Commonly known as: SEROQUEL  Take 400 mg by mouth at bedtime.   SUMAtriptan  20 MG/ACT nasal spray Commonly known as: IMITREX  Place 20 mg into the nose every 2 (two) hours as needed for migraine or headache. May repeat in 2 hours if headache persists or recurs.   topiramate  100 MG tablet Commonly known as: TOPAMAX  Take 100 mg by mouth daily as needed (migraine).         Follow ups:    Follow-up Information     Gerome Brunet, DO Follow up.   Specialty: Family Medicine Contact information: [address] [place] [place] [address] [phone]                 Discharge Instructions:   Discharge Instructions     Call MD for:  difficulty breathing, headache or visual disturbances   Complete by: As directed    Call MD for:  extreme fatigue   Complete by: As directed    Call MD for:  hives   Complete by: As directed    Call MD for:  persistant dizziness or light-headedness   Complete by: As directed    Call MD for:  persistant nausea and vomiting   Complete by: As directed    Call MD for:  severe uncontrolled pain   Complete by: As directed    Call MD for:  temperature >100.4   Complete by: As directed    Diet general   Complete by: As directed    Discharge instructions   Complete by: As directed     Recommendations at discharge:   Continue prior neuropsych meds.  Continue to follow with psychiatry as an outpatient.  Stop drug abuse   General discharge instructions: Follow with Primary MD Gerome Brunet, DO in 7 days  Please request your PCP  to go over your hospital tests, procedures, radiology results at the follow up. Please get your medicines reviewed and adjusted.  Your PCP may decide to repeat certain labs or tests as needed. Do not drive, operate heavy machinery, perform activities at heights, swimming or participation in water activities or provide baby sitting services if your were admitted for syncope or siezures until you have seen by Primary MD or a Neurologist and advised to do so again. [place]  Controlled Substance Reporting System database was reviewed. Do not drive, operate heavy machinery, perform activities at heights, swim, participate in water activities or provide  baby-sitting services while on medications for pain, sleep and mood until your outpatient physician has reevaluated you and advised to do so again.  You are strongly recommended to comply with the dose, frequency and duration of prescribed medications. Activity: As tolerated with Full fall precautions use walker/cane & assistance as needed Avoid using any recreational substances like cigarette, tobacco, alcohol, or non-prescribed drug. If you experience worsening of your admission symptoms, develop shortness of breath, life threatening emergency, suicidal or homicidal thoughts you must seek medical attention immediately by calling 911 or calling your MD immediately  if symptoms less severe. You must read complete instructions/literature along with all the possible adverse reactions/side effects for all the medicines you take and that have been prescribed to you. Take any new medicine only after you have completely understood and accepted all the possible adverse reactions/side effects.  Wear Seat belts while  driving. You were cared for by a hospitalist during your hospital stay. If you have any questions about your discharge medications or the care you received while you were in the hospital after you are discharged, you can call the unit and ask to speak with the hospitalist or the covering physician. Once you are discharged, your primary care physician will handle any further medical issues. Please note that NO REFILLS for any discharge medications will be authorized once you are discharged, as it is imperative that you return to your primary care physician (or establish a relationship with a primary care physician if you do not have one).   Increase activity slowly   Complete by: As directed        Discharge Exam:   Vitals:   10/27/24 1724 10/27/24 2126 10/28/24 0750 10/28/24 1133  BP: 106/67 117/64 99/62 110/68  Pulse: (!) 56 63 65 79  Resp: 16  19 16   Temp: (!) 97.5 F (36.4 C) 97.6 F (36.4 C) (!) 97.4 F (36.3 C) 97.6 F (36.4 C)  TempSrc: Oral Oral Oral Oral  SpO2: 97% 98% 97% 98%  Weight:      Height:        Body mass index is 27.99 kg/m.  General exam: Pleasant, middle-aged Caucasian female.  Not in distress Skin: No rashes, lesions or ulcers. HEENT: Atraumatic, normocephalic, no obvious bleeding Lungs: Clear to auscultation bilaterally,  CVS: S1, S2, no murmur,   GI/Abd: Soft, nontender, nondistended, bowel sound present,   CNS: Alert, awake, oriented x 3 Psychiatry: Mood appropriate Extremities: No pedal edema, no calf tenderness,    The results of significant diagnostics from this hospitalization (including imaging, microbiology, ancillary and laboratory) are listed below for reference.    Procedures and Diagnostic Studies:   ECHOCARDIOGRAM COMPLETE Result Date: 10/27/2024    ECHOCARDIOGRAM REPORT   Patient Name:   [name] Date of Exam: 10/27/2024 Medical Rec #:  [id number]         Height:       63.0 in Accession #:    [id number]        Weight:        158.0 lb Date of Birth:  Mar 04, 1966         BSA:          1.749 m Patient Age:    58 years          BP:           128/77 mmHg Patient Gender: F                 HR:  70 bpm. Exam Location:  Inpatient Procedure: 2D Echo, Color Doppler and Cardiac Doppler (Both Spectral and Color            Flow Doppler were utilized during procedure). Indications:    Stroke I63.9  History:        Patient has no prior history of Echocardiogram examinations  Sonographer:    Gwendolynn Blush RDCS Referring Phys: [id number] ROBERT DORRELL IMPRESSIONS  1. Left ventricular ejection fraction, by estimation, is 65 to 70%. The left ventricle has normal function. The left ventricle has no regional wall motion abnormalities. Left ventricular diastolic parameters were normal.  2. Right ventricular systolic function is normal. The right ventricular size is normal.  3. The mitral valve is normal in structure. Trivial mitral valve regurgitation. No evidence of mitral stenosis.  4. The aortic valve is tricuspid. Aortic valve regurgitation is not visualized. No aortic stenosis is present.  5. The inferior vena cava is normal in size with greater than 50% respiratory variability, suggesting right atrial pressure of 3 mmHg. Comparison(s): No prior Echocardiogram. FINDINGS  Left Ventricle: Left ventricular ejection fraction, by estimation, is 65 to 70%. The left ventricle has normal function. The left ventricle has no regional wall motion abnormalities. The left ventricular internal cavity size was normal in size. There is  no left ventricular hypertrophy. Left ventricular diastolic parameters were normal. Right Ventricle: The right ventricular size is normal. No increase in right ventricular wall thickness. Right ventricular systolic function is normal. Left Atrium: Left atrial size was normal in size. Right Atrium: Right atrial size was normal in size. Pericardium: There is no evidence of pericardial effusion. Mitral Valve: The mitral valve is  normal in structure. Trivial mitral valve regurgitation. No evidence of mitral valve stenosis. Tricuspid Valve: The tricuspid valve is normal in structure. Tricuspid valve regurgitation is not demonstrated. No evidence of tricuspid stenosis. Aortic Valve: The aortic valve is tricuspid. Aortic valve regurgitation is not visualized. No aortic stenosis is present. Aortic valve mean gradient measures 4.0 mmHg. Aortic valve peak gradient measures 8.8 mmHg. Aortic valve area, by VTI measures 2.64 cm. Pulmonic Valve: The pulmonic valve was normal in structure. Pulmonic valve regurgitation is not visualized. No evidence of pulmonic stenosis. Aorta: The aortic root and ascending aorta are structurally normal, with no evidence of dilitation. Venous: The inferior vena cava is normal in size with greater than 50% respiratory variability, suggesting right atrial pressure of 3 mmHg. IAS/Shunts: No atrial level shunt detected by color flow Doppler.  LEFT VENTRICLE PLAX 2D LVIDd:         4.70 cm      Diastology LVIDs:         2.90 cm      LV e' medial:    9.90 cm/s LV PW:         0.90 cm      LV E/e' medial:  10.9 LV IVS:        0.70 cm      LV e' lateral:   16.80 cm/s LVOT diam:     2.00 cm      LV E/e' lateral: 6.4 LV SV:         107 LV SV Index:   61 LVOT Area:     3.14 cm  LV Volumes (MOD) LV vol d, MOD A2C: 97.7 ml LV vol d, MOD A4C: 124.0 ml LV vol s, MOD A2C: 35.2 ml LV vol s, MOD A4C: 39.5 ml LV SV MOD A2C:  62.5 ml LV SV MOD A4C:     124.0 ml LV SV MOD BP:      73.3 ml RIGHT VENTRICLE RV S prime:     22.70 cm/s TAPSE (M-mode): 2.7 cm LEFT ATRIUM             Index        RIGHT ATRIUM           Index LA diam:        3.90 cm 2.23 cm/m   RA Area:     10.90 cm LA Vol (A2C):   30.8 ml 17.61 ml/m  RA Volume:   21.60 ml  12.35 ml/m LA Vol (A4C):   40.0 ml 22.87 ml/m LA Biplane Vol: 36.7 ml 20.98 ml/m  AORTIC VALVE AV Area (Vmax):    2.82 cm AV Area (Vmean):   2.77 cm AV Area (VTI):     2.64 cm AV Vmax:            148.00 cm/s AV Vmean:          95.900 cm/s AV VTI:            0.404 m AV Peak Grad:      8.8 mmHg AV Mean Grad:      4.0 mmHg LVOT Vmax:         133.00 cm/s LVOT Vmean:        84.700 cm/s LVOT VTI:          0.340 m LVOT/AV VTI ratio: 0.84  AORTA Ao Root diam: 2.70 cm Ao Asc diam:  2.40 cm MITRAL VALVE MV Area (PHT): 2.68 cm     SHUNTS MV Decel Time: 283 msec     Systemic VTI:  0.34 m MV E velocity: 108.00 cm/s  Systemic Diam: 2.00 cm MV A velocity: 79.70 cm/s MV E/A ratio:  1.36 Stanly Leavens MD Electronically signed by Stanly Leavens MD Signature Date/Time: 10/27/2024/3:11:55 PM    Final    MR BRAIN WO CONTRAST Result Date: 10/27/2024 EXAM: MRI BRAIN WITHOUT CONTRAST 10/27/2024 09:55:00 AM TECHNIQUE: Multiplanar multisequence MRI of the head/brain was performed without the administration of intravenous contrast. COMPARISON: CTA head and neck 10/27/2024, Head CT 10/26/2024. CLINICAL HISTORY: 58 year old female. code stroke  presentation yesterday. FINDINGS: BRAIN AND VENTRICLES: Preserved intracranial arterial flow voids, stable from prior CTA. Largely normal for age gray and white matter signal throughout the brain. Minimal nonspecific cerebral white matter signal changes, significant doubtful. No cortical encephalomalacia or chronic cerebral blood products identified. No acute infarct. No intracranial hemorrhage. No mass. No midline shift. No hydrocephalus. The sella is unremarkable. Stable compared to CTA earlier today. Motion degraded coronal T2 weighted imaging. ORBITS: No acute abnormality. SINUSES AND MASTOIDS: Paranasal sinuses and mastoids are well aerated. BONES AND SOFT TISSUES: Normal marrow signal. No acute soft tissue abnormality. Negative visible cervical spine. IMPRESSION: 1. No acute intracranial abnormality. 2. Negative for age non contrast MRI appearance of the brain. Electronically signed by: Helayne Hurst MD 10/27/2024 10:27 AM EST RP Workstation: [id number]   CT ANGIO HEAD  NECK W WO CM (CODE STROKE) Result Date: 10/27/2024 EXAM: CTA HEAD AND NECK WITHOUT AND WITH 10/27/2024 07:39:56 AM TECHNIQUE: CTA of the head and neck was performed without and with the administration of 75 mL of iohexol  (OMNIPAQUE ) 350 MG/ML injection. Multiplanar 2D and/or 3D reformatted images are provided for review. Automated exposure control, iterative reconstruction, and/or weight based adjustment of the mA/kV was utilized to reduce the radiation dose  to as low as reasonably achievable. Stenosis of the internal carotid arteries measured using NASCET criteria. COMPARISON: CT head 10/26/2024. CLINICAL HISTORY: 58 year old female with acute neuro deficit, stroke suspected. FINDINGS: CTA NECK: AORTIC ARCH AND ARCH VESSELS: Tortuous aortic arch with mild atherosclerosis. 3 vessel arch configuration. No dissection or arterial injury. No significant stenosis of the brachiocephalic or subclavian arteries. Proximal right subclavian artery and right vertebral artery origin are normal. Proximal left subclavian artery and left vertebral artery origin are normal. CERVICAL CAROTID ARTERIES: Tortuous proximal right CCA. Mildly tortuous cervical right ICA. Tortuous right ICA siphon. Normal right ophthalmic artery origin. Minimal right carotid atherosclerosis and no right carotid stenosis. Mildly tortuous left CCA. Mild calcified plaque at the left ICA origin without stenosis. Mildly tortuous cervical left ICA and left ICA siphon. No left carotid stenosis. No dissection or arterial injury. CERVICAL VERTEBRAL ARTERIES: Dominant left vertebral artery without stenosis. Nondominant right vertebral artery is diminutive, functionally terminates and PICA, normal variant. No dissection or arterial injury. LUNGS AND MEDIASTINUM: Negative visible upper chest. SOFT TISSUES: Neck soft tissue spaces appear normal. BONES: The dentition is absent. Cervical ACDF at C5-C6 with solid arthrodesis. No acute abnormality. CTA HEAD: ANTERIOR  CIRCULATION: No significant stenosis of the internal carotid arteries. MCA and ACA origins are normal. Dominant left ACA A1 segment (normal variant). Tortuous A1 segments. Normal anterior communicating artery. No aneurysm. POSTERIOR CIRCULATION: Left vertebral artery primarily supplies the basilar artery. Normal SCA and PCA origins. Tortuous right P1 segment. Posterior communicating arteries are diminutive or absent. No aneurysm. OTHER: Major dural venous sinuses are enhancing and appear patent. IMPRESSION: 1. No large vessel occlusion or significant stenosis. Mild for age atherosclerosis. Generalized arterial tortuosity. Electronically signed by: Helayne Hurst MD 10/27/2024 07:53 AM EST RP Workstation: [id number]   CT HEAD CODE STROKE WO CONTRAST Result Date: 10/26/2024 EXAM: CT HEAD WITHOUT CONTRAST 10/26/2024 07:57:31 PM TECHNIQUE: CT of the head was performed without the administration of intravenous contrast. Automated exposure control, iterative reconstruction, and/or weight based adjustment of the mA/kV was utilized to reduce the radiation dose to as low as reasonably achievable. COMPARISON: CT head 06/19/2014. CLINICAL HISTORY: Neuro deficit, acute, stroke suspected. FINDINGS: BRAIN AND VENTRICLES: No acute hemorrhage. No evidence of acute infarct. No hydrocephalus. No extra-axial collection. No mass effect or midline shift. 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 [place] Stroke Program Early CT (ASPECT) score: Ganglionic (caudate, internal capsule, lentiform nucleus, insula, M1-M3): 7 Supraganglionic (M4-M6): 3 Total: 10 ORBITS: No acute abnormality. SINUSES: No acute abnormality. SOFT TISSUES AND SKULL: No acute soft tissue abnormality. No skull fracture. IMPRESSION: 1. No acute intracranial abnormality. 2. ASPECTS score is 10. 3. Possible cerebellar tonsil ectopia, greater on the right, although the foramen magnum is incompletely  visualized. 4. Impression #1 and #2 messaged to Dr. Lindzen at 8:05 PM on 10/26/24. Electronically signed by: Donnice Mania MD 10/26/2024 08:06 PM EST RP Workstation: [id number]     Labs:   Basic Metabolic Panel: Recent Labs  Lab 10/26/24 1950 10/26/24 1956  NA 141 143  K 4.2 4.1  CL 107 107  CO2 24  --   GLUCOSE 96 95  BUN 12 12  CREATININE 0.65 0.70  CALCIUM  9.4  --    GFR Estimated Creatinine Clearance: 73.6 mL/min (by C-G formula based on SCr of 0.7 mg/dL). Liver Function Tests: Recent Labs  Lab 10/26/24 1950  AST 22  ALT 26  ALKPHOS 28*  BILITOT <0.2  PROT 6.9  ALBUMIN 4.4   Recent Labs  Lab 10/26/24 1950  LIPASE 41   No results for input(s): AMMONIA in the last 168 hours. Coagulation profile Recent Labs  Lab 10/26/24 1950  INR 0.9    CBC: Recent Labs  Lab 10/26/24 1950 10/26/24 1956  WBC 6.1  --   NEUTROABS 3.5  --   HGB 13.4 13.3  HCT 39.1 39.0  MCV 90.1  --   PLT 318  --    Cardiac Enzymes: No results for input(s): CKTOTAL, CKMB, CKMBINDEX, TROPONINI in the last 168 hours. BNP: Invalid input(s): POCBNP CBG: No results for input(s): GLUCAP in the last 168 hours. D-Dimer No results for input(s): DDIMER in the last 72 hours. Hgb A1c Recent Labs    10/27/24 0602  HGBA1C 5.6   Lipid Profile Recent Labs    10/28/24 0617  CHOL 224*  HDL 49  LDLCALC 161*  TRIG 70  CHOLHDL 4.5   Thyroid  function studies No results for input(s): TSH, T4TOTAL, T3FREE, THYROIDAB in the last 72 hours.  Invalid input(s): FREET3 Anemia work up No results for input(s): VITAMINB12, FOLATE, FERRITIN, TIBC, IRON, RETICCTPCT in the last 72 hours. Microbiology No results found for this or any previous visit (from the past 240 hours).  Time coordinating discharge: 45 minutes  Signed: Nesta Kimple  [facility] 10/28/2024, 2:49 PM  "

## 2024-10-29 ENCOUNTER — Telehealth: Payer: Self-pay

## 2024-10-29 NOTE — Transitions of Care (Post Inpatient/ED Visit) (Signed)
 "  10/29/2024  Name: [name] MRN: [id number] DOB: 04-01-1966  Today's TOC FU Call Status: Today's TOC FU Call Status:: Successful TOC FU Call Completed TOC FU Call Complete Date: 10/29/24  Patient's Name and Date of Birth confirmed. Name, DOB  Transition Care Management Follow-up Telephone Call Date of Discharge: 10/28/24 Discharge Facility: [facility]) Type of Discharge: Inpatient Admission Primary Inpatient Discharge Diagnosis:: Migrine Flare up How have you been since you were released from the hospital?: Better Any questions or concerns?: No  Items Reviewed: Did you receive and understand the discharge instructions provided?: Yes Medications obtained,verified, and reconciled?: Yes (Medications Reviewed) Dietary orders reviewed?: Yes Type of Diet Ordered:: Heart Healthy Do you have support at home?: Yes People in Home [RPT]: significant other Name of Support/Comfort Primary Source: Fairy  Medications Reviewed Today: Medications Reviewed Today     Reviewed by Harleyquinn Gasser, RN (Case Manager) on 10/29/24 at 1245  Med List Status: <None>   Medication Order Taking? Sig Documenting Provider Last Dose Status Informant  albuterol  (VENTOLIN  HFA) 108 (90 Base) MCG/ACT inhaler [id number] Yes Inhale 2 puffs into the lungs 2 (two) times daily. [provider]  Active Self, Pharmacy Records  atorvastatin  (LIPITOR) 20 MG tablet [id number] Yes Take 20 mg by mouth at bedtime. [provider]  Active Self, Pharmacy Records  budesonide -glycopyrrolate -formoterol  (BREZTRI) 160-9-4.8 MCG/ACT AERO inhaler [id number] Yes Inhale 2 puffs into the lungs 2 (two) times daily as needed. [provider]  Active Self, Pharmacy Records  buPROPion  (WELLBUTRIN  XL) 300 MG 24 hr tablet [id number] Yes Take 300 mg by mouth at bedtime. [provider]  Active Self, Pharmacy Records  carisoprodol  (SOMA ) 350 MG tablet [id number] Yes Take 350 mg by mouth at bedtime.  [provider]  Active Self, Pharmacy Records  clonazePAM  (KLONOPIN ) 1 MG tablet [id number] Yes Take 1 mg by mouth 2 (two) times daily as needed. [provider]  Active Self, Pharmacy Records  dicyclomine  (BENTYL ) 20 MG tablet [id number] Yes Take 20 mg by mouth 4 (four) times daily as needed for spasms. [provider]  Active Self, Pharmacy Records  hydrOXYzine  (ATARAX ) 25 MG tablet [id number] Yes Take 25 mg by mouth in the morning, at noon, in the evening, and at bedtime. [provider]  Active Self, Pharmacy Records  LYBALVI  10-10 MG TABS [id number] Yes Take 1 tablet by mouth at bedtime. [provider]  Active Self, Pharmacy Records  pregabalin  (LYRICA ) 200 MG capsule [id number] Yes Take 200 mg by mouth 2 (two) times daily. [provider]  Active Self, Pharmacy Records  promethazine  (PHENERGAN ) 12.5 MG tablet [id number] Yes Take 12.5-25 mg by mouth every 8 (eight) hours as needed. [provider]  Active Self, Pharmacy Records  QUEtiapine  (SEROQUEL ) 400 MG tablet [id number] Yes Take 400 mg by mouth at bedtime. [provider]  Active Self, Pharmacy Records  SUMAtriptan  (IMITREX ) 20 MG/ACT nasal spray [id number] Yes Place 20 mg into the nose every 2 (two) hours as needed for migraine or headache. May repeat in 2 hours if headache persists or recurs. [provider]  Active Self, Pharmacy Records  topiramate  (TOPAMAX ) 100 MG tablet [id number] Yes Take 100 mg by mouth daily as needed (migraine). [provider]  Active Self, Pharmacy Records            Home Care and Equipment/Supplies: Were Home Health Services Ordered?: NA Any new equipment or medical supplies ordered?: Yes Name of Medical supply agency?: [employer] (Rolling walker delivered prior  to discharge) Were you able to get the equipment/medical supplies?: Yes Do you have any questions related to the use of the equipment/supplies?: No  Functional  Questionnaire: Do you need assistance with bathing/showering or dressing?: No Do you need assistance with meal preparation?: No Do you need assistance with eating?: No Do you have difficulty maintaining continence: No Do you need assistance with getting out of bed/getting out of a chair/moving?: No Do you have difficulty managing or taking your medications?: No  Follow up appointments reviewed: PCP Follow-up appointment confirmed?: No (Patient states she will call today) MD Provider Line Number:[phone] Given: No Specialist Hospital Follow-up appointment confirmed?: NA Do you need transportation to your follow-up appointment?: No Do you understand care options if your condition(s) worsen?: Yes-patient verbalized understanding  SDOH Interventions Today    Flowsheet Row Most Recent Value  SDOH Interventions   Food Insecurity Interventions Intervention Not Indicated  Housing Interventions Intervention Not Indicated  Transportation Interventions Intervention Not Indicated  Utilities Interventions Intervention Not Indicated    Carliyah Cotterman J. Keasia Dubose RN, MSN [facility]  [facility], [facility] RN Care Manager Direct Dial: [phone]  Fax: [phone] Website: [email or web address]   "

## 2024-10-29 NOTE — Patient Instructions (Signed)
 Visit Information  Thank you for taking time to visit with me today. Please don't hesitate to contact me if I can be of assistance to you before our next scheduled telephone appointment.  /Our next appointment is by telephone on 11/07/24 at 1130 am  Following is a copy of your care plan:   Goals Addressed             This Visit's Progress    VBCI Transitions of Care (TOC) Care Plan       Problems:  Recent Hospitalization for treatment of Migraine Knowledge Deficit Related to Migraine and No Hospital Follow Up Provider appointment patient states she will call PCP today  Goal:  Over the next 30 days, the patient will not experience hospital readmission  Interventions:  Transitions of Care: Doctor Visits  - discussed the importance of doctor visits Communication with PCP re: [facility] program enrollment Seek medical attention for severe headache, change in vision, one sided weakness, confusion. Advised on importance of stopping drug use.     Patient Self Care Activities:  Attend all scheduled provider appointments Call provider office for new concerns or questions  Notify RN Care Manager of TOC call rescheduling needs Participate in Transition of Care Program/Attend TOC scheduled calls Perform IADL's (shopping, preparing meals, housekeeping, managing finances) independently Take medications as prescribed   Seek emergency care immediately for severe headaches, neurological problems such as weakness to one side, confusion, trouble seeing, fever with neck stiffness. Stop Drug Abuse  Plan:  The patient has been provided with contact information for the care management team and has been advised to call with any health related questions or concerns.         Patient verbalizes understanding of instructions and care plan provided today and agrees to view in MyChart. Active MyChart status and patient understanding of how to access instructions and care plan via MyChart confirmed with  patient.     The patient has been provided with contact information for the care management team and has been advised to call with any health related questions or concerns.   Please call the care guide team at [phone] if you need to cancel or reschedule your appointment.   Please call the Suicide and Crisis Lifeline: 988 if you are experiencing a Mental Health or Behavioral Health Crisis or need someone to talk to.  Darshana Curnutt J. Jazon Jipson RN, MSN [facility], [facility] RN Care Manager Direct Dial: [phone]  Fax: [phone] Website: [email or web address]

## 2024-11-07 ENCOUNTER — Telehealth: Payer: Self-pay

## 2024-11-09 ENCOUNTER — Telehealth: Payer: Self-pay

## 2024-11-09 NOTE — Transitions of Care (Post Inpatient/ED Visit) (Signed)
 " Transition of Care week 2  Visit Note  11/09/2024  Name: [name] MRN: [id number]          DOB: Dec 18, 1965  Situation: Patient enrolled in [facility] 30-day program. Visit completed with patient by telephone.   Background:   Initial Transition Care Management Follow-up Telephone Call Discharge Date and Diagnosis: 10/28/24, Migrine Flare up   Past Medical History:  Diagnosis Date   Abscess    rectal   Anxiety    Arthritis    back; hips (11/14/2014)   Bipolar disorder (HCC)    Chronic back pain    all over   Chronic bronchitis (HCC)    I get it q yr (11/14/2014)   COPD (chronic obstructive pulmonary disease) (HCC)    Depression    Dyspnea    W/ EXERTION    Emphysema lung (HCC)    Endometriosis    Fibromyalgia    History of kidney stones    ILD (interstitial lung disease) (HCC) 11/2013   Migraine    3 in the last 2 wks (11/14/2014)   Ovarian cyst    Pulmonary infiltrates     Assessment: Patient Reported Symptoms: Cognitive Cognitive Status: Alert and oriented to person, place, and time, Normal speech and language skills      Neurological [place] Neurological Symptoms/Conditions [RPT]: Hx of migraines Neurological Management Strategies: Medication therapy Neurological Self-Management Outcome: 4 (good)  HEENT HEENT Symptoms Reported: No symptoms reported      Cardiovascular Cardiovascular Symptoms Reported: No symptoms reported    Respiratory Respiratory Symptoms Reported: No symptoms reported    Endocrine Endocrine Symptoms Reported: No symptoms reported    Gastrointestinal Gastrointestinal Symptoms Reported: Abdominal pain or discomfort Additional Gastrointestinal Details: Intermittent abdominal pain.  Upper GI on 2-4.  Has Bentyl  for PRN use.  Advised to use Bentyl  and return to ED if pain is severe. Gastrointestinal Management Strategies: Medication therapy Gastrointestinal Self-Management Outcome: 3 (uncertain)    Genitourinary Genitourinary Symptoms  Reported: No symptoms reported    Integumentary Integumentary Symptoms Reported: No symptoms reported    Musculoskeletal Musculoskelatal Symptoms Reviewed: Weakness Additional Musculoskeletal Details: Using walker Musculoskeletal Management Strategies: Routine screening, Exercise Musculoskeletal Self-Management Outcome: 4 (good) Falls in the past year?: No    Psychosocial Psychosocial Symptoms Reported: No symptoms reported         There were no vitals filed for this visit. Pain Scale: 0-10 Pain Score: 0-No pain  Medications Reviewed Today     Reviewed by Kline Bulthuis, RN (Case Manager) on 11/09/24 at 1216  Med List Status: <None>   Medication Order Taking? Sig Documenting Provider Last Dose Status Informant  albuterol  (VENTOLIN  HFA) 108 (90 Base) MCG/ACT inhaler [id number] Yes Inhale 2 puffs into the lungs 2 (two) times daily. [provider]  Active Self, Pharmacy Records  atorvastatin  (LIPITOR) 20 MG tablet [id number] Yes Take 20 mg by mouth at bedtime. [provider]  Active Self, Pharmacy Records  budesonide -glycopyrrolate -formoterol  (BREZTRI) 160-9-4.8 MCG/ACT AERO inhaler [id number] Yes Inhale 2 puffs into the lungs 2 (two) times daily as needed. [provider]  Active Self, Pharmacy Records  buPROPion  (WELLBUTRIN  XL) 300 MG 24 hr tablet [id number] Yes Take 300 mg by mouth at bedtime. [provider]  Active Self, Pharmacy Records  carisoprodol  (SOMA ) 350 MG tablet [id number] Yes Take 350 mg by mouth at bedtime. [provider]  Active Self, Pharmacy Records  clonazePAM  (KLONOPIN ) 1 MG tablet [id number] Yes Take 1 mg by mouth 2 (two) times daily as  needed. [provider]  Active Self, Pharmacy Records  dicyclomine  (BENTYL ) 20 MG tablet [id number] Yes Take 20 mg by mouth 4 (four) times daily as needed for spasms. [provider]  Active Self, Pharmacy Records  hydrOXYzine  (ATARAX ) 25 MG tablet [id number] Yes Take 25 mg by  mouth in the morning, at noon, in the evening, and at bedtime. [provider]  Active Self, Pharmacy Records  LYBALVI  10-10 MG TABS [id number] Yes Take 1 tablet by mouth at bedtime. [provider]  Active Self, Pharmacy Records  pregabalin  (LYRICA ) 200 MG capsule [id number] Yes Take 200 mg by mouth 2 (two) times daily. [provider]  Active Self, Pharmacy Records  promethazine  (PHENERGAN ) 12.5 MG tablet [id number] Yes Take 12.5-25 mg by mouth every 8 (eight) hours as needed. [provider]  Active Self, Pharmacy Records  QUEtiapine  (SEROQUEL ) 400 MG tablet [id number] Yes Take 400 mg by mouth at bedtime. [provider]  Active Self, Pharmacy Records  SUMAtriptan  (IMITREX ) 20 MG/ACT nasal spray [id number] Yes Place 20 mg into the nose every 2 (two) hours as needed for migraine or headache. May repeat in 2 hours if headache persists or recurs. [provider]  Active Self, Pharmacy Records  topiramate  (TOPAMAX ) 100 MG tablet [id number] Yes Take 100 mg by mouth daily as needed (migraine). [provider]  Active Self, Pharmacy Records            Goals      VBCI Transitions of Care [facility]) Care Plan     Problems:  Recent Hospitalization for treatment of Migraine Knowledge Deficit Related to Migraine  Goal:  Over the next 30 days, the patient will not experience hospital readmission  Interventions:  Transitions of Care: 11/09/24 Patient states she is having some stomach issues such as pain and feeling full.  Upper GI scheduled for 2-4.  She reports she has some bentyl  ordered from GI.  Advised that she take that.  She verbalized understanding.  Discussed fluid intake and supplements if she cannot eat.  Reports no headache at this time.  Seen by PCP this week for hospital follow up.  No changes at this time.      Doctor Visits  - discussed the importance of doctor visits Seek medical attention for severe headache, change in vision, one  sided weakness, confusion. Discussed nutritional intake.   Advised on if severe abdominal pain to return to the ED.    Patient Self Care Activities:  Attend all scheduled provider appointments Call provider office for new concerns or questions  Notify RN Care Manager of TOC call rescheduling needs Participate in Transition of Care Program/Attend TOC scheduled calls Perform IADL's (shopping, preparing meals, housekeeping, managing finances) independently Take medications as prescribed   Seek emergency care immediately for severe headaches, neurological problems such as weakness to one side, confusion, trouble seeing, fever with neck stiffness. Stop Drug Abuse  Plan:  The patient has been provided with contact information for the care management team and has been advised to call with any health related questions or concerns.         Recommendation:   Continue Current Plan of Care  Follow Up Plan:   Telephone follow-up in 1 week  Sharmayne Jablon J. Stacye Noori RN, MSN [facility], [facility] RN Care Manager Direct Dial: [phone]  Fax: [phone] Website: [email or web address]      "

## 2024-11-09 NOTE — Patient Instructions (Signed)
 Visit Information  Thank you for taking time to visit with me today. Please don't hesitate to contact me if I can be of assistance to you before our next scheduled telephone appointment.  Our next appointment is by telephone on 11/16/24 at 1100 am  Following is a copy of your care plan:   Goals Addressed             This Visit's Progress    VBCI Transitions of Care (TOC) Care Plan       Problems:  Recent Hospitalization for treatment of Migraine Knowledge Deficit Related to Migraine  Goal:  Over the next 30 days, the patient will not experience hospital readmission  Interventions:  Transitions of Care: 11/09/24 Patient states she is having some stomach issues such as pain and feeling full.  Upper GI scheduled for 2-4.  She reports she has some bentyl  ordered from GI.  Advised that she take that.  She verbalized understanding.  Discussed fluid intake and supplements if she cannot eat.  Reports no headache at this time.  Seen by PCP this week for hospital follow up.  No changes at this time.      Doctor Visits  - discussed the importance of doctor visits Seek medical attention for severe headache, change in vision, one sided weakness, confusion. Discussed nutritional intake.   Advised on if severe abdominal pain to return to the ED.    Patient Self Care Activities:  Attend all scheduled provider appointments Call provider office for new concerns or questions  Notify RN Care Manager of TOC call rescheduling needs Participate in Transition of Care Program/Attend TOC scheduled calls Perform IADL's (shopping, preparing meals, housekeeping, managing finances) independently Take medications as prescribed   Seek emergency care immediately for severe headaches, neurological problems such as weakness to one side, confusion, trouble seeing, fever with neck stiffness. Stop Drug Abuse  Plan:  The patient has been provided with contact information for the care management team and has been  advised to call with any health related questions or concerns.         Patient verbalizes understanding of instructions and care plan provided today and agrees to view in MyChart. Active MyChart status and patient understanding of how to access instructions and care plan via MyChart confirmed with patient.     The patient has been provided with contact information for the care management team and has been advised to call with any health related questions or concerns.   Please call the care guide team at [phone] if you need to cancel or reschedule your appointment.   Please call the Suicide and Crisis Lifeline: 988 if you are experiencing a Mental Health or Behavioral Health Crisis or need someone to talk to.  Autym Siess J. Zoha Spranger RN, MSN [facility], [facility] RN Care Manager Direct Dial: [phone]  Fax: [phone] Website: [email or web address]

## 2024-11-16 ENCOUNTER — Telehealth: Payer: Self-pay

## 2024-11-20 ENCOUNTER — Telehealth: Payer: Self-pay

## 2024-11-20 NOTE — Transitions of Care (Post Inpatient/ED Visit) (Signed)
 " Transition of Care week 3  Visit Note  11/20/2024  Name: [name] MRN: [id number]          DOB: 1965-12-05  Situation: Patient enrolled in [facility] 30-day program. Visit completed with patient by telephone.   Background:   Initial Transition Care Management Follow-up Telephone Call Discharge Date and Diagnosis: 10/28/24, Migrine Flare up   Past Medical History:  Diagnosis Date   Abscess    rectal   Anxiety    Arthritis    back; hips (11/14/2014)   Bipolar disorder (HCC)    Chronic back pain    all over   Chronic bronchitis (HCC)    I get it q yr (11/14/2014)   COPD (chronic obstructive pulmonary disease) (HCC)    Depression    Dyspnea    W/ EXERTION    Emphysema lung (HCC)    Endometriosis    Fibromyalgia    History of kidney stones    ILD (interstitial lung disease) (HCC) 11/2013   Migraine    3 in the last 2 wks (11/14/2014)   Ovarian cyst    Pulmonary infiltrates     Assessment: Patient Reported Symptoms: Cognitive Cognitive Status: Alert and oriented to person, place, and time, Normal speech and language skills      Neurological Neurological Review of Symptoms: Other: [place] Neurological Symptoms/Conditions [RPT]: Hx Migraines Neurological Management Strategies: Medication therapy Neurological Self-Management Outcome: 4 (good) Neurological Comment: Seek medical attention for severe headache, change in vision, one sided weakness, confusion  HEENT HEENT Symptoms Reported: No symptoms reported      Cardiovascular Cardiovascular Symptoms Reported: No symptoms reported    Respiratory Respiratory Symptoms Reported: No symptoms reported    Endocrine Endocrine Symptoms Reported: No symptoms reported    Gastrointestinal Gastrointestinal Symptoms Reported: Abdominal pain or discomfort Additional Gastrointestinal Details: Intermittent abdominal pain. Upper GI on 2-4. Has Bentyl  for PRN use. Advised to use Bentyl  and return to ED if pain is  severe. Gastrointestinal Management Strategies: Medication therapy Gastrointestinal Self-Management Outcome: 3 (uncertain)    Genitourinary Genitourinary Symptoms Reported: No symptoms reported    Integumentary Integumentary Symptoms Reported: No symptoms reported    Musculoskeletal Musculoskelatal Symptoms Reviewed: Weakness Additional Musculoskeletal Details: Using walker Musculoskeletal Management Strategies: Routine screening, Exercise      Psychosocial Psychosocial Symptoms Reported: No symptoms reported         There were no vitals filed for this visit. Pain Scale: 0-10 Pain Score: 8  Pain Type: Chronic pain Pain Location: Abdomen Pain Orientation: Upper Pain Descriptors / Indicators: Aching, Stabbing, Dull Pain Onset: On-going Patients Stated Pain Goal: 0 Pain Intervention(s): Medication (See eMAR)  Medications Reviewed Today     Reviewed by Trany Chernick, RN (Case Manager) on 11/20/24 at 1215  Med List Status: <None>   Medication Order Taking? Sig Documenting Provider Last Dose Status Informant  albuterol  (VENTOLIN  HFA) 108 (90 Base) MCG/ACT inhaler [id number] Yes Inhale 2 puffs into the lungs 2 (two) times daily. [provider]  Active Self, Pharmacy Records  atorvastatin  (LIPITOR) 20 MG tablet [id number] Yes Take 20 mg by mouth at bedtime. [provider]  Active Self, Pharmacy Records  budesonide -glycopyrrolate -formoterol  (BREZTRI) 160-9-4.8 MCG/ACT AERO inhaler [id number] Yes Inhale 2 puffs into the lungs 2 (two) times daily as needed. [provider]  Active Self, Pharmacy Records  buPROPion  (WELLBUTRIN  XL) 300 MG 24 hr tablet [id number] Yes Take 300 mg by mouth at bedtime. [provider]  Active Self, Pharmacy Records  carisoprodol  (SOMA ) 350  MG tablet [id number] Yes Take 350 mg by mouth at bedtime. [provider]  Active Self, Pharmacy Records  clonazePAM  (KLONOPIN ) 1 MG tablet [id number] Yes Take 1 mg by mouth 2 (two)  times daily as needed. [provider]  Active Self, Pharmacy Records  dicyclomine  (BENTYL ) 20 MG tablet [id number] Yes Take 20 mg by mouth 4 (four) times daily as needed for spasms. [provider]  Active Self, Pharmacy Records  hydrOXYzine  (ATARAX ) 25 MG tablet [id number] Yes Take 25 mg by mouth in the morning, at noon, in the evening, and at bedtime. [provider]  Active Self, Pharmacy Records  LYBALVI  10-10 MG TABS [id number] Yes Take 1 tablet by mouth at bedtime. [provider]  Active Self, Pharmacy Records  pregabalin  (LYRICA ) 200 MG capsule [id number] Yes Take 200 mg by mouth 2 (two) times daily. [provider]  Active Self, Pharmacy Records  promethazine  (PHENERGAN ) 12.5 MG tablet [id number] Yes Take 12.5-25 mg by mouth every 8 (eight) hours as needed. [provider]  Active Self, Pharmacy Records  QUEtiapine  (SEROQUEL ) 400 MG tablet [id number] Yes Take 400 mg by mouth at bedtime. [provider]  Active Self, Pharmacy Records  SUMAtriptan  (IMITREX ) 20 MG/ACT nasal spray [id number] Yes Place 20 mg into the nose every 2 (two) hours as needed for migraine or headache. May repeat in 2 hours if headache persists or recurs. [provider]  Active Self, Pharmacy Records  topiramate  (TOPAMAX ) 100 MG tablet [id number] Yes Take 100 mg by mouth daily as needed (migraine). [provider]  Active Self, Pharmacy Records            Goals Addressed             This Visit's Progress    VBCI Transitions of Care (TOC) Care Plan       Problems:  Recent Hospitalization for treatment of Migraine Knowledge Deficit Related to Migraine  Goal:  Over the next 30 days, the patient will not experience hospital readmission  Interventions:  Transitions of Care: 11/20/24 Patient states she is still having some stomach issues such as pain and feeling full.  She also reports some stomach swelling which she states is new.  She  states last BM on yesterday.  Upper GI scheduled for 2-4.  Discussed with patient notifying GI of new stomach swelling.  She states she will call them.  She reports taking bentyl  this AM ordered from GI.  Reiterated  fluid intake and supplements if she cannot eat.  Reports no headache at this time.     Doctor Visits  - discussed the importance of doctor visits Seek medical attention for severe headache, change in vision, one sided weakness, confusion. Reviewed nutritional intake.   Advised on if severe abdominal pain to return to the ED.    Patient Self Care Activities:  Attend all scheduled provider appointments Call provider office for new concerns or questions  Notify RN Care Manager of TOC call rescheduling needs Participate in Transition of Care Program/Attend TOC scheduled calls Perform IADL's (shopping, preparing meals, housekeeping, managing finances) independently Take medications as prescribed   Seek emergency care immediately for severe headaches, neurological problems such as weakness to one side, confusion, trouble seeing, fever with neck stiffness. Stop Drug Abuse  Plan:  The patient has been provided with contact information for the care management team and has been advised to call with any health related questions or concerns.         Recommendation:  Continue Current Plan of Care Patient to call GI to reports stomach swelling  Follow Up Plan:   Telephone follow-up in 1 week  Vasiliy Mccarry J. Tonya Carlile RN, MSN [facility], [facility] RN Care Manager Direct Dial: [phone]  Fax: [phone] Website: [email or web address]      "

## 2024-11-20 NOTE — Patient Instructions (Signed)
 Visit Information  Thank you for taking time to visit with me today. Please don't hesitate to contact me if I can be of assistance to you before our next scheduled telephone appointment.  Our next appointment is by telephone on 11/27/24 at 1230 pm  Following is a copy of your care plan:   Goals Addressed             This Visit's Progress    VBCI Transitions of Care (TOC) Care Plan       Problems:  Recent Hospitalization for treatment of Migraine Knowledge Deficit Related to Migraine  Goal:  Over the next 30 days, the patient will not experience hospital readmission  Interventions:  Transitions of Care: 11/20/24 Patient states she is still having some stomach issues such as pain and feeling full.  She also reports some stomach swelling which she states is new.  She states last BM on yesterday.  Upper GI scheduled for 2-4.  Discussed with patient notifying GI of new stomach swelling.  She states she will call them.  She reports taking bentyl  this AM ordered from GI.  Reiterated  fluid intake and supplements if she cannot eat.  Reports no headache at this time.     Doctor Visits  - discussed the importance of doctor visits Seek medical attention for severe headache, change in vision, one sided weakness, confusion. Reviewed nutritional intake.   Advised on if severe abdominal pain to return to the ED.    Patient Self Care Activities:  Attend all scheduled provider appointments Call provider office for new concerns or questions  Notify RN Care Manager of TOC call rescheduling needs Participate in Transition of Care Program/Attend TOC scheduled calls Perform IADL's (shopping, preparing meals, housekeeping, managing finances) independently Take medications as prescribed   Seek emergency care immediately for severe headaches, neurological problems such as weakness to one side, confusion, trouble seeing, fever with neck stiffness. Stop Drug Abuse  Plan:  The patient has been provided  with contact information for the care management team and has been advised to call with any health related questions or concerns.         Patient verbalizes understanding of instructions and care plan provided today and agrees to view in MyChart. Active MyChart status and patient understanding of how to access instructions and care plan via MyChart confirmed with patient.     The patient has been provided with contact information for the care management team and has been advised to call with any health related questions or concerns.   Please call the care guide team at [phone] if you need to cancel or reschedule your appointment.   Please call the Suicide and Crisis Lifeline: 988 if you are experiencing a Mental Health or Behavioral Health Crisis or need someone to talk to.  Lalena Salas J. Glenwood Revoir RN, MSN [facility], [facility] RN Care Manager Direct Dial: [phone]  Fax: [phone] Website: [email or web address]

## 2024-11-21 ENCOUNTER — Encounter (HOSPITAL_COMMUNITY): Payer: Self-pay | Admitting: Gastroenterology

## 2024-11-27 ENCOUNTER — Telehealth: Payer: Self-pay

## 2024-11-29 ENCOUNTER — Telehealth: Payer: Self-pay

## 2024-12-04 NOTE — Anesthesia Preprocedure Evaluation (Signed)
"                                    Anesthesia Evaluation  Patient identified by MRN, date of birth, ID band Patient awake    Reviewed: Allergy & Precautions, NPO status , Patient's Chart, lab work & pertinent test results  History of Anesthesia Complications Negative for: history of anesthetic complications  Airway Mallampati: II  TM Distance: >3 FB Neck ROM: Full    Dental  (+) Edentulous Upper, Edentulous Lower   Pulmonary COPD,  COPD inhaler, Current Smoker and Patient abstained from smoking.   Pulmonary exam normal        Cardiovascular negative cardio ROS Normal cardiovascular exam   '25 TTE - EF 65 to 70%. Trivial mitral valve regurgitation.     Neuro/Psych  Headaches PSYCHIATRIC DISORDERS Anxiety Depression Bipolar Disorder      GI/Hepatic negative GI ROS,,,(+)     substance abuse  cocaine use  Endo/Other  negative endocrine ROS    Renal/GU negative Renal ROS     Musculoskeletal  (+) Arthritis ,  Fibromyalgia -  Abdominal   Peds  Hematology negative hematology ROS (+)   Anesthesia Other Findings   Reproductive/Obstetrics                              Anesthesia Physical Anesthesia Plan  ASA: 3  Anesthesia Plan: MAC   Post-op Pain Management: Minimal or no pain anticipated   Induction:   PONV Risk Score and Plan: 1 and Propofol  infusion and Treatment may vary due to age or medical condition  Airway Management Planned: Nasal Cannula and Natural Airway  Additional Equipment: None  Intra-op Plan:   Post-operative Plan:   Informed Consent: I have reviewed the patients History and Physical, chart, labs and discussed the procedure including the risks, benefits and alternatives for the proposed anesthesia with the patient or authorized representative who has indicated his/her understanding and acceptance.       Plan Discussed with: CRNA and Anesthesiologist  Anesthesia Plan Comments:           Anesthesia Quick Evaluation  "

## 2024-12-05 ENCOUNTER — Encounter (HOSPITAL_COMMUNITY): Admitting: Anesthesiology

## 2024-12-05 ENCOUNTER — Encounter (HOSPITAL_COMMUNITY)
Admission: RE | Disposition: A | Discharge status: Home / Self Care | Payer: Self-pay | Source: Home / Self Care | Attending: Gastroenterology

## 2024-12-05 ENCOUNTER — Other Ambulatory Visit: Payer: Self-pay

## 2024-12-05 ENCOUNTER — Ambulatory Visit (HOSPITAL_COMMUNITY)
Admission: RE | Admit: 2024-12-05 | Discharge: 2024-12-05 | Disposition: A | Discharge status: Home / Self Care | Source: Home / Self Care | Attending: Gastroenterology | Admitting: Gastroenterology

## 2024-12-05 ENCOUNTER — Encounter (HOSPITAL_COMMUNITY): Payer: Self-pay | Admitting: Gastroenterology

## 2024-12-05 MED ORDER — SODIUM CHLORIDE 0.9 % IV SOLN: INTRAVENOUS | Status: DC

## 2024-12-05 MED ORDER — LIDOCAINE HCL 1 % IJ SOLN
INTRAMUSCULAR | Status: DC | PRN
  Administered 2024-12-05: 40 mg via INTRADERMAL
  Administered 2024-12-05: 1 mg via INTRADERMAL
  Administered 2024-12-05: 10 mg via INTRADERMAL

## 2024-12-05 MED ORDER — PROPOFOL 500 MG/50ML IV EMUL
INTRAVENOUS | Status: DC | PRN
  Administered 2024-12-05: 130 ug/kg/min via INTRAVENOUS

## 2024-12-05 MED ORDER — PROPOFOL 1000 MG/100ML IV EMUL
INTRAVENOUS | Status: AC
  Filled 2024-12-05: qty 100

## 2024-12-05 MED ORDER — PROPOFOL 10 MG/ML IV BOLUS
INTRAVENOUS | Status: DC | PRN
  Administered 2024-12-05: 20 mg via INTRAVENOUS
  Administered 2024-12-05: 10 mg via INTRAVENOUS

## 2024-12-05 NOTE — Anesthesia Postprocedure Evaluation (Signed)
"   Anesthesia Post Note  Patient: [name]  Procedure(s) Performed: ULTRASOUND, UPPER GI TRACT, ENDOSCOPIC EGD (ESOPHAGOGASTRODUODENOSCOPY)     Patient location during evaluation: PACU Anesthesia Type: MAC Level of consciousness: awake and alert Pain management: pain level controlled Vital Signs Assessment: post-procedure vital signs reviewed and stable Respiratory status: spontaneous breathing, nonlabored ventilation and respiratory function stable Cardiovascular status: stable and blood pressure returned to baseline Anesthetic complications: no   No notable events documented.  Last Vitals:  Vitals:   12/05/24 0910 12/05/24 0920  BP: 119/71 116/71  Pulse: (!) 53 64  Resp: 17 18  Temp:    SpO2: 98% 98%    Last Pain:  Vitals:   12/05/24 0910  TempSrc:   PainSc: 7                  Debby FORBES Like      "

## 2024-12-05 NOTE — H&P (Signed)
 [facility] H/P Note  Chief Complaint: abdominal pain, abnormal MRI  HPI: [name] is an 59 y.o. female.  Here generalized abdominal pain and MRI possible abnormality (stone?) distal bile duct.  Past Medical History:  Diagnosis Date   Abscess    rectal   Anxiety    Arthritis    back; hips (11/14/2014)   Bipolar disorder (HCC)    Chronic back pain    all over   Chronic bronchitis (HCC)    I get it q yr (11/14/2014)   COPD (chronic obstructive pulmonary disease) (HCC)    Depression    Dyspnea    W/ EXERTION    Emphysema lung (HCC)    Endometriosis    Fibromyalgia    History of kidney stones    ILD (interstitial lung disease) (HCC) 11/2013   Migraine    3 in the last 2 wks (11/14/2014)   Ovarian cyst    Pulmonary infiltrates     Past Surgical History:  Procedure Laterality Date   ANTERIOR CERVICAL DECOMP/DISCECTOMY FUSION  10/03/2015   Procedure: ANTERIOR CERVICAL DISCECTOMY  AND FUSION C5-6 WITH PLATE AND SCREWS, LOCAL AND ALLOGRAFT BONE GRAFT;  Surgeon: Lynwood FORBES Better, MD;  Location: [facility] OR;  Service: Orthopedics;;   APPENDECTOMY  1993   BREAST BIOPSY Left 2008   BREAST LUMPECTOMY Left 2008   benign   CYST EXCISION PERINEAL Left 01/2012   thelbert 02/15/2012   EXAMINATION UNDER ANESTHESIA  02/15/2012   Procedure: EXAM UNDER ANESTHESIA;  Surgeon: Debby A. Cornett, MD;  Location: [facility];  Service: General;  Laterality: Left;  Excision of perineal cyst   FOOT SURGERY Left 08/17/2016   gastroc recession and plantar fascia release   LAPAROSCOPIC CHOLECYSTECTOMY  08/2007   LAPAROSCOPIC LYSIS OF ADHESIONS  06/2010   thelbert 06/18/2010   LEFT OOPHORECTOMY Left 08/2013   OVARIAN CYST REMOVAL  X 2-3   SHOULDER CLOSED REDUCTION Left 10/03/2015   Procedure: CLOSED MANIPULATION SHOULDER CLOSED  AND INJECTION WITH LOCAL ANESTHETIC;  Surgeon: Lynwood FORBES Better, MD;  Location: [facility] OR;  Service: Orthopedics;  Laterality: Left;   TRANSOBTURATOR SLING   09/2008   Transobturator mid urethral sling, cystoscopy, laparoscopy w/LOA/notes 03/04/2011   TUBAL LIGATION  1993   VAGINAL HYSTERECTOMY  1993   VIDEO BRONCHOSCOPY Bilateral 02/15/2014   Procedure: VIDEO BRONCHOSCOPY WITH FLUORO;  Surgeon: Carolynne Allan, MD;  Location: [facility];  Service: Cardiopulmonary;  Laterality: Bilateral;    Medications Prior to Admission  Medication Sig Dispense Refill   atorvastatin  (LIPITOR) 20 MG tablet Take 20 mg by mouth at bedtime.     budesonide -glycopyrrolate -formoterol  (BREZTRI) 160-9-4.8 MCG/ACT AERO inhaler Inhale 2 puffs into the lungs 2 (two) times daily as needed.     buPROPion  (WELLBUTRIN  XL) 300 MG 24 hr tablet Take 300 mg by mouth at bedtime.     carisoprodol  (SOMA ) 350 MG tablet Take 350 mg by mouth at bedtime.     clonazePAM  (KLONOPIN ) 1 MG tablet Take 1 mg by mouth 2 (two) times daily as needed.     dicyclomine  (BENTYL ) 20 MG tablet Take 20 mg by mouth 4 (four) times daily as needed for spasms.     hydrOXYzine  (ATARAX ) 25 MG tablet Take 25 mg by mouth in the morning, at noon, in the evening, and at bedtime.     LYBALVI  10-10 MG TABS Take 1 tablet by mouth at bedtime.     pregabalin  (LYRICA ) 200 MG capsule Take 200 mg by mouth  2 (two) times daily.     QUEtiapine  (SEROQUEL ) 400 MG tablet Take 400 mg by mouth at bedtime.     albuterol  (VENTOLIN  HFA) 108 (90 Base) MCG/ACT inhaler Inhale 2 puffs into the lungs 2 (two) times daily.     promethazine  (PHENERGAN ) 12.5 MG tablet Take 12.5-25 mg by mouth every 8 (eight) hours as needed.     SUMAtriptan  (IMITREX ) 20 MG/ACT nasal spray Place 20 mg into the nose every 2 (two) hours as needed for migraine or headache. May repeat in 2 hours if headache persists or recurs.     topiramate  (TOPAMAX ) 100 MG tablet Take 100 mg by mouth daily as needed (migraine).      Allergies: Allergies[1]  Family History  Problem Relation Age of Onset   Cancer Father        lung   COPD Father    Early death Father     Hypertension Sister    Cancer Maternal Aunt        LUNG CANCER   Other Neg Hx     Social History:  reports that she quit smoking about 7 years ago. Her smoking use included cigarettes. She started smoking about 39 years ago. She has a 16 pack-year smoking history. She has never used smokeless tobacco. She reports current drug use. Drug: Cocaine. She reports that she does not drink alcohol.   ROS: As per HPI, all others negative   Blood pressure 112/66, temperature 98.4 F (36.9 C), temperature source Temporal, resp. rate 15, height 5' 3 (1.6 m), weight 76.7 kg, SpO2 98%. General appearance: NAD HEENT:  [place]/AT, anicteric CV:  No tachycardia LUNGS:  No visible distress ABD:  Soft, mild generalized tenderness (chronic), no peritonitis NEURO:  No encephalopathy  No results found for this or any previous visit (from the past 48 hours). No results found.  Assessment/Plan   Abdominal pain. Abnormal MRI. Upper endoscopy as well as upper endoscopic ultrasound with possible fine needle aspiration (EGD, EUS +/- FNA). Risks (bleeding, infection, bowel perforation that could require surgery, sedation-related changes in cardiopulmonary systems), benefits (identification and possible treatment of source of symptoms, exclusion of certain causes of symptoms), and alternatives (watchful waiting, radiographic imaging studies, empiric medical treatment) of upper endoscopy and upper endoscopy with ultrasound and possible fine needle aspiration (EGD + EUS +/- FNA) were explained to patient/family in detail and patient wishes to proceed.   [name] 12/05/2024, 8:23 AM     [1]  Allergies Allergen Reactions   Corticosteroids Anaphylaxis, Swelling and Other (See Comments)    Other Reaction(s): felt like she was choking, swelling of throat and lips, SOB, difficulty breathing   Vicodin [Hydrocodone -Acetaminophen ] Hives, Itching and Other (See Comments)    Severe welts all over body, itching  without relief

## 2024-12-05 NOTE — Op Note (Addendum)
 [facility] Patient Name: [name] Procedure Date: 12/05/2024 MRN: [id number] Attending MD: Elsie Cree , MD, [id number] Date of Birth: November 09, 1965 CSN: [id number] Age: 59 Admit Type: Outpatient Procedure:                Upper EUS Indications:              Choledocholithiasis on MRCP, Generalized abdominal                            pain Providers:                Elsie Cree, MD, Ritta Debbie Alert, RN,                            Felice Sar, Technician Referring MD:              Medicines:                Monitored Anesthesia Care Complications:            No immediate complications. Estimated Blood Loss:     Estimated blood loss: none. Procedure:                Pre-Anesthesia Assessment:                           - Prior to the procedure, a History and Physical                            was performed, and patient medications and                            allergies were reviewed. The patient's tolerance of                            previous anesthesia was also reviewed. The risks                            and benefits of the procedure and the sedation                            options and risks were discussed with the patient.                            All questions were answered, and informed consent                            was obtained. Prior Anticoagulants: The patient has                            taken no anticoagulant or antiplatelet agents. ASA                            Grade Assessment: III - A patient with severe                            systemic disease. After  reviewing the risks and                            benefits, the patient was deemed in satisfactory                            condition to undergo the procedure.                           After obtaining informed consent, the endoscope was                            passed under direct vision. Throughout the                            procedure, the patient's blood  pressure, pulse, and                            oxygen saturations were monitored continuously. The                            [id number]-[id number] ([id number]) Olympus endosonoscope was                            introduced through the mouth, and advanced to the                            second part of duodenum. The [id number] ([id number])                            Olympus endoscope was introduced through the mouth,                            and advanced to the second part of duodenum. The                            upper EUS was accomplished without difficulty. The                            patient tolerated the procedure well. Scope In: Scope Out: Findings:      ENDOSCOPIC FINDING: :      The examined esophagus was normal.      A medium amount of food (residue) was found in the gastric body and in       the gastric antrum.      The exam of the stomach was otherwise normal.      Food (residue) was found in the duodenal bulb.      The exam of the duodenum was otherwise normal.      ENDOSONOGRAPHIC FINDING: :      There was no sign of significant endosonographic abnormality in the       ampulla.      There was no sign of significant endosonographic abnormality in the       pancreatic head, genu of the pancreas, pancreatic body, pancreatic tail       and uncinate  process of the pancreas. No masses, no cysts, no       calcifications, the pancreatic duct was well visualized from ampulla to       tail, the pancreatic duct was regular in contour.      There was no sign of significant endosonographic abnormality in the       ampulla. Normal bile duct. No bile duct stone.      One benign-appearing lymph node was visualized in the porta hepatis       region. The node was triangular, hypoechoic and had well defined       margins. Not readily accessible for EUS biopsy. Impression:               - Normal esophagus.                           - A medium amount of food (residue) in the stomach.                            - Retained food in the duodenum.                           - There was no sign of significant pathology in the                            ampulla.                           - Normal bile duct; no bile duct stone; nothing                            seen to correlate to MRI finding.                           - There was no sign of significant pathology in the                            pancreatic head, genu of the pancreas, pancreatic                            body, pancreatic tail and uncinate process of the                            pancreas.                           - There was no sign of significant pathology in the                            ampulla.                           - One benign lymph node was visualized in the porta                            hepatis region.                           -  No specimens collected. Moderate Sedation:      None Recommendation:           - Discharge patient to home (via wheelchair).                           - Resume previous diet today.                           - Continue present medications.                           - Return to GI clinic at appointment to be                            scheduled.                           - Consider repeat MRI 3-4 months for surveillance                            of benign-appearing lymph node, if symptoms persist.                           - Return to referring physician as previously                            scheduled. Procedure Code(s):        --- Professional ---                           [phone], Esophagogastroduodenoscopy, flexible,                            transoral; with endoscopic ultrasound examination                            limited to the esophagus, stomach or duodenum, and                            adjacent structures Diagnosis Code(s):        --- Professional ---                           I89.9, Noninfective disorder of lymphatic vessels                            and lymph  nodes, unspecified                           K80.50, Calculus of bile duct without cholangitis                            or cholecystitis without obstruction                           R10.84, Generalized abdominal pain CPT copyright 2022 [employer]. All rights reserved. The codes  documented in this report are preliminary and upon coder review may  be revised to meet current compliance requirements. Elsie Cree, MD 12/05/2024 9:05:45 AM This report has been signed electronically. Number of Addenda: 0

## 2024-12-05 NOTE — Discharge Instructions (Signed)
YOU HAD AN ENDOSCOPIC PROCEDURE TODAY: Refer to the procedure report and other information in the discharge instructions given to you for any specific questions about what was found during the examination. If this information does not answer your questions, please call [facility] office at [phone] to clarify.   YOU SHOULD EXPECT: Some feelings of bloating in the abdomen. Passage of more gas than usual. Walking can help get rid of the air that was put into your GI tract during the procedure and reduce the bloating. If you had a lower endoscopy (such as a colonoscopy or flexible sigmoidoscopy) you may notice spotting of blood in your stool or on the toilet paper. Some abdominal soreness may be present for a day or two, also.  DIET: Your first meal following the procedure should be a light meal and then it is ok to progress to your normal diet. A half-sandwich or bowl of soup is an example of a good first meal. Heavy or fried foods are harder to digest and may make you feel nauseous or bloated. Drink plenty of fluids but you should avoid alcoholic beverages for 24 hours. If you had a esophageal dilation, please see attached instructions for diet.    ACTIVITY: Your care partner should take you home directly after the procedure. You should plan to take it easy, moving slowly for the rest of the day. You can resume normal activity the day after the procedure however YOU SHOULD NOT DRIVE, use power tools, machinery or perform tasks that involve climbing or major physical exertion for 24 hours (because of the sedation medicines used during the test).   SYMPTOMS TO REPORT IMMEDIATELY: A gastroenterologist can be reached at any hour. Please call [phone]  for any of the following symptoms:  Following upper endoscopy (EGD, EUS, ERCP, esophageal dilation) Vomiting of blood or coffee ground material  New, significant abdominal pain  New, significant chest pain or pain under the shoulder blades  Painful or  persistently difficult swallowing  New shortness of breath  Black, tarry-looking or red, bloody stools  FOLLOW UP:  If any biopsies were taken you will be contacted by phone or by letter within the next 1-3 weeks. Call [phone]  if you have not heard about the biopsies in 3 weeks.  Please also call with any specific questions about appointments or follow up tests. YOU HAD AN ENDOSCOPIC PROCEDURE TODAY: Refer to the procedure report and other information in the discharge instructions given to you for any specific questions about what was found during the examination. If this information does not answer your questions, please call [facility] office at [phone] to clarify.   

## 2024-12-05 NOTE — Transfer of Care (Signed)
 Immediate Anesthesia Transfer of Care Note  Patient: [name]  Procedure(s) Performed: ULTRASOUND, UPPER GI TRACT, ENDOSCOPIC EGD (ESOPHAGOGASTRODUODENOSCOPY)  Patient Location: PACU and Endoscopy Unit  Anesthesia Type:MAC  Level of Consciousness: awake, alert , oriented, and patient cooperative  Airway & Oxygen Therapy: Patient Spontanous Breathing and Patient connected to face mask oxygen  Post-op Assessment: Report given to RN and Post -op Vital signs reviewed and stable  Post vital signs: Reviewed and stable  Last Vitals:  Vitals Value Taken Time  BP 102/58 12/05/24 09:00  Temp 36.5 C 12/05/24 09:00  Pulse 61 12/05/24 09:03  Resp 26 12/05/24 09:04  SpO2 98 % 12/05/24 09:03  Vitals shown include unfiled device data.  Last Pain:  Vitals:   12/05/24 0900  TempSrc: Temporal  PainSc:       Patients Stated Pain Goal: 8 (12/05/24 0729)  Complications: No notable events documented.

## 2024-12-06 ENCOUNTER — Encounter (HOSPITAL_COMMUNITY): Payer: Self-pay | Admitting: Gastroenterology
